# Patient Record
Sex: Female | Born: 1935
Health system: Southern US, Community
[De-identification: ages and names within clinical notes are randomized; demographics above are authoritative.]

## PROBLEM LIST (undated history)

## (undated) DIAGNOSIS — E785 Hyperlipidemia, unspecified: Secondary | ICD-10-CM

## (undated) DIAGNOSIS — R351 Nocturia: Secondary | ICD-10-CM

## (undated) DIAGNOSIS — E039 Hypothyroidism, unspecified: Secondary | ICD-10-CM

## (undated) DIAGNOSIS — Z973 Presence of spectacles and contact lenses: Secondary | ICD-10-CM

## (undated) DIAGNOSIS — C541 Malignant neoplasm of endometrium: Secondary | ICD-10-CM

## (undated) DIAGNOSIS — F419 Anxiety disorder, unspecified: Secondary | ICD-10-CM

## (undated) DIAGNOSIS — Z972 Presence of dental prosthetic device (complete) (partial): Secondary | ICD-10-CM

## (undated) DIAGNOSIS — Z923 Personal history of irradiation: Secondary | ICD-10-CM

## (undated) DIAGNOSIS — M51379 Other intervertebral disc degeneration, lumbosacral region without mention of lumbar back pain or lower extremity pain: Secondary | ICD-10-CM

## (undated) DIAGNOSIS — F32A Depression, unspecified: Secondary | ICD-10-CM

## (undated) DIAGNOSIS — F329 Major depressive disorder, single episode, unspecified: Secondary | ICD-10-CM

## (undated) DIAGNOSIS — M5137 Other intervertebral disc degeneration, lumbosacral region: Secondary | ICD-10-CM

## (undated) DIAGNOSIS — M1990 Unspecified osteoarthritis, unspecified site: Secondary | ICD-10-CM

## (undated) DIAGNOSIS — Z8719 Personal history of other diseases of the digestive system: Secondary | ICD-10-CM

## (undated) HISTORY — PX: INCISIONAL HERNIA REPAIR: SHX193

## (undated) HISTORY — DX: Personal history of irradiation: Z92.3

## (undated) HISTORY — PX: LAPAROSCOPIC CHOLECYSTECTOMY: SUR755

## (undated) HISTORY — PX: KNEE ARTHROSCOPY W/ MENISCAL REPAIR: SHX1877

## (undated) HISTORY — PX: CATARACT EXTRACTION W/ INTRAOCULAR LENS  IMPLANT, BILATERAL: SHX1307

---

## 1997-10-31 ENCOUNTER — Other Ambulatory Visit: Admission: RE | Admit: 1997-10-31 | Discharge: 1997-10-31 | Payer: Self-pay | Admitting: Endocrinology

## 1999-09-23 ENCOUNTER — Other Ambulatory Visit: Admission: RE | Admit: 1999-09-23 | Discharge: 1999-09-23 | Payer: Self-pay | Admitting: Endocrinology

## 2001-01-27 ENCOUNTER — Ambulatory Visit (HOSPITAL_COMMUNITY): Admission: RE | Admit: 2001-01-27 | Discharge: 2001-01-27 | Payer: Self-pay | Admitting: Gastroenterology

## 2001-03-01 ENCOUNTER — Encounter: Admission: RE | Admit: 2001-03-01 | Discharge: 2001-03-01 | Payer: Self-pay | Admitting: Surgery

## 2001-03-01 ENCOUNTER — Encounter: Payer: Self-pay | Admitting: Surgery

## 2001-03-02 ENCOUNTER — Ambulatory Visit (HOSPITAL_BASED_OUTPATIENT_CLINIC_OR_DEPARTMENT_OTHER): Admission: RE | Admit: 2001-03-02 | Discharge: 2001-03-02 | Payer: Self-pay | Admitting: Surgery

## 2007-04-16 ENCOUNTER — Encounter: Payer: Self-pay | Admitting: Pulmonary Disease

## 2007-05-12 ENCOUNTER — Ambulatory Visit: Payer: Self-pay | Admitting: Pulmonary Disease

## 2007-05-27 ENCOUNTER — Ambulatory Visit: Payer: Self-pay | Admitting: Pulmonary Disease

## 2007-05-27 DIAGNOSIS — R930 Abnormal findings on diagnostic imaging of skull and head, not elsewhere classified: Secondary | ICD-10-CM | POA: Insufficient documentation

## 2010-02-07 NOTE — Assessment & Plan Note (Signed)
Summary: consult for abnormal chest ct.   Referred by:  Evlyn Kanner PCP:  Ardyth Harps   History of Present Illness: the patient is a very pleasant 77 year old female who I have been asked to see for an abnormal chest CT.  Patient states that she had an episode of pneumonia the end of March/beginning of April and was treated appropriately with antibiotics.  She had a significant improvement in her condition, and has subsequently returned to her normal baseline.  During this time, the patient underwent a CT scan of the chest which did show cardiomegaly, as well as small pleural based densities that were primarily linear in nature.  Their appearance seemed most consistent with a scar.  The patient currently has no significant cough or mucus production.  She is very satisfied with her exercise tolerance.  She denies any history of occupational exposures, nor has she ever lived in the [place] for any significant time period.  She has never smoked.     Current Allergies: ! CODEINE ! SULFA ! ADHESIVE TAPE ! * LATEX  Past Medical History:    status post cholecystectomy    history of hernia surgery   Family History:    Reviewed history from 05/12/2007 and no changes required:       heart disease: father (MI), brother (stroke), mother (stroke)  Social History:    Reviewed history from 05/12/2007 and no changes required:       Patient never smoked.        pt is married.       pt is retired from [employer]   Risk Factors: Tobacco use:  never   Review of Systems      See HPI   Vital Signs:  Patient Profile:   77 Years Old Female Weight:      172.38 pounds O2 Sat:      97 % O2 treatment:    Room Air Temp:     97.6 degrees F oral Pulse rate:   67 / minute BP sitting:   112 / 78  (left arm) Cuff size:   regular  Vitals Entered By: Cyndia Diver LPN (May 27, 2007 1:44 PM)             Comments Medications reviewed with patient Cyndia Diver LPN  May 27, 2007 1:44 PM       Physical Exam  General:     well developed female in no acute distress Eyes:     slightly irregular,EOMI.   Nose:     patent without discharge Mouth:     clear Neck:     no JVD, thyromegaly, or lymphadenopathy. Lungs:     totally clear to auscultation Heart:     regular rate and rhythm, no MRG Abdomen:     soft and nontender, bowel sounds present Extremities:     no significant edema, pulses intact distally Neurologic:     alert and oriented, moves all 4 extremities.     Impression & Recommendations:  Problem # 1:  CT, CHEST, ABNORMAL (ICD-793.1) the patient has very minimal changes in a few areas of both lung fields, and they are most compatible with scarring.  I do not see any definite nodules or infiltrates.  There is no lymphadenopathy.  The patient has no residual pulmonary symptoms, and feels that her breathing is at an excellent baseline.  At this point, I would not be concerned about her CAT scan, and would not follow up unless her clinical situation  changes.   Patient Instructions: 1)  no new recommendations. 2)  Follow-up p.r.n.   ]

## 2010-04-28 ENCOUNTER — Emergency Department (HOSPITAL_COMMUNITY)
Admission: EM | Admit: 2010-04-28 | Discharge: 2010-04-28 | Disposition: A | Discharge status: Home / Self Care | Payer: Medicare Other | Attending: Emergency Medicine | Admitting: Emergency Medicine

## 2010-04-28 DIAGNOSIS — E039 Hypothyroidism, unspecified: Secondary | ICD-10-CM | POA: Insufficient documentation

## 2010-04-28 DIAGNOSIS — R509 Fever, unspecified: Secondary | ICD-10-CM | POA: Insufficient documentation

## 2010-04-28 DIAGNOSIS — N390 Urinary tract infection, site not specified: Secondary | ICD-10-CM | POA: Insufficient documentation

## 2010-04-28 LAB — DIFFERENTIAL
Basophils Absolute: 0 10*3/uL (ref 0.0–0.1)
Basophils Relative: 0 % (ref 0–1)
Eosinophils Absolute: 0 10*3/uL (ref 0.0–0.7)
Eosinophils Relative: 0 % (ref 0–5)
Lymphocytes Relative: 4 % — ABNORMAL LOW (ref 12–46)
Lymphs Abs: 0.4 10*3/uL — ABNORMAL LOW (ref 0.7–4.0)
Monocytes Absolute: 0.7 10*3/uL (ref 0.1–1.0)
Monocytes Relative: 6 % (ref 3–12)
Neutro Abs: 10.2 10*3/uL — ABNORMAL HIGH (ref 1.7–7.7)
Neutrophils Relative %: 90 % — ABNORMAL HIGH (ref 43–77)

## 2010-04-28 LAB — COMPREHENSIVE METABOLIC PANEL
ALT: 26 U/L (ref 0–35)
AST: 40 U/L — ABNORMAL HIGH (ref 0–37)
Albumin: 3.4 g/dL — ABNORMAL LOW (ref 3.5–5.2)
Alkaline Phosphatase: 48 U/L (ref 39–117)
BUN: 11 mg/dL (ref 6–23)
CO2: 28 mEq/L (ref 19–32)
Calcium: 8.7 mg/dL (ref 8.4–10.5)
Chloride: 103 mEq/L (ref 96–112)
Creatinine, Ser: 0.96 mg/dL (ref 0.4–1.2)
GFR calc Af Amer: >60 mL/min (ref >60)
GFR calc non Af Amer: 56 mL/min — ABNORMAL LOW (ref >60)
Glucose, Bld: 169 mg/dL — ABNORMAL HIGH (ref 70–99)
Potassium: 3.8 mEq/L (ref 3.5–5.1)
Sodium: 139 mEq/L (ref 135–145)
Total Bilirubin: 0.9 mg/dL (ref 0.3–1.2)
Total Protein: 6.4 g/dL (ref 6.0–8.3)

## 2010-04-28 LAB — URINALYSIS, ROUTINE W REFLEX MICROSCOPIC
Bilirubin Urine: NEGATIVE
Glucose, UA: NEGATIVE mg/dL
Ketones, ur: NEGATIVE mg/dL
Nitrite: POSITIVE — AB
Protein, ur: 30 mg/dL — AB
Specific Gravity, Urine: 1.015 (ref 1.005–1.030)
Urobilinogen, UA: 0.2 mg/dL (ref 0.0–1.0)
pH: 6 (ref 5.0–8.0)

## 2010-04-28 LAB — CBC
HCT: 36 % (ref 36.0–46.0)
Hemoglobin: 12.2 g/dL (ref 12.0–15.0)
MCH: 31.5 pg (ref 26.0–34.0)
MCHC: 33.9 g/dL (ref 30.0–36.0)
MCV: 93 fL (ref 78.0–100.0)
Platelets: 125 10*3/uL — ABNORMAL LOW (ref 150–400)
RBC: 3.87 MIL/uL (ref 3.87–5.11)
RDW: 13.2 % (ref 11.5–15.5)
WBC: 11.4 10*3/uL — ABNORMAL HIGH (ref 4.0–10.5)

## 2010-04-28 LAB — URINE MICROSCOPIC-ADD ON

## 2010-05-01 LAB — URINE CULTURE
Colony Count: >=100000
Culture  Setup Time: 201204222148

## 2010-05-24 NOTE — Procedures (Signed)
[place]. [facility]  Patient:    Traci Richardson, Traci Richardson Visit Number: [id number] MRN: [id number]          Service Type: END Location: ENDO Attending Physician:  Charna Elizabeth Dictated by:   Anselmo Rod, M.D. Proc. Date: 01/27/01 Admit Date:  01/27/2001   CC:         [name], M.D.   Procedure Report  DATE OF BIRTH:  1925-03-13  REFERRING PHYSICIAN:  Tera Mater. Evlyn Kanner, M.D.  PROCEDURE PERFORMED:  Screening colonoscopy.  ENDOSCOPIST:  Anselmo Rod, M.D.  INSTRUMENT USED:  Olympus pediatric video colonoscope.  INDICATIONS FOR PROCEDURE:  The patient is a 77 year old white female undergoing colorectal cancer screening.  Rule out colonic polyps, masses, hemorrhoids, etc.  PREPROCEDURE PREPARATION:  Informed consent was procured from the patient. The patient was fasted for eight hours prior to the procedure and prepped with a bottle of magnesium citrate and a gallon of NuLytely the night prior to the procedure.  PREPROCEDURE PHYSICAL:  The patient had stable vital signs.  Neck supple. Chest clear to auscultation.  S1, S2 regular.  Abdomen soft with normal bowel sounds.  DESCRIPTION OF PROCEDURE:  The patient was placed in the left lateral decubitus position and sedated with 50 mg of Demerol and 5 mg of Versed intravenously.  Once the patient was adequately sedated and maintained on low-flow oxygen and continuous cardiac monitoring, the Olympus video colonoscope was advanced from the rectum to the cecum without difficulty. Except for small nonbleeding internal and external hemorrhoids, no other abnormalities were noted.  The procedure was completed up to cecum, the ileocecal valve and the appendicular orifice were clearly visualized and photographed.  No masses, polyps, erosions, ulcerations or diverticula were appreciated.  IMPRESSION:  Healthy-appearing colon up to the cecum except for small nonbleeding internal and external  hemorrhoids.  RECOMMENDATIONS: 1. High fiber diet. 2. Repeat colorectal cancer screening in the next five to 10 years unless    the patient were to develop any abnormal symptoms in the interim. 3. Outpatient follow-up on a p.r.n. basis.Dictated by:   Anselmo Rod, M.D.  Attending Physician:  Charna Elizabeth DD:  01/27/01 TD:  01/27/01 Job: [id number]

## 2010-05-24 NOTE — Op Note (Signed)
[place]. [facility]  Patient:    Traci Richardson, Traci Richardson Visit Number: [id number] MRN: [id number]          Service Type: DSU Location: [facility] Attending Physician:  Shelly Rubenstein Dictated by:   Abigail Miyamoto, M.D. Proc. Date: 03/02/01 Admit Date:  03/02/2001                             Operative Report  PREOPERATIVE DIAGNOSIS:  Incisional hernia.  POSTOPERATIVE DIAGNOSIS:  Incisional hernia.  OPERATION PERFORMED:  Incisional hernia repair with mesh.  SURGEON:  Abigail Miyamoto, M.D.  ANESTHESIA:  General endotracheal and 1% lidocaine.  ESTIMATED BLOOD LOSS:  Minimal.  INDICATIONS FOR PROCEDURE:  Traci Richardson is a 77 year old female who presents with a small hernia below the umbilicus.  She has had a previous incision here from laparoscopy.  Decision was made to proceed with the incisional hernia repair.  DESCRIPTION OF PROCEDURE:  The patient was brought to the operating room and identified as Traci Richardson.  She was placed supine on the operating table and anesthesia was induced.  Her abdomen was then prepped and draped in the usual sterile fashion.  The skin underlying the umbilicus was anesthetized with 1% lidocaine.  A small vertical incision was made below the umbilicus through the patients previous scar.  Incision was taken down to the hernia sac which was easily identified.  The hernia sac was then completely excised.  The fascia was then examined and another hernia was detected just above this defect at the area of the umbilicus.  The skin incision therefore had to be taken above the umbilicus.  The small bridge of fascia connecting the two hernia defects was then transected.  The larger defect was then identified.  There were no adhesions to the hernia defect.  This piece of medium-sized piece of Ventralex mesh which was 6.4 cm circle was brought onto the field.  The mesh was placed through the hernia defect and then sewn in place in four  different locations to the ring of the mesh with 0 Ethibond sutures.  The fascia was then closed over the top of the mesh with interrupted 0 Ethibond sutures as well.  Good coverage of the hernia defect with the mesh and closure of the fascia was achieved.  The wound was then irrigated with saline.  Again it was anesthetized with 1% lidocaine.  The subcutaneous layer was then closed with interrupted 3-0 Vicryl sutures and the skin was closed with running 4-0 Vicryl suture.  Steri-Strips, gauze and Tegaderm were then applied.  The patient tolerated the procedure well.  Sponge, needle and instrument counts were correct at the end of the procedure.  The patient was then extubated in the operating room and taken in stable condition to the recovery room. Dictated by:   Abigail Miyamoto, M.D. Attending Physician:  Shelly Rubenstein DD:  03/02/01 TD:  03/02/01 Job: [id number]

## 2011-01-15 DIAGNOSIS — M1710 Unilateral primary osteoarthritis, unspecified knee: Secondary | ICD-10-CM | POA: Diagnosis not present

## 2011-01-15 DIAGNOSIS — IMO0002 Reserved for concepts with insufficient information to code with codable children: Secondary | ICD-10-CM | POA: Diagnosis not present

## 2011-01-23 DIAGNOSIS — IMO0002 Reserved for concepts with insufficient information to code with codable children: Secondary | ICD-10-CM | POA: Diagnosis not present

## 2011-01-29 DIAGNOSIS — IMO0002 Reserved for concepts with insufficient information to code with codable children: Secondary | ICD-10-CM | POA: Diagnosis not present

## 2011-01-31 ENCOUNTER — Emergency Department (HOSPITAL_COMMUNITY)
Admission: EM | Admit: 2011-01-31 | Discharge: 2011-01-31 | Disposition: A | Discharge status: Home / Self Care | Payer: Medicare Other | Attending: Emergency Medicine | Admitting: Emergency Medicine

## 2011-01-31 ENCOUNTER — Encounter (HOSPITAL_COMMUNITY): Payer: Self-pay | Admitting: *Deleted

## 2011-01-31 DIAGNOSIS — M79609 Pain in unspecified limb: Secondary | ICD-10-CM | POA: Diagnosis not present

## 2011-01-31 DIAGNOSIS — M7989 Other specified soft tissue disorders: Secondary | ICD-10-CM

## 2011-01-31 DIAGNOSIS — E039 Hypothyroidism, unspecified: Secondary | ICD-10-CM | POA: Diagnosis not present

## 2011-01-31 DIAGNOSIS — I119 Hypertensive heart disease without heart failure: Secondary | ICD-10-CM | POA: Diagnosis not present

## 2011-01-31 HISTORY — DX: Hypothyroidism, unspecified: E03.9

## 2011-01-31 LAB — DIFFERENTIAL
Basophils Absolute: 0 10*3/uL (ref 0.0–0.1)
Basophils Relative: 1 % (ref 0–1)
Eosinophils Absolute: 0.3 10*3/uL (ref 0.0–0.7)
Eosinophils Relative: 4 % (ref 0–5)
Lymphocytes Relative: 25 % (ref 12–46)
Lymphs Abs: 1.7 10*3/uL (ref 0.7–4.0)
Monocytes Absolute: 0.5 10*3/uL (ref 0.1–1.0)
Monocytes Relative: 7 % (ref 3–12)
Neutro Abs: 4.2 10*3/uL (ref 1.7–7.7)
Neutrophils Relative %: 64 % (ref 43–77)

## 2011-01-31 LAB — CBC
HCT: 35.7 % — ABNORMAL LOW (ref 36.0–46.0)
Hemoglobin: 12 g/dL (ref 12.0–15.0)
MCH: 31.1 pg (ref 26.0–34.0)
MCHC: 33.6 g/dL (ref 30.0–36.0)
MCV: 92.5 fL (ref 78.0–100.0)
Platelets: 151 10*3/uL (ref 150–400)
RBC: 3.86 MIL/uL — ABNORMAL LOW (ref 3.87–5.11)
RDW: 13.2 % (ref 11.5–15.5)
WBC: 6.6 10*3/uL (ref 4.0–10.5)

## 2011-01-31 LAB — PROTIME-INR
INR: 1 (ref 0.00–1.49)
Prothrombin Time: 13.4 seconds (ref 11.6–15.2)

## 2011-01-31 MED ORDER — ENOXAPARIN SODIUM 80 MG/0.8ML ~~LOC~~ SOLN
1.0000 mg/kg | Freq: Once | SUBCUTANEOUS | Status: AC
  Administered 2011-01-31: 23:00:00 via SUBCUTANEOUS
  Filled 2011-01-31: qty 0.8

## 2011-01-31 NOTE — ED Provider Notes (Signed)
History     CSN: [id number]  Arrival date & time 01/31/11  1850   First MD Initiated Contact with Patient 01/31/11 2034      Chief Complaint  Patient presents with  . Leg Pain     HPI Pt reports L knee pain x 3 weeks, has seen Dr. Jeannetta Ellis PA and was given prednisone. She also reports having an MRI done Wednesday. Pt reports pain is radiating down to her Leg and noticed swelling today. Pt also reports a small round reddened area on her LLE.   Past Medical History  Diagnosis Date  . Hypothyroidism     History reviewed. No pertinent past surgical history.  No family history on file.  History  Substance Use Topics  . Smoking status: Never Smoker   . Smokeless tobacco: Not on file  . Alcohol Use: No    OB History    Grav Para Term Preterm Abortions TAB SAB Ect Mult Living                  Review of Systems Review of systems negative except as noted in history of present illness Allergies  Sulfonamide derivatives; Codeine; and Latex  Home Medications   Current Outpatient Rx  Name Route Sig Dispense Refill  . EZETIMIBE-SIMVASTATIN 10-40 MG PO TABS Oral Take 1 tablet by mouth at bedtime.    Marland Kitchen LEVOTHYROXINE SODIUM 88 MCG PO TABS Oral Take 88 mcg by mouth daily.    . ADULT MULTIVITAMIN W/MINERALS CH Oral Take 1 tablet by mouth daily.    Marland Kitchen NAPROXEN SODIUM 220 MG PO TABS Oral Take 220 mg by mouth 2 (two) times daily with a meal.    . TRAMADOL HCL 50 MG PO TABS Oral Take 50 mg by mouth every 4 (four) hours as needed. pain      BP 135/53  Pulse 64  Temp(Src) 98.6 F (37 C) (Oral)  Resp 16  Ht 5\' 3"  (1.6 m)  Wt 170 lb (77.111 kg)  BMI 30.11 kg/m2  SpO2 96%  Physical Exam  Nursing note and vitals reviewed. Constitutional: She is oriented to person, place, and time. She appears well-developed and well-nourished. No distress.  HENT:  Head: Normocephalic and atraumatic.  Eyes: Pupils are equal, round, and reactive to light.  Neck: Normal range of motion.    Cardiovascular: Normal rate and intact distal pulses.   Pulmonary/Chest: No respiratory distress.  Abdominal: Normal appearance. She exhibits no distension.  Musculoskeletal: Normal range of motion.       Left upper leg: She exhibits swelling.  Neurological: She is alert and oriented to person, place, and time. No cranial nerve deficit.  Skin: Skin is warm and dry. No rash noted.  Psychiatric: She has a normal mood and affect. Her behavior is normal.    ED Course  Procedures (including critical care time)  Labs Reviewed  CBC - Abnormal; Notable for the following:    RBC 3.86 (*)    HCT 35.7 (*)    All other components within normal limits  DIFFERENTIAL  PROTIME-INR   No results found.   1. Leg swelling       MDM   Plan at this time is to give the patient injection of Lovenox and bring her back tomorrow for a venous Doppler ultrasound of the leg.  If the venous Doppler is negative and the swelling is most likely secondary to her knee problem.  If positive she should be treated accordingly.  Nelia Shi, MD 01/31/11 2253

## 2011-01-31 NOTE — ED Notes (Signed)
Pt reports L knee pain x 3 weeks, has seen Dr. Jeannetta Ellis PA and was given prednisone.  She also reports having an MRI done Wednesday.  Pt reports pain is radiating down to her Leg and noticed swelling today.  Pt also reports a small round reddened area on her LLE.

## 2011-02-01 ENCOUNTER — Ambulatory Visit (HOSPITAL_COMMUNITY)
Admission: RE | Admit: 2011-02-01 | Discharge: 2011-02-01 | Disposition: A | Discharge status: Home / Self Care | Payer: Medicare Other | Source: Ambulatory Visit | Attending: Emergency Medicine | Admitting: Emergency Medicine

## 2011-02-01 DIAGNOSIS — M79609 Pain in unspecified limb: Secondary | ICD-10-CM | POA: Diagnosis not present

## 2011-02-01 DIAGNOSIS — M7989 Other specified soft tissue disorders: Secondary | ICD-10-CM | POA: Diagnosis not present

## 2011-02-01 NOTE — Progress Notes (Signed)
VASCULAR LAB PRELIMINARY  PRELIMINARY  PRELIMINARY  PRELIMINARY  Left lower extremity venous duplex completed.    Preliminary report:  Left:  No evidence of DVT or superficial thrombosis.  Small Baker's cyst noted in popliteal fossa measuring approx 2.4 x 1.8 x 1.6 cm.  Interstitial fluid also noted in calf.   Terance Hart, RVT 02/01/2011, 10:59 AM

## 2011-02-03 DIAGNOSIS — IMO0002 Reserved for concepts with insufficient information to code with codable children: Secondary | ICD-10-CM | POA: Diagnosis not present

## 2011-02-14 DIAGNOSIS — M23329 Other meniscus derangements, posterior horn of medial meniscus, unspecified knee: Secondary | ICD-10-CM | POA: Diagnosis not present

## 2011-02-14 DIAGNOSIS — M1710 Unilateral primary osteoarthritis, unspecified knee: Secondary | ICD-10-CM | POA: Diagnosis not present

## 2011-02-14 DIAGNOSIS — M2240 Chondromalacia patellae, unspecified knee: Secondary | ICD-10-CM | POA: Diagnosis not present

## 2011-02-14 DIAGNOSIS — M23305 Other meniscus derangements, unspecified medial meniscus, unspecified knee: Secondary | ICD-10-CM | POA: Diagnosis not present

## 2011-02-14 DIAGNOSIS — M659 Synovitis and tenosynovitis, unspecified: Secondary | ICD-10-CM | POA: Diagnosis not present

## 2011-02-14 DIAGNOSIS — M9420 Chondromalacia, unspecified site: Secondary | ICD-10-CM | POA: Diagnosis not present

## 2011-03-17 DIAGNOSIS — L820 Inflamed seborrheic keratosis: Secondary | ICD-10-CM | POA: Diagnosis not present

## 2011-03-17 DIAGNOSIS — L821 Other seborrheic keratosis: Secondary | ICD-10-CM | POA: Diagnosis not present

## 2011-03-17 DIAGNOSIS — L723 Sebaceous cyst: Secondary | ICD-10-CM | POA: Diagnosis not present

## 2011-03-19 ENCOUNTER — Ambulatory Visit (HOSPITAL_COMMUNITY)
Admission: RE | Admit: 2011-03-19 | Discharge: 2011-03-19 | Disposition: A | Discharge status: Home / Self Care | Payer: Medicare Other | Source: Ambulatory Visit | Attending: Orthopedic Surgery | Admitting: Orthopedic Surgery

## 2011-03-19 DIAGNOSIS — M7989 Other specified soft tissue disorders: Secondary | ICD-10-CM | POA: Insufficient documentation

## 2011-03-19 DIAGNOSIS — M79609 Pain in unspecified limb: Secondary | ICD-10-CM | POA: Insufficient documentation

## 2011-03-19 DIAGNOSIS — M79604 Pain in right leg: Secondary | ICD-10-CM

## 2011-03-19 NOTE — Progress Notes (Signed)
Left lower extremity venous duplex completed.  Preliminary report is negative for DVT, SVT, or a Baker's cyst in the left leg.  Negative for DVT in the right common femoral vein. 

## 2011-03-20 DIAGNOSIS — M1710 Unilateral primary osteoarthritis, unspecified knee: Secondary | ICD-10-CM | POA: Diagnosis not present

## 2011-03-20 DIAGNOSIS — IMO0002 Reserved for concepts with insufficient information to code with codable children: Secondary | ICD-10-CM | POA: Diagnosis not present

## 2011-03-25 DIAGNOSIS — IMO0002 Reserved for concepts with insufficient information to code with codable children: Secondary | ICD-10-CM | POA: Diagnosis not present

## 2011-03-25 DIAGNOSIS — M1710 Unilateral primary osteoarthritis, unspecified knee: Secondary | ICD-10-CM | POA: Diagnosis not present

## 2011-04-01 DIAGNOSIS — IMO0002 Reserved for concepts with insufficient information to code with codable children: Secondary | ICD-10-CM | POA: Diagnosis not present

## 2011-04-01 DIAGNOSIS — M1710 Unilateral primary osteoarthritis, unspecified knee: Secondary | ICD-10-CM | POA: Diagnosis not present

## 2011-04-08 DIAGNOSIS — IMO0002 Reserved for concepts with insufficient information to code with codable children: Secondary | ICD-10-CM | POA: Diagnosis not present

## 2011-04-08 DIAGNOSIS — M1710 Unilateral primary osteoarthritis, unspecified knee: Secondary | ICD-10-CM | POA: Diagnosis not present

## 2011-04-14 DIAGNOSIS — IMO0002 Reserved for concepts with insufficient information to code with codable children: Secondary | ICD-10-CM | POA: Diagnosis not present

## 2011-04-14 DIAGNOSIS — M1710 Unilateral primary osteoarthritis, unspecified knee: Secondary | ICD-10-CM | POA: Diagnosis not present

## 2011-04-21 DIAGNOSIS — E785 Hyperlipidemia, unspecified: Secondary | ICD-10-CM | POA: Diagnosis not present

## 2011-04-21 DIAGNOSIS — M1710 Unilateral primary osteoarthritis, unspecified knee: Secondary | ICD-10-CM | POA: Diagnosis not present

## 2011-04-21 DIAGNOSIS — IMO0002 Reserved for concepts with insufficient information to code with codable children: Secondary | ICD-10-CM | POA: Diagnosis not present

## 2011-04-21 DIAGNOSIS — E039 Hypothyroidism, unspecified: Secondary | ICD-10-CM | POA: Diagnosis not present

## 2011-04-21 DIAGNOSIS — E559 Vitamin D deficiency, unspecified: Secondary | ICD-10-CM | POA: Diagnosis not present

## 2011-04-27 ENCOUNTER — Encounter (HOSPITAL_COMMUNITY): Payer: Self-pay

## 2011-04-27 ENCOUNTER — Emergency Department (HOSPITAL_COMMUNITY)
Admission: EM | Admit: 2011-04-27 | Discharge: 2011-04-27 | Disposition: A | Discharge status: Home / Self Care | Payer: Medicare Other | Attending: Emergency Medicine | Admitting: Emergency Medicine

## 2011-04-27 DIAGNOSIS — K047 Periapical abscess without sinus: Secondary | ICD-10-CM | POA: Diagnosis not present

## 2011-04-27 DIAGNOSIS — R6889 Other general symptoms and signs: Secondary | ICD-10-CM | POA: Insufficient documentation

## 2011-04-27 DIAGNOSIS — E039 Hypothyroidism, unspecified: Secondary | ICD-10-CM | POA: Insufficient documentation

## 2011-04-27 DIAGNOSIS — K029 Dental caries, unspecified: Secondary | ICD-10-CM | POA: Diagnosis not present

## 2011-04-27 LAB — URINALYSIS, ROUTINE W REFLEX MICROSCOPIC
Bilirubin Urine: NEGATIVE
Glucose, UA: NEGATIVE mg/dL
Hgb urine dipstick: NEGATIVE
Ketones, ur: NEGATIVE mg/dL
Leukocytes, UA: NEGATIVE
Nitrite: NEGATIVE
Protein, ur: NEGATIVE mg/dL
Specific Gravity, Urine: 1.022 (ref 1.005–1.030)
Urobilinogen, UA: 0.2 mg/dL (ref 0.0–1.0)
pH: 6 (ref 5.0–8.0)

## 2011-04-27 MED ORDER — IBUPROFEN 200 MG PO TABS
400.0000 mg | ORAL_TABLET | Freq: Once | ORAL | Status: AC
  Administered 2011-04-27: 400 mg via ORAL
  Filled 2011-04-27: qty 2

## 2011-04-27 NOTE — ED Provider Notes (Signed)
History     CSN: [id number]  Arrival date & time 04/27/11  1804   First MD Initiated Contact with Patient 04/27/11 1907      Chief Complaint  Patient presents with  . Dental Pain    (Consider location/radiation/quality/duration/timing/severity/associated sxs/prior treatment) Patient is a 78 y.o. female presenting with tooth pain. The history is provided by the patient and a relative.  Dental Pain  Patient complaining of dental pain to the right lower jaw x24 hours. Called her dentist and she was prescribed amoxicillin and the pain remains however the right lower jaw swelling has improved. She now has a low-grade temperature has been using Tylenol with some relief. Family brings her in today concerned about the increased temperature as well as for possible concurrent UTI. Patient notes some urinary urgency and frequency, denies flank pain or vomiting Past Medical History  Diagnosis Date  . Hypothyroidism     History reviewed. No pertinent past surgical history.  No family history on file.  History  Substance Use Topics  . Smoking status: Never Smoker   . Smokeless tobacco: Not on file  . Alcohol Use: No    OB History    Grav Para Term Preterm Abortions TAB SAB Ect Mult Living                  Review of Systems  All other systems reviewed and are negative.    Allergies  Sulfonamide derivatives; Codeine; and Latex  Home Medications   Current Outpatient Rx  Name Route Sig Dispense Refill  . ACETAMINOPHEN 500 MG PO TABS Oral Take 1,000 mg by mouth every 6 (six) hours as needed. pain    . AMOXICILLIN 500 MG PO CAPS Oral Take 500 mg by mouth 4 (four) times daily.    Marland Kitchen EZETIMIBE-SIMVASTATIN 10-40 MG PO TABS Oral Take 1 tablet by mouth at bedtime.    Marland Kitchen LEVOTHYROXINE SODIUM 88 MCG PO TABS Oral Take 88 mcg by mouth daily.    . ADULT MULTIVITAMIN W/MINERALS CH Oral Take 1 tablet by mouth daily.    . TRAMADOL HCL 50 MG PO TABS Oral Take 50 mg by mouth every 4 (four) hours  as needed. pain      BP 128/71  Pulse 73  Temp(Src) 100.2 F (37.9 C) (Oral)  Resp 16  SpO2 99%  Physical Exam  Nursing note and vitals reviewed. Constitutional: She is oriented to person, place, and time. She appears well-developed and well-nourished.  Non-toxic appearance. No distress.  HENT:  Head: Normocephalic and atraumatic.  Mouth/Throat: Dental caries present.  Eyes: Conjunctivae, EOM and lids are normal. Pupils are equal, round, and reactive to light.  Neck: Normal range of motion. Neck supple. No tracheal deviation present. No mass present.  Cardiovascular: Normal rate, regular rhythm and normal heart sounds.  Exam reveals no gallop.   No murmur heard. Pulmonary/Chest: Effort normal and breath sounds normal. No stridor. No respiratory distress. She has no decreased breath sounds. She has no wheezes. She has no rhonchi. She has no rales.  Abdominal: Soft. Normal appearance and bowel sounds are normal. She exhibits no distension. There is no tenderness. There is no rebound and no CVA tenderness.  Musculoskeletal: Normal range of motion. She exhibits no edema and no tenderness.  Neurological: She is alert and oriented to person, place, and time. She has normal strength. No cranial nerve deficit or sensory deficit. GCS eye subscore is 4. GCS verbal subscore is 5. GCS motor subscore is 6.  Skin: Skin is warm and dry. No abrasion and no rash noted.  Psychiatric: She has a normal mood and affect. Her speech is normal and behavior is normal.    ED Course  Procedures (including critical care time)   Labs Reviewed  URINALYSIS, ROUTINE W REFLEX MICROSCOPIC  URINE CULTURE   No results found.   No diagnosis found.    MDM  Patient given Motrin for her temperature and was much better now. Urinalysis negative. Patient will see her dentist tomorrow morning. Patient's jaw swelling and facial erythema had greatly improved according to the family therefore her antibiotics would not  be changed        Toy Baker, MD 04/27/11 2150

## 2011-04-27 NOTE — ED Notes (Signed)
Patient given discharge instructions, information, prescriptions, and diet order. Patient states that they adequately understand discharge information given and to return to ED if symptoms return or worsen.     

## 2011-04-27 NOTE — ED Notes (Signed)
Pt in from home with c/o dental pain currently taking antibiotic prescribed by pcp states pain and discomfort is no better pt c/o lower right tooth abscess

## 2011-04-27 NOTE — Discharge Instructions (Signed)
Follow up with your dentist tomorrow. Return here at once for trouble swallowing, vomiting, worsening fever, or any other problems Abscessed Tooth A tooth abscess is a collection of infected fluid (pus) from a bacterial infection in the inner part of the tooth (pulp). It usually occurs at the end of the tooth's root.  CAUSES   A very bad cavity (extensive tooth decay).   Trauma to the tooth, such as a broken or chipped tooth, that allows bacteria to enter into the pulp.  SYMPTOMS  Severe pain in and around the infected tooth.   Swelling and redness around the abscessed tooth or in the mouth or face.   Tenderness.   Pus drainage.   Bad breath.   Bitter taste in the mouth.   Difficulty swallowing.   Difficulty opening the mouth.   Feeling sick to your stomach (nauseous).   Vomiting.   Chills.   Swollen neck glands.  DIAGNOSIS  A medical and dental history will be taken.   An examination will be performed by tapping on the abscessed tooth.   X-rays may be taken of the tooth to identify the abscess.  TREATMENT The goal of treatment is to eliminate the infection.   You may be prescribed antibiotic medicine to stop the infection from spreading.   A root canal may be performed to save the tooth. If the tooth cannot be saved, it may be pulled (extracted) and the abscess may be drained.  HOME CARE INSTRUCTIONS  Only take over-the-counter or prescription medicines for pain, fever, or discomfort as directed by your caregiver.   Do not drive after taking pain medicine (narcotics).   Rinse your mouth (gargle) often with salt water ( tsp salt in 8 oz of warm water) to relieve pain or swelling.   Do not apply heat to the outside of your face.   Return to your dentist for further treatment as directed.  SEEK IMMEDIATE DENTAL CARE IF:  You have a temperature by mouth above 102 F (38.9 C), not controlled by medicine.   You have chills or a very bad headache.   You have  problems breathing or swallowing.   Your have trouble opening your mouth.   You develop swelling in the neck or around the eye.   Your pain is not helped by medicine.   Your pain is getting worse instead of better.  Document Released: 12/23/2004 Document Revised: 12/12/2010 Document Reviewed: 04/02/2010 [facility] Patient Information 2012 [place], [place].

## 2011-04-27 NOTE — ED Notes (Signed)
Family also states patient has been very week with fever all day tylenol given 1000mg  at [facility]

## 2011-04-27 NOTE — ED Notes (Signed)
Bed:WA22<BR> Expected date:<BR> Expected time:<BR> Means of arrival:<BR> Comments:<BR> Hold for triage 2

## 2011-04-29 ENCOUNTER — Encounter (HOSPITAL_COMMUNITY): Payer: Self-pay | Admitting: *Deleted

## 2011-04-29 ENCOUNTER — Emergency Department (HOSPITAL_COMMUNITY): Payer: Medicare Other

## 2011-04-29 ENCOUNTER — Emergency Department (HOSPITAL_COMMUNITY)
Admission: EM | Admit: 2011-04-29 | Discharge: 2011-04-29 | Disposition: A | Discharge status: Home / Self Care | Payer: Medicare Other | Attending: Emergency Medicine | Admitting: Emergency Medicine

## 2011-04-29 DIAGNOSIS — R5383 Other fatigue: Secondary | ICD-10-CM | POA: Diagnosis not present

## 2011-04-29 DIAGNOSIS — R4182 Altered mental status, unspecified: Secondary | ICD-10-CM | POA: Diagnosis not present

## 2011-04-29 DIAGNOSIS — R6883 Chills (without fever): Secondary | ICD-10-CM | POA: Diagnosis not present

## 2011-04-29 DIAGNOSIS — F29 Unspecified psychosis not due to a substance or known physiological condition: Secondary | ICD-10-CM | POA: Diagnosis not present

## 2011-04-29 DIAGNOSIS — R5381 Other malaise: Secondary | ICD-10-CM | POA: Diagnosis not present

## 2011-04-29 DIAGNOSIS — R410 Disorientation, unspecified: Secondary | ICD-10-CM

## 2011-04-29 DIAGNOSIS — F05 Delirium due to known physiological condition: Secondary | ICD-10-CM | POA: Diagnosis not present

## 2011-04-29 DIAGNOSIS — E039 Hypothyroidism, unspecified: Secondary | ICD-10-CM | POA: Insufficient documentation

## 2011-04-29 DIAGNOSIS — I517 Cardiomegaly: Secondary | ICD-10-CM | POA: Diagnosis not present

## 2011-04-29 LAB — URINALYSIS, ROUTINE W REFLEX MICROSCOPIC
Bilirubin Urine: NEGATIVE
Glucose, UA: NEGATIVE mg/dL
Hgb urine dipstick: NEGATIVE
Ketones, ur: NEGATIVE mg/dL
Leukocytes, UA: NEGATIVE
Nitrite: NEGATIVE
Protein, ur: NEGATIVE mg/dL
Specific Gravity, Urine: 1.014 (ref 1.005–1.030)
Urobilinogen, UA: 0.2 mg/dL (ref 0.0–1.0)
pH: 6.5 (ref 5.0–8.0)

## 2011-04-29 LAB — CBC
HCT: 37.5 % (ref 36.0–46.0)
Hemoglobin: 12.5 g/dL (ref 12.0–15.0)
MCH: 31.1 pg (ref 26.0–34.0)
MCHC: 33.3 g/dL (ref 30.0–36.0)
MCV: 93.3 fL (ref 78.0–100.0)
Platelets: 178 10*3/uL (ref 150–400)
RBC: 4.02 MIL/uL (ref 3.87–5.11)
RDW: 13.6 % (ref 11.5–15.5)
WBC: 5.7 10*3/uL (ref 4.0–10.5)

## 2011-04-29 LAB — COMPREHENSIVE METABOLIC PANEL
ALT: 13 U/L (ref 0–35)
AST: 22 U/L (ref 0–37)
Albumin: 3.6 g/dL (ref 3.5–5.2)
Alkaline Phosphatase: 56 U/L (ref 39–117)
BUN: 14 mg/dL (ref 6–23)
CO2: 28 mEq/L (ref 19–32)
Calcium: 9.1 mg/dL (ref 8.4–10.5)
Chloride: 101 mEq/L (ref 96–112)
Creatinine, Ser: 0.91 mg/dL (ref 0.50–1.10)
GFR calc Af Amer: 66 mL/min — ABNORMAL LOW (ref >90)
GFR calc non Af Amer: 57 mL/min — ABNORMAL LOW (ref >90)
Glucose, Bld: 97 mg/dL (ref 70–99)
Potassium: 4.5 mEq/L (ref 3.5–5.1)
Sodium: 136 mEq/L (ref 135–145)
Total Bilirubin: 0.5 mg/dL (ref 0.3–1.2)
Total Protein: 6.8 g/dL (ref 6.0–8.3)

## 2011-04-29 LAB — DIFFERENTIAL
Basophils Absolute: 0 10*3/uL (ref 0.0–0.1)
Basophils Relative: 1 % (ref 0–1)
Eosinophils Absolute: 0.2 10*3/uL (ref 0.0–0.7)
Eosinophils Relative: 3 % (ref 0–5)
Lymphocytes Relative: 19 % (ref 12–46)
Lymphs Abs: 1.1 10*3/uL (ref 0.7–4.0)
Monocytes Absolute: 0.4 10*3/uL (ref 0.1–1.0)
Monocytes Relative: 7 % (ref 3–12)
Neutro Abs: 4 10*3/uL (ref 1.7–7.7)
Neutrophils Relative %: 71 % (ref 43–77)

## 2011-04-29 LAB — URINE CULTURE
Colony Count: NO GROWTH
Culture  Setup Time: 201304220308
Culture: NO GROWTH

## 2011-04-29 MED ORDER — SODIUM CHLORIDE 0.9 % IV SOLN
INTRAVENOUS | Status: DC
  Administered 2011-04-29: 13:00:00 via INTRAVENOUS

## 2011-04-29 MED ORDER — SODIUM CHLORIDE 0.9 % IV BOLUS (SEPSIS)
500.0000 mL | Freq: Once | INTRAVENOUS | Status: AC
  Administered 2011-04-29: 500 mL via INTRAVENOUS

## 2011-04-29 MED ORDER — ACETAMINOPHEN 325 MG PO TABS
650.0000 mg | ORAL_TABLET | Freq: Once | ORAL | Status: AC
  Administered 2011-04-29: 650 mg via ORAL
  Filled 2011-04-29: qty 2

## 2011-04-29 NOTE — Discharge Instructions (Signed)
Dr. Rinaldo Cloud office will call you in the morning to arrange a followup appointment.    They will be able to see you tomorrow.  Get plenty of rest and drink a lot of fluids.  Stop taking the tramadol.   Use Tylenol for pain  Confusion Confusion is the inability to think with your usual speed or clarity. Confusion may come on quickly or slowly over time. How quickly the confusion comes on depends on the cause. Confusion can be due to any number of causes. CAUSES   Concussion, head injury, or head trauma.   Seizures.   Stroke.   Fever.   Senility.   Heightened emotional states like rage or terror.   Mental illness in which the person loses the ability to determine what is real and what is not (hallucinations).   Infections.   Toxic effects from alcohol, drugs, or prescription medicines.   Dehydration and an imbalance of salts in the body (electrolytes).   Lack of sleep.   Low blood sugar (diabetes).   Low levels of oxygen (for example from chronic lung disorders).   Drug interactions or other medication side effects.   Nutritional deficiencies, especially niacin, thiamine, vitamin C, or vitamin B.   Sudden drop in body temperature (hypothermia).   Illness in the elderly. Constipation can result in confusion. An elderly person who is hospitalized may become confused due to change in daily routine.  SYMPTOMS  People often describe their thinking as cloudy or unclear when they are confused. Confusion can also include feeling disoriented. That means you are unaware of where or who you are. You may also not know what the date or time is. If confused, you may also have difficulty paying attention, remembering and making decisions. Some people also act aggressively when they are confused.  DIAGNOSIS  The medical evaluation of confusion may include:  Blood and urine tests.   X-rays.   Brain and nervous system tests.   Analyzing your brain waves (electroencphalogram  or EEG).   A special X-ray (MRI) of your head or other special studies.  Your physician will ask questions such as:  Do you get days and nights mixed up?   Are you awake during regular sleep times?   Do you have trouble recognizing people?   Do you know where you are?   Do you know the date and time?   Does the confusion come and go?   Is the confusion quickly getting worse?   Has there been a recent illness?   Has there been a recent head injury?   Are you diabetic?   Do you have a lung disorder?   What medication are you taking?   Have you taken drugs or alcohol?  TREATMENT  An admission to the hospital may not be needed, but a confused person should not be left alone. Stay with a family member or friend until the confusion clears. Avoid alcohol, pain relievers or sedative drugs until you have fully recovered. Do not drive until your caregiver says it is okay. HOME CARE INSTRUCTIONS What family and friends can do:  To find out if someone is confused ask him or her their name, age, and the date. If the person is unsure or answers incorrectly, he or she is confused.   Always introduce yourself, no matter how well the person knows you.   Often remind the person of his or her location.   Place a calendar and clock near the confused person.  Talk about current events and plans for the day.   Try to keep the environment calm, quiet and peaceful.   Make sure the patient keeps follow up appointments with their physician.  PREVENTION  Ways to prevent confusion:  Avoid alcohol.   Eat a balanced diet.   Get enough sleep.   Do not become isolated. Spend time with other people and make plans for your days.   Keep careful watch on your blood sugar levels if you are diabetic.  SEEK IMMEDIATE MEDICAL CARE IF:   You develop severe headaches, repeated vomiting, seizures, blackouts or slurred speech.   There is increasing confusion, weakness, numbness, restlessness or  personality changes.   You develop a loss of balance, have marked dizziness, feel uncoordinated or fall.   You have delusions, hallucinations or develop severe anxiety.   Your family members think you need to be rechecked.  Document Released: 01/31/2004 Document Revised: 12/12/2010 Document Reviewed: 09/28/2007 [facility] Patient Information 2012 [place], [place].

## 2011-04-29 NOTE — ED Provider Notes (Addendum)
History     CSN: [id number]  Arrival date & time 04/29/11  1147   First MD Initiated Contact with Patient 04/29/11 1204      Chief Complaint  Patient presents with  . Altered Mental Status    (Consider location/radiation/quality/duration/timing/severity/associated sxs/prior treatment) HPI Comments: Traci Richardson is a 78 y.o. female who presents with family. She's been more confused than usual recently. She has trouble recognizing some people, and some things. She was treated for a dental infection 4 days ago. Then, yesterday she had it drained by an oral surgeon. She was put on tramadol 2 days ago. Confusion preceded the tramadol it got worse when using the tramadol. Her sister suddenly died last week, and she has been upset because of that. She has not had any trouble eating has been drinking less than usual. She is taking her usual medication. She has not had this problem previously. She is on amoxicillin for dental infection.   The history is provided by the patient and a relative.    Past Medical History  Diagnosis Date  . Hypothyroidism     History reviewed. No pertinent past surgical history.  No family history on file.  History  Substance Use Topics  . Smoking status: Never Smoker   . Smokeless tobacco: Not on file  . Alcohol Use: No    OB History    Grav Para Term Preterm Abortions TAB SAB Ect Mult Living                  Review of Systems  All other systems reviewed and are negative.    Allergies  Sulfonamide derivatives; Codeine; and Latex  Home Medications   Current Outpatient Rx  Name Route Sig Dispense Refill  . AMOXICILLIN 500 MG PO CAPS Oral Take 500 mg by mouth 4 (four) times daily.    Marland Kitchen EZETIMIBE-SIMVASTATIN 10-40 MG PO TABS Oral Take 1 tablet by mouth at bedtime.    Marland Kitchen LEVOTHYROXINE SODIUM 88 MCG PO TABS Oral Take 88 mcg by mouth daily.    . ADULT MULTIVITAMIN W/MINERALS CH Oral Take 1 tablet by mouth daily.    . TRAMADOL HCL 50 MG PO TABS  Oral Take 50 mg by mouth every 4 (four) hours as needed. pain      BP 142/80  Pulse 78  Temp(Src) 98.2 F (36.8 C) (Oral)  Resp 17  SpO2 100%  Physical Exam  Nursing note and vitals reviewed. Constitutional: She appears well-developed and well-nourished.  HENT:  Head: Normocephalic and atraumatic.       Nearly healed dental infection, adjacent to right first molar. No associated swelling or discharge. No trismus.  Eyes: Conjunctivae and EOM are normal. Pupils are equal, round, and reactive to light.  Neck: Normal range of motion and phonation normal. Neck supple.  Cardiovascular: Normal rate, regular rhythm and intact distal pulses.   Pulmonary/Chest: Effort normal and breath sounds normal. She exhibits no tenderness.  Abdominal: Soft. She exhibits no distension. There is no tenderness. There is no guarding.  Musculoskeletal: Normal range of motion.  Neurological: She is alert. She has normal strength. She exhibits normal muscle tone.       Spotty confusion for events, and date. Good recognition of simple items.  Skin: Skin is warm and dry.  Psychiatric: She has a normal mood and affect. Her behavior is normal.    ED Course  Procedures (including critical care time)   Date: 04/29/2011  Rate: 57  Rhythm: normal sinus  rhythm  QRS Axis: normal  Intervals: normal  ST/T Wave abnormalities: normal  Conduction Disutrbances:none  Narrative Interpretation:   Old EKG Reviewed: none available  The case was discussed with Dr. Evlyn Kanner. He will see the patient tomorrow.   Labs Reviewed  COMPREHENSIVE METABOLIC PANEL - Abnormal; Notable for the following:    GFR calc non Af Amer 57 (*)    GFR calc Af Amer 66 (*)    All other components within normal limits  CBC  DIFFERENTIAL  URINALYSIS, ROUTINE W REFLEX MICROSCOPIC  URINE CULTURE   Dg Chest 2 View  04/29/2011  *RADIOLOGY REPORT*  Clinical Data: Confusion.  Chills.  CHEST - 2 VIEW  Comparison: None.  Findings: Mild to moderate  cardiomegaly is noted.  Both lungs are clear.  No evidence of pleural effusion.  Ectasia thoracic aorta is seen, but no definite mass or lymphadenopathy identified.  IMPRESSION: Cardiomegaly.  No active lung disease.  Original Report Authenticated By: Danae Orleans, M.D.   Ct Head Wo Contrast  04/29/2011  *RADIOLOGY REPORT*  Clinical Data: Altered mental status.  CT HEAD WITHOUT CONTRAST  Technique:  Contiguous axial images were obtained from the base of the skull through the vertex without contrast.  Comparison: None.  Findings: There is no evidence of acute intracranial abnormality including infarction, hemorrhage, mass lesion, mass effect, midline shift or abnormal extra-axial fluid collection is identified. There is no pneumocephalus or hydrocephalus.  The calvarium is intact.  Imaged paranasal sinuses and mastoid air cells are clear.  IMPRESSION: Negative exam.  Original Report Authenticated By: Bernadene Bell. D'ALESSIO, M.D.     1. Confusion   2. Malaise       MDM  Nonspecific malaise with mild confusion, improving with emergency department treatment of IV fluids. No apparent CVA, metabolic instability, suspected occult infection or evident medicine complication. She confusion preceding onset of general treatment. I suspect the confusion is multifactorial. She is stable for discharge with outpatient management.    Plan: Home Medications- stop tramadol; Home Treatments- increase fluids at home; Recommended follow up- PCP tomorrow       Flint Melter, MD 04/29/11 1706  Flint Melter, MD 04/29/11 1714

## 2011-04-29 NOTE — ED Notes (Signed)
Per pt's daughter started out with an abscess on Friday.pt daughter states pt started to have altered mental status. The family is concerned about a uti. Pt has oral surgery yesterday and was started on tramadol. Family is not sure if pt altered mental status is coming from medication or uti. Pt is alert and oriented at this time

## 2011-04-29 NOTE — ED Notes (Signed)
Daughter at bedside reports pt is presently being treated for UTI, taking abx and had oral surgery and is taking tramadol.  States she is not sure if these meds are the reason for pt's AMS.  Pt is A&Ox 4 at present and denies any pain.

## 2011-04-29 NOTE — ED Notes (Signed)
Patient transported to CT 

## 2011-04-30 DIAGNOSIS — K047 Periapical abscess without sinus: Secondary | ICD-10-CM | POA: Diagnosis not present

## 2011-04-30 DIAGNOSIS — F29 Unspecified psychosis not due to a substance or known physiological condition: Secondary | ICD-10-CM | POA: Diagnosis not present

## 2011-04-30 LAB — URINE CULTURE
Colony Count: NO GROWTH
Culture  Setup Time: 201304240044
Culture: NO GROWTH

## 2011-08-04 DIAGNOSIS — Z1231 Encounter for screening mammogram for malignant neoplasm of breast: Secondary | ICD-10-CM | POA: Diagnosis not present

## 2011-08-11 DIAGNOSIS — M1710 Unilateral primary osteoarthritis, unspecified knee: Secondary | ICD-10-CM | POA: Diagnosis not present

## 2011-08-11 DIAGNOSIS — IMO0002 Reserved for concepts with insufficient information to code with codable children: Secondary | ICD-10-CM | POA: Diagnosis not present

## 2011-12-09 DIAGNOSIS — Z23 Encounter for immunization: Secondary | ICD-10-CM | POA: Diagnosis not present

## 2011-12-22 DIAGNOSIS — N390 Urinary tract infection, site not specified: Secondary | ICD-10-CM | POA: Diagnosis not present

## 2011-12-22 DIAGNOSIS — R82998 Other abnormal findings in urine: Secondary | ICD-10-CM | POA: Diagnosis not present

## 2012-02-24 DIAGNOSIS — E039 Hypothyroidism, unspecified: Secondary | ICD-10-CM | POA: Diagnosis not present

## 2012-02-24 DIAGNOSIS — E785 Hyperlipidemia, unspecified: Secondary | ICD-10-CM | POA: Diagnosis not present

## 2012-02-24 DIAGNOSIS — E559 Vitamin D deficiency, unspecified: Secondary | ICD-10-CM | POA: Diagnosis not present

## 2012-02-24 DIAGNOSIS — N390 Urinary tract infection, site not specified: Secondary | ICD-10-CM | POA: Diagnosis not present

## 2012-03-03 DIAGNOSIS — N390 Urinary tract infection, site not specified: Secondary | ICD-10-CM | POA: Diagnosis not present

## 2012-03-06 DIAGNOSIS — Z8719 Personal history of other diseases of the digestive system: Secondary | ICD-10-CM

## 2012-03-06 HISTORY — DX: Personal history of other diseases of the digestive system: Z87.19

## 2012-03-18 ENCOUNTER — Inpatient Hospital Stay (HOSPITAL_COMMUNITY)
Admission: EM | Admit: 2012-03-18 | Discharge: 2012-03-21 | DRG: 392 | Disposition: A | Discharge status: Home / Self Care | Payer: Medicare Other | Attending: Endocrinology | Admitting: Endocrinology

## 2012-03-18 ENCOUNTER — Emergency Department (HOSPITAL_COMMUNITY): Payer: Medicare Other

## 2012-03-18 ENCOUNTER — Encounter (HOSPITAL_COMMUNITY): Payer: Self-pay | Admitting: Emergency Medicine

## 2012-03-18 DIAGNOSIS — D72829 Elevated white blood cell count, unspecified: Secondary | ICD-10-CM | POA: Diagnosis not present

## 2012-03-18 DIAGNOSIS — E8809 Other disorders of plasma-protein metabolism, not elsewhere classified: Secondary | ICD-10-CM | POA: Diagnosis present

## 2012-03-18 DIAGNOSIS — Z79899 Other long term (current) drug therapy: Secondary | ICD-10-CM | POA: Diagnosis not present

## 2012-03-18 DIAGNOSIS — E785 Hyperlipidemia, unspecified: Secondary | ICD-10-CM | POA: Diagnosis not present

## 2012-03-18 DIAGNOSIS — I959 Hypotension, unspecified: Secondary | ICD-10-CM

## 2012-03-18 DIAGNOSIS — D3500 Benign neoplasm of unspecified adrenal gland: Secondary | ICD-10-CM | POA: Diagnosis not present

## 2012-03-18 DIAGNOSIS — R1031 Right lower quadrant pain: Secondary | ICD-10-CM | POA: Diagnosis not present

## 2012-03-18 DIAGNOSIS — E876 Hypokalemia: Secondary | ICD-10-CM | POA: Diagnosis not present

## 2012-03-18 DIAGNOSIS — A09 Infectious gastroenteritis and colitis, unspecified: Secondary | ICD-10-CM | POA: Diagnosis present

## 2012-03-18 DIAGNOSIS — E039 Hypothyroidism, unspecified: Secondary | ICD-10-CM

## 2012-03-18 DIAGNOSIS — D696 Thrombocytopenia, unspecified: Secondary | ICD-10-CM

## 2012-03-18 DIAGNOSIS — I862 Pelvic varices: Secondary | ICD-10-CM | POA: Diagnosis not present

## 2012-03-18 DIAGNOSIS — K5289 Other specified noninfective gastroenteritis and colitis: Secondary | ICD-10-CM | POA: Diagnosis not present

## 2012-03-18 DIAGNOSIS — R930 Abnormal findings on diagnostic imaging of skull and head, not elsewhere classified: Secondary | ICD-10-CM

## 2012-03-18 DIAGNOSIS — K529 Noninfective gastroenteritis and colitis, unspecified: Secondary | ICD-10-CM | POA: Diagnosis present

## 2012-03-18 LAB — URINALYSIS, ROUTINE W REFLEX MICROSCOPIC
Bilirubin Urine: NEGATIVE
Glucose, UA: NEGATIVE mg/dL
Hgb urine dipstick: NEGATIVE
Ketones, ur: NEGATIVE mg/dL
Nitrite: NEGATIVE
Protein, ur: NEGATIVE mg/dL
Specific Gravity, Urine: 1.024 (ref 1.005–1.030)
Urobilinogen, UA: 0.2 mg/dL (ref 0.0–1.0)
pH: 5.5 (ref 5.0–8.0)

## 2012-03-18 LAB — CBC WITH DIFFERENTIAL/PLATELET
Basophils Absolute: 0 10*3/uL (ref 0.0–0.1)
Basophils Relative: 0 % (ref 0–1)
Eosinophils Absolute: 0 10*3/uL (ref 0.0–0.7)
Eosinophils Relative: 0 % (ref 0–5)
HCT: 36.4 % (ref 36.0–46.0)
Hemoglobin: 12.2 g/dL (ref 12.0–15.0)
Lymphocytes Relative: 4 % — ABNORMAL LOW (ref 12–46)
Lymphs Abs: 0.4 10*3/uL — ABNORMAL LOW (ref 0.7–4.0)
MCH: 30.8 pg (ref 26.0–34.0)
MCHC: 33.5 g/dL (ref 30.0–36.0)
MCV: 91.9 fL (ref 78.0–100.0)
Monocytes Absolute: 0.8 10*3/uL (ref 0.1–1.0)
Monocytes Relative: 7 % (ref 3–12)
Neutro Abs: 9.5 10*3/uL — ABNORMAL HIGH (ref 1.7–7.7)
Neutrophils Relative %: 89 % — ABNORMAL HIGH (ref 43–77)
Platelets: 107 10*3/uL — ABNORMAL LOW (ref 150–400)
RBC: 3.96 MIL/uL (ref 3.87–5.11)
RDW: 13.1 % (ref 11.5–15.5)
WBC Morphology: INCREASED
WBC: 10.7 10*3/uL — ABNORMAL HIGH (ref 4.0–10.5)

## 2012-03-18 LAB — COMPREHENSIVE METABOLIC PANEL
ALT: 16 U/L (ref 0–35)
AST: 32 U/L (ref 0–37)
Albumin: 3.5 g/dL (ref 3.5–5.2)
Alkaline Phosphatase: 48 U/L (ref 39–117)
BUN: 18 mg/dL (ref 6–23)
CO2: 25 mEq/L (ref 19–32)
Calcium: 8.9 mg/dL (ref 8.4–10.5)
Chloride: 103 mEq/L (ref 96–112)
Creatinine, Ser: 0.9 mg/dL (ref 0.50–1.10)
GFR calc Af Amer: 66 mL/min — ABNORMAL LOW (ref >90)
GFR calc non Af Amer: 57 mL/min — ABNORMAL LOW (ref >90)
Glucose, Bld: 120 mg/dL — ABNORMAL HIGH (ref 70–99)
Potassium: 2.9 mEq/L — ABNORMAL LOW (ref 3.5–5.1)
Sodium: 139 mEq/L (ref 135–145)
Total Bilirubin: 0.7 mg/dL (ref 0.3–1.2)
Total Protein: 6.6 g/dL (ref 6.0–8.3)

## 2012-03-18 LAB — LACTIC ACID, PLASMA: Lactic Acid, Venous: 2.1 mmol/L (ref 0.5–2.2)

## 2012-03-18 LAB — POTASSIUM: Potassium: 3.2 mEq/L — ABNORMAL LOW (ref 3.5–5.1)

## 2012-03-18 LAB — URINE MICROSCOPIC-ADD ON

## 2012-03-18 LAB — LIPASE, BLOOD: Lipase: 20 U/L (ref 11–59)

## 2012-03-18 MED ORDER — ONDANSETRON HCL 4 MG/2ML IJ SOLN
4.0000 mg | Freq: Once | INTRAMUSCULAR | Status: AC
  Administered 2012-03-18: 4 mg via INTRAVENOUS
  Filled 2012-03-18: qty 2

## 2012-03-18 MED ORDER — SODIUM CHLORIDE 0.9 % IV SOLN
INTRAVENOUS | Status: DC
  Administered 2012-03-19 – 2012-03-20 (×2): via INTRAVENOUS

## 2012-03-18 MED ORDER — CIPROFLOXACIN IN D5W 400 MG/200ML IV SOLN
400.0000 mg | Freq: Two times a day (BID) | INTRAVENOUS | Status: DC
  Administered 2012-03-19 – 2012-03-21 (×6): 400 mg via INTRAVENOUS
  Filled 2012-03-18 (×7): qty 200

## 2012-03-18 MED ORDER — POTASSIUM CHLORIDE 10 MEQ/100ML IV SOLN
10.0000 meq | INTRAVENOUS | Status: DC
  Filled 2012-03-18 (×4): qty 100

## 2012-03-18 MED ORDER — ACETAMINOPHEN 650 MG RE SUPP: 650.0000 mg | Freq: Once | RECTAL | Status: DC

## 2012-03-18 MED ORDER — IOHEXOL 300 MG/ML  SOLN
50.0000 mL | Freq: Once | INTRAMUSCULAR | Status: AC | PRN
  Administered 2012-03-18: 50 mL via INTRAVENOUS

## 2012-03-18 MED ORDER — SODIUM CHLORIDE 0.9 % IV BOLUS (SEPSIS)
500.0000 mL | Freq: Once | INTRAVENOUS | Status: AC
  Administered 2012-03-18: 500 mL via INTRAVENOUS

## 2012-03-18 MED ORDER — SODIUM CHLORIDE 0.9 % IV SOLN
Freq: Once | INTRAVENOUS | Status: AC
  Administered 2012-03-18: 16:00:00 via INTRAVENOUS

## 2012-03-18 MED ORDER — POTASSIUM CHLORIDE 10 MEQ/100ML IV SOLN
10.0000 meq | INTRAVENOUS | Status: AC
  Administered 2012-03-19 (×3): 10 meq via INTRAVENOUS
  Filled 2012-03-18 (×3): qty 100

## 2012-03-18 MED ORDER — METRONIDAZOLE IN NACL 5-0.79 MG/ML-% IV SOLN
500.0000 mg | Freq: Once | INTRAVENOUS | Status: AC
  Administered 2012-03-18: 500 mg via INTRAVENOUS
  Filled 2012-03-18: qty 100

## 2012-03-18 MED ORDER — CIPROFLOXACIN IN D5W 400 MG/200ML IV SOLN
400.0000 mg | Freq: Once | INTRAVENOUS | Status: AC
  Administered 2012-03-18: 400 mg via INTRAVENOUS
  Filled 2012-03-18: qty 200

## 2012-03-18 MED ORDER — METRONIDAZOLE IN NACL 5-0.79 MG/ML-% IV SOLN
500.0000 mg | Freq: Three times a day (TID) | INTRAVENOUS | Status: DC
  Administered 2012-03-19 – 2012-03-21 (×6): 500 mg via INTRAVENOUS
  Filled 2012-03-18 (×8): qty 100

## 2012-03-18 MED ORDER — METRONIDAZOLE IN NACL 5-0.79 MG/ML-% IV SOLN
500.0000 mg | Freq: Three times a day (TID) | INTRAVENOUS | Status: DC
  Administered 2012-03-19: 500 mg via INTRAVENOUS
  Filled 2012-03-18 (×2): qty 100

## 2012-03-18 MED ORDER — MORPHINE SULFATE 2 MG/ML IJ SOLN
1.0000 mg | INTRAMUSCULAR | Status: DC | PRN
  Administered 2012-03-18 – 2012-03-19 (×5): 1 mg via INTRAVENOUS
  Filled 2012-03-18 (×5): qty 1

## 2012-03-18 MED ORDER — MORPHINE SULFATE 2 MG/ML IJ SOLN
2.0000 mg | Freq: Once | INTRAMUSCULAR | Status: AC
  Administered 2012-03-18: 2 mg via INTRAVENOUS
  Filled 2012-03-18: qty 1

## 2012-03-18 MED ORDER — IOHEXOL 300 MG/ML  SOLN
100.0000 mL | Freq: Once | INTRAMUSCULAR | Status: AC | PRN
  Administered 2012-03-18: 100 mL via INTRAVENOUS

## 2012-03-18 NOTE — ED Notes (Signed)
Sent over from md office from abd pain and fever not feeling well per family. No emesis pale in color pt alert x4

## 2012-03-18 NOTE — ED Provider Notes (Signed)
History     CSN: [id number]  Arrival date & time 03/18/12  1323   First MD Initiated Contact with Patient 03/18/12 1334      Chief Complaint  Patient presents with  . Abdominal Pain  . Fever    Patient is a 79 y.o. female presenting with abdominal pain. The history is provided by the patient and a relative.  Abdominal Pain Pain location:  RLQ Pain quality: aching   Pain radiates to:  Does not radiate Pain severity:  Moderate Onset quality:  Gradual Duration: today. Timing:  Constant Progression:  Worsening Chronicity:  New Relieved by:  Nothing Worsened by:  Palpation Associated symptoms: diarrhea, fatigue, fever and nausea   Associated symptoms: no chest pain, no cough, no dysuria, no melena, no shortness of breath and no vomiting     Past Medical History  Diagnosis Date  . Hypothyroidism     Surgical history - hernia repair, cholecystectomy  No family history on file.  History  Substance Use Topics  . Smoking status: Never Smoker   . Smokeless tobacco: Not on file  . Alcohol Use: No    OB History   Grav Para Term Preterm Abortions TAB SAB Ect Mult Living                  Review of Systems  Constitutional: Positive for fever and fatigue.  Respiratory: Negative for cough and shortness of breath.   Cardiovascular: Negative for chest pain.  Gastrointestinal: Positive for nausea, abdominal pain and diarrhea. Negative for vomiting, blood in stool and melena.  Genitourinary: Negative for dysuria.  Musculoskeletal: Negative for back pain.  Neurological: Negative for headaches.  Psychiatric/Behavioral: Negative for agitation.  All other systems reviewed and are negative.    Allergies  Sulfonamide derivatives; Codeine; and Latex  Home Medications   Current Outpatient Rx  Name  Route  Sig  Dispense  Refill  . ezetimibe-simvastatin (VYTORIN) 10-40 MG per tablet   Oral   Take 1 tablet by mouth at bedtime.         Marland Kitchen levothyroxine (SYNTHROID,  LEVOTHROID) 88 MCG tablet   Oral   Take 88 mcg by mouth every morning.          . Multiple Vitamin (MULITIVITAMIN WITH MINERALS) TABS   Oral   Take 1 tablet by mouth every morning.            BP 128/74  Pulse 100  Temp(Src) 101.2 F (38.4 C) (Oral)  Resp 16  SpO2 94%  Physical Exam CONSTITUTIONAL: Well developed/well nourished HEAD: Normocephalic/atraumatic EYES: EOMI/PERRL ENMT: Mucous membranes dry NECK: supple no meningeal signs SPINE:entire spine nontender CV: S1/S2 noted, no murmurs/rubs/gallops noted LUNGS: Lungs are clear to auscultation bilaterally, no apparent distress ABDOMEN: soft, moderate RLQ tenderness, no rebound or guarding GU:no cva tenderness NEURO: Pt is awake/alert, moves all extremitiesx4 EXTREMITIES: pulses normal, full ROM SKIN: warm, color normal PSYCH: no abnormalities of mood noted  ED Course  Procedures   Labs Reviewed  URINE CULTURE  URINALYSIS, ROUTINE W REFLEX MICROSCOPIC  COMPREHENSIVE METABOLIC PANEL  CBC WITH DIFFERENTIAL  LIPASE, BLOOD  LACTIC ACID, PLASMA  ]3:24 PM Pt with focal RLQ tenderness Will likely need CT imaging Currently stable 4:01 PM Pt with continued RLQ tenderness Will obtain CT imaging Pt currently stable Signed out to dr ray to f/u on imaging MDM  Nursing notes including past medical history and social history reviewed and considered in documentation Labs/vital reviewed and considered  Joya Gaskins, MD 03/18/12 [id number]

## 2012-03-18 NOTE — H&P (Signed)
PCP:   Adrian Prince MD Chief Complaint:  Abdominal pain and fever  HPI: [name] was sitting up at breakfast when she began to have severe RLQ pain with chills and fever. Some nausea but no vomiting. Two normal BM with no blood or pus. Had been seen in our office 02/18 with suprapubic pain and dysuria and fever. Had Rx Cipro for UTI. Had done well since then until today. No other recent f/c/s. Weight stable. BM's regular. No abdominal pain prior. No cardiac or resp sxs. Slept well last night and felt well upon arising. No prior similar issues. Hx normal colonoscopy in 2003. Prior hernia repair with mesh and cholecystectomy. In ER, CT suggests a diffuse right sided colitis process. Pain at present 5/10. Did better with morphine given a couple of hours ago. Feels weak and washed out. No po intake other than contrast media  Review of Systems:  Review of Systems - Negative except as above Past Medical History: Past Medical History  Diagnosis Date  . HaHypothyroidism Past Medical History (reviewed - no changes required): Osteoporosis MNG/hypothyroid Hyperlipidemia Pneumonia x2   vit D defic 2003: colon:  Healthy-appearing colon up to the cecum except for smallnonbleeding internal and external hemorrhoids.   Surgical History (reviewed - no changes required): GB Hernia repair 2003:  Incisional hernia repair with mesh.      History reviewed. No pertinent past surgical history.  Medications: Prior to Admission medications   Medication Sig Start Date End Date Taking? Authorizing Lauri Purdum  ezetimibe-simvastatin (VYTORIN) 10-40 MG per tablet Take 1 tablet by mouth at bedtime.   Yes Historical Litsy Epting, MD  levothyroxine (SYNTHROID, LEVOTHROID) 88 MCG tablet Take 88 mcg by mouth every morning.    Yes Historical Gabryelle Whitmoyer, MD  Multiple Vitamin (MULITIVITAMIN WITH MINERALS) TABS Take 1 tablet by mouth every morning.    Yes Historical Evalyne Cortopassi, MD    Allergies:   Allergies  Allergen  Reactions  . Sulfonamide Derivatives Hives  . Codeine Other (See Comments)    Passes out  . Latex     Social History:  reports that she has never smoked. She does not have any smokeless tobacco history on file. She reports that she does not drink alcohol or use illicit drugs.Family History (reviewed - no changes required): Father died at 73 from an MI Mother died at 39 from CVA Social History (reviewed - no changes required): Married 1948 (Ed) 2 children  4 GC  F   Physical Exam: Filed Vitals:   03/18/12 1327 03/18/12 1826 03/18/12 1952  BP: 128/74 93/53 109/58  Pulse: 100 84 81  Temp: 101.2 F (38.4 C) 98.9 F (37.2 C)   TempSrc: Oral Oral   Resp: 16 20   SpO2: 94% 96% 95%   General appearance: mild distress lying flat, face symmetric, oral membranes moist. EOMI without nystagmus   Neck:  smalll goiter, neck supple Resp: clear to auscultation bilaterally, no wheezes, rales or rhonchi Cardio: regular rate and rhythm with sys murmur GI: moderately distended ,  diffuse mild tenderness no rebound, no masses or pulsations. Minimal bowel sounds Extremities: extremities normal, atraumatic, no cyanosis or edema Pulses: 2+ and symmetric Lymph nodes:  : no cervical lymphadenopathy Neurologic: Alert and oriented X 3, normal strength and tone. mentating well, speech clear, no tremor Normal symmetric reflexes.     Labs on Admission:   Recent Labs  03/18/12 1435  NA 139  K 2.9*  CL 103  CO2 25  GLUCOSE 120*  BUN 18  CREATININE 0.90  CALCIUM 8.9    Recent Labs  03/18/12 1435  AST 32  ALT 16  ALKPHOS 48  BILITOT 0.7  PROT 6.6  ALBUMIN 3.5    Recent Labs  03/18/12 1435  LIPASE 20    Recent Labs  03/18/12 1435  WBC 10.7*  NEUTROABS 9.5*  HGB 12.2  HCT 36.4  MCV 91.9  PLT 107*       Radiological Exams on Admission: Ct Abdomen Pelvis W Contrast  03/18/2012  *RADIOLOGY REPORT*  Clinical Data: Abdominal pain.  Fever.  CT ABDOMEN AND PELVIS WITH  CONTRAST  Technique:  Multidetector CT imaging of the abdomen and pelvis was performed following the standard protocol during bolus administration of intravenous contrast.  Contrast: 50mL OMNIPAQUE IOHEXOL 300 MG/ML.  Oral contrast was also administered.  Comparison: None.  Findings: Edema involving the wall of the mid and distal ascending colon and proximal transverse colon with mucosal enhancement.  The remainder of the transverse colon, the entire descending and sigmoid colon, and the proximal rectum are decompressed.  There is moderate stool in the rectum and in the cecum.  There is no colonic distention.  The small bowel is normal in appearance.  There is a small hiatal hernia; the stomach is otherwise unremarkable.  No ascites.  Normal-appearing liver and spleen.  Gallbladder surgically absent. No biliary ductal dilation.  Mild diffuse pancreatic atrophy without focal parenchymal abnormality.  Nodules involving both adrenal glands, on the left measuring approximately 1.0 cm in diameter (series 2, image 23), and on the right measuring approximately 1.5 x 1.1 cm (image 17).  Focal scarring involving the mid left kidney, and scattered small cortical cysts involving both kidneys; kidneys otherwise normal in appearance.  Moderate aorto-iliofemoral atherosclerosis without aneurysm.  No significant lymphadenopathy.  Uterus atrophic consistent with age.  Left ovarian varicocele. Numerous pelvic phleboliths.  No free pelvic fluid.  Urinary bladder decompressed and unremarkable.  Bone window images demonstrate generalized osseous demineralization, degenerative changes involving the facet joints of the lower lumbar spine, degenerative disc disease and spondylosis at L4-5 at L5-S1, degenerative changes in the sacroiliac joints and hips, and old healed fracture involving the posterior right ninth rib.  IMPRESSION:  1.  Colitis involving the ascending and transverse colon. 2.  Bilateral adrenal nodules, statistically  consistent with small adenomas. 3.  Mild pancreatic atrophy. 4.  Focal scarring in the mid left kidney. 5.  Left ovarian varicocele.   Original Report Authenticated By: Hulan Saas, M.D.    Orders placed during the hospital encounter of 04/29/11  . EKG 12-LEAD  .    Marland Kitchen   .   .     Assessment/Plan Principal Problem:   Colitis, nonspecific: unusual presentation with acute onset without preceding diarrhea or pain. Most consistent with an infectious cause. Esp in light of recent Abx. Doesn;t seem sick enough for ischemic colitis and she is not acidotic. Distal pulses are strong but some atherosclerosis seen on CT (prob expected for age) Doubt UC or CD given age. Malignancy unlikely. Discussed with GI. They will see. Continue cipro and flagyl and NPO status.  Watch on tele given age and BP.  Active Problems:   Hypotension: suspect volume related. Hydrate. Doubt adrenal issues (small adenomata are incidental)   Hypokalemia: replace IV   Leukocytosis: mild, not as high as I would expect with CDiff   Hypothyroid: hold Rx for tonight   Hyperlipidemia: hold Rx Full CODE   SOUTH,STEPHEN ALAN 03/18/2012, 9:06  PM

## 2012-03-18 NOTE — Progress Notes (Signed)
ANTIBIOTIC CONSULT NOTE - INITIAL  Pharmacy Consult for C.diff Consult Indication: Presumed infectious colitis  Allergies  Allergen Reactions  . Sulfonamide Derivatives Hives  . Codeine Other (See Comments)    Passes out  . Latex     Patient Measurements:   Adjusted Body Weight:   Vital Signs: Temp: 98.9 F (37.2 C) (03/13 1826) Temp src: Oral (03/13 1826) BP: 93/53 mmHg (03/13 1826) Pulse Rate: 84 (03/13 1826) Intake/Output from previous day:   Intake/Output from this shift:    Labs:  Recent Labs  03/18/12 1435  WBC 10.7*  HGB 12.2  PLT 107*  CREATININE 0.90   The CrCl is unknown because both a height and weight (above a minimum accepted value) are required for this calculation. No results found for this basename: VANCOTROUGH, VANCOPEAK, VANCORANDOM, GENTTROUGH, GENTPEAK, GENTRANDOM, TOBRATROUGH, TOBRAPEAK, TOBRARND, AMIKACINPEAK, AMIKACINTROU, AMIKACIN,  in the last 72 hours   Microbiology: No results found for this or any previous visit (from the past 720 hour(s)).  Medical History: Past Medical History  Diagnosis Date  . Hypothyroidism     Assessment: 25 yof presented 3/13 with abdominal pain, fever and diarrhea. Pt reported recently on abx for a UTI. Pt found with leukocytosis, CT abd/pelvis showed colitis involving the ascending and transverse colon. MD ordered Cipro x 1 and Flagyl x 1 for presumed infectious colitis and ordered for pharmacy to dose abx per the C.diff treatment protocol.    Tmax 101.2, WBC 10.7, Scr 0.90.  Given this info, patient would be classified as having mild to moderate C.diff.  Patient would qualify for PO Flagyl per protocol but currently is NPO and with no other PO meds ordered.  Plan:   Stool sample for C.diff PCR   Metronidazole 500 mg IV q8h x 14 days for now.  F/u PO tolerability and convert to PO when able.    Pharmacy will f/u  Geoffry Paradise, PharmD, BCPS Pager: [phone] 7:49 PM Pharmacy #: [id number]

## 2012-03-18 NOTE — ED Provider Notes (Signed)
Patient care accepted from Dr. Bebe Shaggy.  79 y.o. Female with nausea, diarrhea, fever and rlq ttp on exam.  WBC elevated and patient hypokalemic.  CT scan pending.   Ct Abdomen Pelvis W Contrast  03/18/2012  *RADIOLOGY REPORT*  Clinical Data: Abdominal pain.  Fever.  CT ABDOMEN AND PELVIS WITH CONTRAST  Technique:  Multidetector CT imaging of the abdomen and pelvis was performed following the standard protocol during bolus administration of intravenous contrast.  Contrast: 50mL OMNIPAQUE IOHEXOL 300 MG/ML.  Oral contrast was also administered.  Comparison: None.  Findings: Edema involving the wall of the mid and distal ascending colon and proximal transverse colon with mucosal enhancement.  The remainder of the transverse colon, the entire descending and sigmoid colon, and the proximal rectum are decompressed.  There is moderate stool in the rectum and in the cecum.  There is no colonic distention.  The small bowel is normal in appearance.  There is a small hiatal hernia; the stomach is otherwise unremarkable.  No ascites.  Normal-appearing liver and spleen.  Gallbladder surgically absent. No biliary ductal dilation.  Mild diffuse pancreatic atrophy without focal parenchymal abnormality.  Nodules involving both adrenal glands, on the left measuring approximately 1.0 cm in diameter (series 2, image 23), and on the right measuring approximately 1.5 x 1.1 cm (image 17).  Focal scarring involving the mid left kidney, and scattered small cortical cysts involving both kidneys; kidneys otherwise normal in appearance.  Moderate aorto-iliofemoral atherosclerosis without aneurysm.  No significant lymphadenopathy.  Uterus atrophic consistent with age.  Left ovarian varicocele. Numerous pelvic phleboliths.  No free pelvic fluid.  Urinary bladder decompressed and unremarkable.  Bone window images demonstrate generalized osseous demineralization, degenerative changes involving the facet joints of the lower lumbar spine,  degenerative disc disease and spondylosis at L4-5 at L5-S1, degenerative changes in the sacroiliac joints and hips, and old healed fracture involving the posterior right ninth rib.  IMPRESSION:  1.  Colitis involving the ascending and transverse colon. 2.  Bilateral adrenal nodules, statistically consistent with small adenomas. 3.  Mild pancreatic atrophy. 4.  Focal scarring in the mid left kidney. 5.  Left ovarian varicocele.   Original Report Authenticated By: Hulan Saas, M.D.    CT report reviewed and labs reviewed. Patient will have potassium replenishment started. She will receive Cipro and Flagyl were presumed infectious colitis. I discussed results with patient her family. Her systolic blood pressure is 95. She is going to have a 500 cc bolus. She is awake and alert and feels a little discomfort but otherwise appears well. He does state that she was recently on some antibiotics for urinary tract infection. She'll have a C. difficile sent.  Patient's care discussed with Dr. Adria Devon he'll be in to see and admit.  Hilario Quarry, MD 03/18/12 [phone]

## 2012-03-19 ENCOUNTER — Encounter (HOSPITAL_COMMUNITY): Payer: Self-pay | Admitting: Cardiology

## 2012-03-19 DIAGNOSIS — K529 Noninfective gastroenteritis and colitis, unspecified: Secondary | ICD-10-CM

## 2012-03-19 LAB — CBC
HCT: 36.2 % (ref 36.0–46.0)
Hemoglobin: 12.1 g/dL (ref 12.0–15.0)
MCH: 31.3 pg (ref 26.0–34.0)
MCHC: 33.4 g/dL (ref 30.0–36.0)
MCV: 93.8 fL (ref 78.0–100.0)
Platelets: 120 10*3/uL — ABNORMAL LOW (ref 150–400)
RBC: 3.86 MIL/uL — ABNORMAL LOW (ref 3.87–5.11)
RDW: 13.6 % (ref 11.5–15.5)
WBC: 12.7 10*3/uL — ABNORMAL HIGH (ref 4.0–10.5)

## 2012-03-19 LAB — BASIC METABOLIC PANEL
BUN: 22 mg/dL (ref 6–23)
CO2: 20 mEq/L (ref 19–32)
Calcium: 7.8 mg/dL — ABNORMAL LOW (ref 8.4–10.5)
Chloride: 102 mEq/L (ref 96–112)
Creatinine, Ser: 1.17 mg/dL — ABNORMAL HIGH (ref 0.50–1.10)
GFR calc Af Amer: 48 mL/min — ABNORMAL LOW (ref >90)
GFR calc non Af Amer: 42 mL/min — ABNORMAL LOW (ref >90)
Glucose, Bld: 118 mg/dL — ABNORMAL HIGH (ref 70–99)
Potassium: 4.1 mEq/L (ref 3.5–5.1)
Sodium: 135 mEq/L (ref 135–145)

## 2012-03-19 LAB — CLOSTRIDIUM DIFFICILE BY PCR: Toxigenic C. Difficile by PCR: NEGATIVE

## 2012-03-19 MED ORDER — ACETAMINOPHEN 325 MG PO TABS
650.0000 mg | ORAL_TABLET | Freq: Three times a day (TID) | ORAL | Status: DC | PRN
  Administered 2012-03-19 – 2012-03-20 (×2): 650 mg via ORAL
  Filled 2012-03-19 (×2): qty 2

## 2012-03-19 NOTE — Progress Notes (Signed)
I called infection control,talked to Seabron Spates, she said it is okay to d/c contact precaution.- Hulda Marin RN

## 2012-03-19 NOTE — Progress Notes (Signed)
Subjective: Diarrhea started up overnight. Has had several "urgent" BM's. Abd pain is quite a bit better. No nausea. Got some rest   Objective: Vital signs in last 24 hours: Temp:  [98 F (36.7 C)-101.2 F (38.4 C)] 98 F (36.7 C) (03/14 0600) Pulse Rate:  [78-100] 78 (03/14 0849) Resp:  [16-20] 20 (03/14 0600) BP: (93-128)/(53-74) 108/63 mmHg (03/14 0849) SpO2:  [94 %-96 %] 96 % (03/14 0600) Weight:  [83.915 kg (185 lb)-84.369 kg (186 lb)] 84.369 kg (186 lb) (03/14 0143)  Intake/Output from previous day: 03/13 0701 - 03/14 0700 In: 1000 [I.V.:1000] Out: -  Intake/Output this shift:    General: alert lying flat without dyspnea. Face symmetric. Neck supple. Lungs clear. Ht regular, abd less distended. Hyperactive BS's. Less tender. Alert, awake, mentating well  Lab Results   Recent Labs  03/18/12 1435 03/19/12 0500  WBC 10.7* 12.7*  RBC 3.96 3.86*  HGB 12.2 12.1  HCT 36.4 36.2  MCV 91.9 93.8  MCH 30.8 31.3  RDW 13.1 13.6  PLT 107* 120*    Recent Labs  03/18/12 1435 03/18/12 2215 03/19/12 0500  NA 139  --  135  K 2.9* 3.2* 4.1  CL 103  --  102  CO2 25  --  20  GLUCOSE 120*  --  118*  BUN 18  --  22  CREATININE 0.90  --  1.17*  CALCIUM 8.9  --  7.8*    Studies/Results: Ct Abdomen Pelvis W Contrast  03/18/2012  *RADIOLOGY REPORT*  Clinical Data: Abdominal pain.  Fever.  CT ABDOMEN AND PELVIS WITH CONTRAST  Technique:  Multidetector CT imaging of the abdomen and pelvis was performed following the standard protocol during bolus administration of intravenous contrast.  Contrast: 50mL OMNIPAQUE IOHEXOL 300 MG/ML.  Oral contrast was also administered.  Comparison: None.  Findings: Edema involving the wall of the mid and distal ascending colon and proximal transverse colon with mucosal enhancement.  The remainder of the transverse colon, the entire descending and sigmoid colon, and the proximal rectum are decompressed.  There is moderate stool in the rectum and in the  cecum.  There is no colonic distention.  The small bowel is normal in appearance.  There is a small hiatal hernia; the stomach is otherwise unremarkable.  No ascites.  Normal-appearing liver and spleen.  Gallbladder surgically absent. No biliary ductal dilation.  Mild diffuse pancreatic atrophy without focal parenchymal abnormality.  Nodules involving both adrenal glands, on the left measuring approximately 1.0 cm in diameter (series 2, image 23), and on the right measuring approximately 1.5 x 1.1 cm (image 17).  Focal scarring involving the mid left kidney, and scattered small cortical cysts involving both kidneys; kidneys otherwise normal in appearance.  Moderate aorto-iliofemoral atherosclerosis without aneurysm.  No significant lymphadenopathy.  Uterus atrophic consistent with age.  Left ovarian varicocele. Numerous pelvic phleboliths.  No free pelvic fluid.  Urinary bladder decompressed and unremarkable.  Bone window images demonstrate generalized osseous demineralization, degenerative changes involving the facet joints of the lower lumbar spine, degenerative disc disease and spondylosis at L4-5 at L5-S1, degenerative changes in the sacroiliac joints and hips, and old healed fracture involving the posterior right ninth rib.  IMPRESSION:  1.  Colitis involving the ascending and transverse colon. 2.  Bilateral adrenal nodules, statistically consistent with small adenomas. 3.  Mild pancreatic atrophy. 4.  Focal scarring in the mid left kidney. 5.  Left ovarian varicocele.   Original Report Authenticated By: Hulan Saas, M.D.  Scheduled Meds: . ciprofloxacin  400 mg Intravenous Q12H  . metronidazole  500 mg Intravenous Q8H   Continuous Infusions: . sodium chloride 125 mL/hr at 03/19/12 0005   PRN Meds:morphine injection  Assessment/Plan: Colitis, nonspecific: Picture now seems more typical for C diff. Abd exam is improved. WBC up a little. Await GI inpu Active Problems:  Hypotension: doing  better  Hypokalemia: better Leukocytosis: sl worse Hypothyroid: hold Rx   Hyperlipidemia: hold Rx  Full CODE     LOS: 1 day   Traci Richardson ALAN 03/19/2012, 9:00 AM

## 2012-03-19 NOTE — Consult Note (Signed)
[facility] Consultation  Referring Provider: [facility]   Primary Care Physician:  Adrian Prince, MD Primary Gastroenterologist:         Reason for Consultation:  colitis      HPI: Traci DEDEAUX is a 79 y.o. female, relatively healthy, admitted yesterday by PCP. Patient lives at home with her husband. Yesterday husband called patient's daughter to have her come check on mother. Patient had temp of 101. She was nauseated and complaining of RLQ pain. In ED WBC was 10.7. CTscan revealed right sided colitis. Diarrhea really didn't start until after arrival to hospital. Stools nonbloody. Today her abdominal pain is better. Patient had antibiotics a few weeks ago for UTI. No sick contacts or out of country travel. No chronic GI problems.   Past Medical History  Diagnosis Date  . Hypothyroidism    FMH:  No colon cancer in family. Positive for uterine cancer.  History  Substance Use Topics  . Smoking status: Never Smoker   . Smokeless tobacco: Not on file  . Alcohol Use: No    Prior to Admission medications   Medication Sig Start Date End Date Taking? Authorizing Provider  ezetimibe-simvastatin (VYTORIN) 10-40 MG per tablet Take 1 tablet by mouth at bedtime.   Yes Historical Provider, MD  levothyroxine (SYNTHROID, LEVOTHROID) 88 MCG tablet Take 88 mcg by mouth every morning.    Yes Historical Provider, MD  Multiple Vitamin (MULITIVITAMIN WITH MINERALS) TABS Take 1 tablet by mouth every morning.    Yes Historical Provider, MD    Current Facility-Administered Medications  Medication Dose Route Frequency Provider Last Rate Last Dose  . 0.9 %  sodium chloride infusion   Intravenous Continuous Julian Hy, MD 125 mL/hr at 03/19/12 0005    . ciprofloxacin (CIPRO) IVPB 400 mg  400 mg Intravenous Q12H Julian Hy, MD   400 mg at 03/19/12 0846  . metroNIDAZOLE (FLAGYL) IVPB 500 mg  500 mg Intravenous Q8H Thuyvan Thi Marshall Cork, RPH      . morphine 2 MG/ML injection 1  mg  1 mg Intravenous Q3H PRN Julian Hy, MD   1 mg at 03/19/12 0855    Allergies as of 03/18/2012 - Review Complete 03/18/2012  Allergen Reaction Noted  . Sulfonamide derivatives Hives   . Codeine Other (See Comments)   . Latex      Review of Systems:    All systems reviewed and negative except where noted in HPI.   PHYSICAL EXAM: Vital signs in last 24 hours: Temp:  [98 F (36.7 C)-101.2 F (38.4 C)] 98 F (36.7 C) (03/14 0600) Pulse Rate:  [78-100] 78 (03/14 0849) Resp:  [16-20] 20 (03/14 0600) BP: (93-128)/(53-74) 108/63 mmHg (03/14 0849) SpO2:  [94 %-96 %] 96 % (03/14 0600) Weight:  [185 lb (83.915 kg)-186 lb (84.369 kg)] 186 lb (84.369 kg) (03/14 0143) Last BM Date: 03/19/12 General:   Pleasant , healthy appearing white female in NAD Head:  Normocephalic and atraumatic. Eyes:   No icterus.   Conjunctiva pink. Ears:  Normal auditory acuity. Neck:  Supple; no masses felt Lungs:  Respirations even and unlabored. Lungs clear to auscultation bilaterally.   No wheezes, crackles, or rhonchi.  Heart:  Regular rate and rhythm Abdomen:  Soft, mildly distended, mild RLQ tenderness. Hypoactive bowel sounds. No appreciable masses or hepatomegaly.  Rectal:  Not performed.  Msk:  Symmetrical without gross deformities.  Extremities:  Without edema. Neurologic:  Alert and  oriented x4;  grossly normal neurologically. Skin:  Intact without significant lesions or rashes. Cervical Nodes:  No significant cervical adenopathy. Psych:  Alert and cooperative. Normal affect.  LAB RESULTS:  Recent Labs  03/18/12 1435 03/19/12 0500  WBC 10.7* 12.7*  HGB 12.2 12.1  HCT 36.4 36.2  PLT 107* 120*   BMET  Recent Labs  03/18/12 1435 03/18/12 2215 03/19/12 0500  NA 139  --  135  K 2.9* 3.2* 4.1  CL 103  --  102  CO2 25  --  20  GLUCOSE 120*  --  118*  BUN 18  --  22  CREATININE 0.90  --  1.17*  CALCIUM 8.9  --  7.8*   LFT  Recent Labs  03/18/12 1435  PROT 6.6   ALBUMIN 3.5  AST 32  ALT 16  ALKPHOS 48  BILITOT 0.7    STUDIES: Ct Abdomen Pelvis W Contrast  03/18/2012  *RADIOLOGY REPORT*  Clinical Data: Abdominal pain.  Fever.  CT ABDOMEN AND PELVIS WITH CONTRAST  Technique:  Multidetector CT imaging of the abdomen and pelvis was performed following the standard protocol during bolus administration of intravenous contrast.  Contrast: 50mL OMNIPAQUE IOHEXOL 300 MG/ML.  Oral contrast was also administered.  Comparison: None.  Findings: Edema involving the wall of the mid and distal ascending colon and proximal transverse colon with mucosal enhancement.  The remainder of the transverse colon, the entire descending and sigmoid colon, and the proximal rectum are decompressed.  There is moderate stool in the rectum and in the cecum.  There is no colonic distention.  The small bowel is normal in appearance.  There is a small hiatal hernia; the stomach is otherwise unremarkable.  No ascites.  Normal-appearing liver and spleen.  Gallbladder surgically absent. No biliary ductal dilation.  Mild diffuse pancreatic atrophy without focal parenchymal abnormality.  Nodules involving both adrenal glands, on the left measuring approximately 1.0 cm in diameter (series 2, image 23), and on the right measuring approximately 1.5 x 1.1 cm (image 17).  Focal scarring involving the mid left kidney, and scattered small cortical cysts involving both kidneys; kidneys otherwise normal in appearance.  Moderate aorto-iliofemoral atherosclerosis without aneurysm.  No significant lymphadenopathy.  Uterus atrophic consistent with age.  Left ovarian varicocele. Numerous pelvic phleboliths.  No free pelvic fluid.  Urinary bladder decompressed and unremarkable.  Bone window images demonstrate generalized osseous demineralization, degenerative changes involving the facet joints of the lower lumbar spine, degenerative disc disease and spondylosis at L4-5 at L5-S1, degenerative changes in the sacroiliac  joints and hips, and old healed fracture involving the posterior right ninth rib.  IMPRESSION:  1.  Colitis involving the ascending and transverse colon. 2.  Bilateral adrenal nodules, statistically consistent with small adenomas. 3.  Mild pancreatic atrophy. 4.  Focal scarring in the mid left kidney. 5.  Left ovarian varicocele.   Original Report Authenticated By: Hulan Saas, M.D.    Images reviewed Iva Boop, MD, [facility]    PREVIOUS ENDOSCOPIES:            Per patient she had a colonoscopy screening colonoscopy in 2003 by Dr Loreta Ave. Told she didn't ever need another due to age.   IMPRESSION / PLAN:     Acute colitis, non-c.diff related suspect infectious Pleasant 79 year old white female admitted with lower abdominal pain and fever in setting of CTscan with contrast revealing ascending colon and transverse colon colitis. Still suspect infectious etiology. WBC 12.7 but her abdominal pain is better, abdominal exam not overly concerning  and she has been afebrile since 10pm last night. Continue IV antibiotics. Trial of clears. Will follow.  Thanks   LOS: 1 day   Willette Cluster  03/19/2012, 10:00 AM  [facility] Attending  I have also seen and assessed the patient and agree with the above note. Seems like infectious colitis but will double check w/ radiology re: patency of SMA as ischemia still possible.   Dr. Loreta Ave will follow-up for me tomorrow.  Iva Boop, MD, [facility] [phone] (pager) 03/19/2012 2:40 PM

## 2012-03-19 NOTE — Progress Notes (Signed)
Pt admitted from ED.  Family at bedside. Pt vital signs stable. Pt and family orientated to the unit. Pt given pain medication per MD Orders. New orders initiated. All questions and concerns addressed.

## 2012-03-20 LAB — COMPREHENSIVE METABOLIC PANEL
ALT: 18 U/L (ref 0–35)
AST: 27 U/L (ref 0–37)
Albumin: 2.7 g/dL — ABNORMAL LOW (ref 3.5–5.2)
Alkaline Phosphatase: 44 U/L (ref 39–117)
BUN: 15 mg/dL (ref 6–23)
CO2: 23 mEq/L (ref 19–32)
Calcium: 8.2 mg/dL — ABNORMAL LOW (ref 8.4–10.5)
Chloride: 109 mEq/L (ref 96–112)
Creatinine, Ser: 1.01 mg/dL (ref 0.50–1.10)
GFR calc Af Amer: 58 mL/min — ABNORMAL LOW (ref >90)
GFR calc non Af Amer: 50 mL/min — ABNORMAL LOW (ref >90)
Glucose, Bld: 88 mg/dL (ref 70–99)
Potassium: 3.4 mEq/L — ABNORMAL LOW (ref 3.5–5.1)
Sodium: 140 mEq/L (ref 135–145)
Total Bilirubin: 0.3 mg/dL (ref 0.3–1.2)
Total Protein: 5.6 g/dL — ABNORMAL LOW (ref 6.0–8.3)

## 2012-03-20 LAB — URINE CULTURE
Colony Count: NO GROWTH
Culture: NO GROWTH

## 2012-03-20 LAB — CBC
HCT: 36 % (ref 36.0–46.0)
Hemoglobin: 12 g/dL (ref 12.0–15.0)
MCH: 31.5 pg (ref 26.0–34.0)
MCHC: 33.3 g/dL (ref 30.0–36.0)
MCV: 94.5 fL (ref 78.0–100.0)
Platelets: 100 10*3/uL — ABNORMAL LOW (ref 150–400)
RBC: 3.81 MIL/uL — ABNORMAL LOW (ref 3.87–5.11)
RDW: 14 % (ref 11.5–15.5)
WBC: 9.4 10*3/uL (ref 4.0–10.5)

## 2012-03-20 MED ORDER — POTASSIUM CHLORIDE IN NACL 20-0.9 MEQ/L-% IV SOLN
INTRAVENOUS | Status: DC
  Administered 2012-03-20 – 2012-03-21 (×2): via INTRAVENOUS
  Filled 2012-03-20 (×5): qty 1000

## 2012-03-20 NOTE — Progress Notes (Signed)
Subjective: Cross cover for [facility] Patient seems to be doing better today. Stools are more formed and she has not had any fevers. Her appetite seems to be slowly improving. Denies having any abdominal pan, nausea or vomiting.  Objective: Vital signs in last 24 hours: Temp:  [98 F (36.7 C)-98.9 F (37.2 C)] 98.5 F (36.9 C) (03/15 1347) Pulse Rate:  [64-71] 64 (03/15 1347) Resp:  [18] 18 (03/15 1347) BP: (113-147)/(72-87) 138/87 mmHg (03/15 1347) SpO2:  [96 %-98 %] 98 % (03/15 1347) Last BM Date: 03/20/12  Intake/Output from previous day: 03/14 0701 - 03/15 0700 In: 3700 [I.V.:3000; IV Piggyback:700] Out: -  Intake/Output this shift: Total I/O In: 1091.7 [I.V.:1091.7] Out: -   General appearance: alert, cooperative, appears stated age and no distress Resp: clear to auscultation bilaterally Cardio: regular rate and rhythm, S1, S2 normal, no murmur, click, rub or gallop GI: soft, non-tender; bowel sounds normal; no masses,  no organomegaly  Lab Results:  Recent Labs  03/18/12 1435 03/19/12 0500 03/20/12 0532  WBC 10.7* 12.7* 9.4  HGB 12.2 12.1 12.0  HCT 36.4 36.2 36.0  PLT 107* 120* 100*   BMET  Recent Labs  03/18/12 1435 03/18/12 2215 03/19/12 0500 03/20/12 0532  NA 139  --  135 140  K 2.9* 3.2* 4.1 3.4*  CL 103  --  102 109  CO2 25  --  20 23  GLUCOSE 120*  --  118* 88  BUN 18  --  22 15  CREATININE 0.90  --  1.17* 1.01  CALCIUM 8.9  --  7.8* 8.2*   LFT  Recent Labs  03/20/12 0532  PROT 5.6*  ALBUMIN 2.7*  AST 27  ALT 18  ALKPHOS 44  BILITOT 0.3   Studies/Results: Ct Abdomen Pelvis W Contrast  03/18/2012  *RADIOLOGY REPORT*  Clinical Data: Abdominal pain.  Fever.  CT ABDOMEN AND PELVIS WITH CONTRAST  Technique:  Multidetector CT imaging of the abdomen and pelvis was performed following the standard protocol during bolus administration of intravenous contrast.  Contrast: 50mL OMNIPAQUE IOHEXOL 300 MG/ML.  Oral contrast was also administered.   Comparison: None.  Findings: Edema involving the wall of the mid and distal ascending colon and proximal transverse colon with mucosal enhancement.  The remainder of the transverse colon, the entire descending and sigmoid colon, and the proximal rectum are decompressed.  There is moderate stool in the rectum and in the cecum.  There is no colonic distention.  The small bowel is normal in appearance.  There is a small hiatal hernia; the stomach is otherwise unremarkable.  No ascites.  Normal-appearing liver and spleen.  Gallbladder surgically absent. No biliary ductal dilation.  Mild diffuse pancreatic atrophy without focal parenchymal abnormality.  Nodules involving both adrenal glands, on the left measuring approximately 1.0 cm in diameter (series 2, image 23), and on the right measuring approximately 1.5 x 1.1 cm (image 17).  Focal scarring involving the mid left kidney, and scattered small cortical cysts involving both kidneys; kidneys otherwise normal in appearance.  Moderate aorto-iliofemoral atherosclerosis without aneurysm.  No significant lymphadenopathy.  Uterus atrophic consistent with age.  Left ovarian varicocele. Numerous pelvic phleboliths.  No free pelvic fluid.  Urinary bladder decompressed and unremarkable.  Bone window images demonstrate generalized osseous demineralization, degenerative changes involving the facet joints of the lower lumbar spine, degenerative disc disease and spondylosis at L4-5 at L5-S1, degenerative changes in the sacroiliac joints and hips, and old healed fracture involving the posterior right ninth  rib.  IMPRESSION:  1.  Colitis involving the ascending and transverse colon. 2.  Bilateral adrenal nodules, statistically consistent with small adenomas. 3.  Mild pancreatic atrophy. 4.  Focal scarring in the mid left kidney. 5.  Left ovarian varicocele.   Original Report Authenticated By: Hulan Saas, M.D.    Medications: I have reviewed the patient's current  medications.  Assessment/Plan: 1) ?Acute colitis; improving on Ciprofloxacin and Flagyl. Continue present care. 2) Pancreatic atrophy noted on CT: may need to rule out PI. 3) Thrombocytopenia. 4) Hypoalbuminemia.   LOS: 2 days   Traci Richardson 03/20/2012, 4:04 PM

## 2012-03-20 NOTE — Progress Notes (Signed)
Subjective: Left sided abdominal pain is improved, however she is eating very little food. Diarrhea has improved.  Objective: Vital signs in last 24 hours: Temp:  [98 F (36.7 C)-98.9 F (37.2 C)] 98 F (36.7 C) (03/15 0525) Pulse Rate:  [61-71] 67 (03/15 0525) Resp:  [18] 18 (03/15 0525) BP: (100-147)/(57-81) 147/81 mmHg (03/15 0525) SpO2:  [92 %-97 %] 96 % (03/15 0525) Weight change:    Intake/Output from previous day: 03/14 0701 - 03/15 0700 In: 3700 [I.V.:3000; IV Piggyback:700] Out: -    General appearance: alert, cooperative and no distress Resp: clear to auscultation bilaterally Cardio: regular rate and rhythm, S1, S2 normal, no murmur, click, rub or gallop GI: bowel sounds decreased but present, mild LUQ tenderness without rebound Extremities: extremities normal, atraumatic, no cyanosis or edema  Lab Results:  Recent Labs  03/19/12 0500 03/20/12 0532  WBC 12.7* 9.4  HGB 12.1 12.0  HCT 36.2 36.0  PLT 120* 100*   BMET  Recent Labs  03/19/12 0500 03/20/12 0532  NA 135 140  K 4.1 3.4*  CL 102 109  CO2 20 23  GLUCOSE 118* 88  BUN 22 15  CREATININE 1.17* 1.01  CALCIUM 7.8* 8.2*   CMET CMP     Component Value Date/Time   NA 140 03/20/2012 0532   K 3.4* 03/20/2012 0532   CL 109 03/20/2012 0532   CO2 23 03/20/2012 0532   GLUCOSE 88 03/20/2012 0532   BUN 15 03/20/2012 0532   CREATININE 1.01 03/20/2012 0532   CALCIUM 8.2* 03/20/2012 0532   PROT 5.6* 03/20/2012 0532   ALBUMIN 2.7* 03/20/2012 0532   AST 27 03/20/2012 0532   ALT 18 03/20/2012 0532   ALKPHOS 44 03/20/2012 0532   BILITOT 0.3 03/20/2012 0532   GFRNONAA 50* 03/20/2012 0532   GFRAA 58* 03/20/2012 0532    CBG (last 3)  No results found for this basename: GLUCAP,  in the last 72 hours  INR RESULTS:   Lab Results  Component Value Date   INR 1.00 01/31/2011     Studies/Results: Ct Abdomen Pelvis W Contrast  03/18/2012  *RADIOLOGY REPORT*  Clinical Data: Abdominal pain.  Fever.  CT ABDOMEN AND  PELVIS WITH CONTRAST  Technique:  Multidetector CT imaging of the abdomen and pelvis was performed following the standard protocol during bolus administration of intravenous contrast.  Contrast: 50mL OMNIPAQUE IOHEXOL 300 MG/ML.  Oral contrast was also administered.  Comparison: None.  Findings: Edema involving the wall of the mid and distal ascending colon and proximal transverse colon with mucosal enhancement.  The remainder of the transverse colon, the entire descending and sigmoid colon, and the proximal rectum are decompressed.  There is moderate stool in the rectum and in the cecum.  There is no colonic distention.  The small bowel is normal in appearance.  There is a small hiatal hernia; the stomach is otherwise unremarkable.  No ascites.  Normal-appearing liver and spleen.  Gallbladder surgically absent. No biliary ductal dilation.  Mild diffuse pancreatic atrophy without focal parenchymal abnormality.  Nodules involving both adrenal glands, on the left measuring approximately 1.0 cm in diameter (series 2, image 23), and on the right measuring approximately 1.5 x 1.1 cm (image 17).  Focal scarring involving the mid left kidney, and scattered small cortical cysts involving both kidneys; kidneys otherwise normal in appearance.  Moderate aorto-iliofemoral atherosclerosis without aneurysm.  No significant lymphadenopathy.  Uterus atrophic consistent with age.  Left ovarian varicocele. Numerous pelvic phleboliths.  No free pelvic  fluid.  Urinary bladder decompressed and unremarkable.  Bone window images demonstrate generalized osseous demineralization, degenerative changes involving the facet joints of the lower lumbar spine, degenerative disc disease and spondylosis at L4-5 at L5-S1, degenerative changes in the sacroiliac joints and hips, and old healed fracture involving the posterior right ninth rib.  IMPRESSION:  1.  Colitis involving the ascending and transverse colon. 2.  Bilateral adrenal nodules,  statistically consistent with small adenomas. 3.  Mild pancreatic atrophy. 4.  Focal scarring in the mid left kidney. 5.  Left ovarian varicocele.   Original Report Authenticated By: Hulan Saas, M.D.     Medications: I have reviewed the patient's current medications.  Assessment/Plan: #1 Colitis: improved with IV antibiotics and IVF, will see how food intake goes today and if there is significant improvement then she may be discharged tomorrow.  #2 Hypokalemia: mild and we will change IVF to contain KCL. Will recheck CMET in the morning.   LOS: 2 days   Devario Bucklew G 03/20/2012, 10:29 AM

## 2012-03-20 NOTE — Progress Notes (Signed)
Per patient and daughter patient had 5-6 loose stools during the night.  Patient denies any cramping or pain.  States she just has to "jump up and go".

## 2012-03-21 DIAGNOSIS — D696 Thrombocytopenia, unspecified: Secondary | ICD-10-CM | POA: Diagnosis not present

## 2012-03-21 MED ORDER — SACCHAROMYCES BOULARDII 250 MG PO CAPS: 250.0000 mg | ORAL_CAPSULE | Freq: Two times a day (BID) | ORAL | Status: AC

## 2012-03-21 MED ORDER — METRONIDAZOLE 500 MG PO TABS: 500.0000 mg | ORAL_TABLET | Freq: Three times a day (TID) | ORAL | Status: AC

## 2012-03-21 MED ORDER — VITAMINS A & D EX OINT
TOPICAL_OINTMENT | CUTANEOUS | Status: AC
  Filled 2012-03-21: qty 10

## 2012-03-21 MED ORDER — CIPROFLOXACIN HCL 500 MG PO TABS: 500.0000 mg | ORAL_TABLET | Freq: Two times a day (BID) | ORAL | Status: AC

## 2012-03-21 NOTE — Progress Notes (Signed)
Patient's IV infiltrated during administration of Cipro; Rate reduced from 211ml/hr to 182ml/hr, then to 56ml/hr as it was irritating her veins running it in higher than 75. IV site had to be changed, and both prior sites were removed. Nurse was able to administer remainder of antibx. Close attention paid to whether irritation would begin again.

## 2012-03-21 NOTE — Discharge Summary (Signed)
Physician Discharge Summary  Patient ID: Traci Richardson MRN: [id number] DOB/AGE: 08/10/1925 79 y.o.  Admit date: 03/18/2012 Discharge date: 03/21/2012   Discharge Diagnoses:  Principal Problem:   Colitis presumed infectious Active Problems:   Thrombocytopenia, unspecified   Hypotension   Hypokalemia   Leukocytosis   Hypothyroid   Hyperlipidemia   Discharged Condition: good  Hospital Course:   [name] was sitting up at breakfast when she began to have severe RLQ pain with chills and fever. Some nausea but no vomiting. Two normal BM with no blood or pus. Had been seen in our office 02/18 with suprapubic pain and dysuria and fever. Had Rx Cipro for UTI. Had done well since then until today. No other recent f/c/s. Weight stable. BM's regular. No abdominal pain prior. No cardiac or resp sxs. Slept well last night and felt well upon arising. No prior similar issues. Hx normal colonoscopy in 2003. Prior hernia repair with mesh and cholecystectomy. In ER, CT suggests a diffuse right sided colitis process. Pain at present 5/10. Did better with morphine given a couple of hours ago. Feels weak and washed out. No po intake other than contrast media. She had a workup in the ER that showed colitis and she was admitted for further evaluation.   She was treated with IV fluids and IV Flagyl and ciprofloxacin. She had a stool specimen sent for clostridium difficile toxin which was negative. A GI consultant agreed with the antibiotic choices. Her condition improved relatively rapidly and by the day of discharge she was able to eat and drink well without any difficulty. Her diarrhea had resolved and her abdominal pain had improved a great deal. She had minimal bloating and flatus on the day of discharge. She had no fever or chills and ate much of her breakfast. Procedures during her hospitalization included a CT scan of the abdomen and pelvis. There were no complications during  hospitalization.  Consults: GI  Significant Diagnostic Studies:  No results found.  Labs: Lab Results  Component Value Date   WBC 9.4 03/20/2012   HGB 12.0 03/20/2012   HCT 36.0 03/20/2012   MCV 94.5 03/20/2012   PLT 100* 03/20/2012     Recent Labs Lab 03/20/12 0532  NA 140  K 3.4*  CL 109  CO2 23  BUN 15  CREATININE 1.01  CALCIUM 8.2*  PROT 5.6*  BILITOT 0.3  ALKPHOS 44  ALT 18  AST 27  GLUCOSE 88       Lab Results  Component Value Date   INR 1.00 01/31/2011     Recent Results (from the past 240 hour(s))  URINE CULTURE     Status: None   Collection Time    03/18/12  2:57 PM      Result Value Range Status   Specimen Description URINE, CLEAN CATCH   Final   Special Requests NONE   Final   Culture  Setup Time 03/19/2012 01:41   Final   Colony Count NO GROWTH   Final   Culture NO GROWTH   Final   Report Status 03/20/2012 FINAL   Final  CLOSTRIDIUM DIFFICILE BY PCR     Status: None   Collection Time    03/19/12 12:38 AM      Result Value Range Status   C difficile by pcr NEGATIVE  NEGATIVE Final      Discharge Exam: Blood pressure 139/81, pulse 86, temperature 98 F (36.7 C), temperature source Oral, resp. rate 18, height  5\' 3"  (1.6 m), weight 84.369 kg (186 lb), SpO2 96.00%.  Physical Exam: In general, the patient is a mildly overweight white woman who was in no apparent distress walking in the room. HEENT exam was within normal limits, neck was supple without jugular venous distention, chest was clear to auscultation, heart had a regular rate and rhythm, abdomen had normal bowel sounds no tenderness, extremities were without cyanosis, clubbing, or edema. Neurologic exam was nonfocal.  Disposition: She'll be discharged from the hospital to home in the company of family members. She was advised that she could resume her regular diet. We discussed that she should call us if she develops severe diarrhea with fever or a yeast infection which is common after  treatment with antibiotics. She will be treated with a probiotic to try to prevent antibiotic associated diarrhea.   Discharge Orders   Future Orders Complete By Expires     Call MD for:  As directed     Comments:      Call physician for fever, chills, severe diarrhea, lightheadedness, or other concerning symptoms.    Diet - low sodium heart healthy  As directed     Discharge instructions  As directed     Comments:      You'll be discharged to home today. Complete one more week of antibiotics. In addition take the probiotic, florastor, twice daily for the next 4 weeks to prevent diarrhea after antibiotics. Call our office if you have diarrhea with fever or yeast infection, or other concerning symptoms.    Increase activity slowly  As directed         Medication List    TAKE these medications       ciprofloxacin 500 MG tablet  Commonly known as:  CIPRO  Take 1 tablet (500 mg total) by mouth 2 (two) times daily.     ezetimibe-simvastatin 10-40 MG per tablet  Commonly known as:  VYTORIN  Take 1 tablet by mouth at bedtime.     levothyroxine 88 MCG tablet  Commonly known as:  SYNTHROID, LEVOTHROID  Take 88 mcg by mouth every morning.     metroNIDAZOLE 500 MG tablet  Commonly known as:  FLAGYL  Take 1 tablet (500 mg total) by mouth 3 (three) times daily.     multivitamin with minerals Tabs  Take 1 tablet by mouth every morning.     saccharomyces boulardii 250 MG capsule  Commonly known as:  FLORASTOR  Take 1 capsule (250 mg total) by mouth 2 (two) times daily.           Follow-up Information   Follow up with Julian Hy, MD. Schedule an appointment as soon as possible for a visit in 1 week.   Contact information:   [address] [place] [address] [phone]       Signed: Garlan Fillers 03/21/2012, 11:11 AM

## 2012-03-23 LAB — STOOL CULTURE: Special Requests: NORMAL

## 2012-04-14 DIAGNOSIS — Z961 Presence of intraocular lens: Secondary | ICD-10-CM | POA: Diagnosis not present

## 2012-04-14 DIAGNOSIS — H04129 Dry eye syndrome of unspecified lacrimal gland: Secondary | ICD-10-CM | POA: Diagnosis not present

## 2012-04-14 DIAGNOSIS — H31009 Unspecified chorioretinal scars, unspecified eye: Secondary | ICD-10-CM | POA: Diagnosis not present

## 2012-04-19 DIAGNOSIS — J301 Allergic rhinitis due to pollen: Secondary | ICD-10-CM | POA: Diagnosis not present

## 2012-04-19 DIAGNOSIS — K5289 Other specified noninfective gastroenteritis and colitis: Secondary | ICD-10-CM | POA: Diagnosis not present

## 2012-04-19 DIAGNOSIS — E039 Hypothyroidism, unspecified: Secondary | ICD-10-CM | POA: Diagnosis not present

## 2012-08-24 DIAGNOSIS — E039 Hypothyroidism, unspecified: Secondary | ICD-10-CM | POA: Diagnosis not present

## 2012-08-24 DIAGNOSIS — E559 Vitamin D deficiency, unspecified: Secondary | ICD-10-CM | POA: Diagnosis not present

## 2012-08-24 DIAGNOSIS — Z6834 Body mass index (BMI) 34.0-34.9, adult: Secondary | ICD-10-CM | POA: Diagnosis not present

## 2012-08-24 DIAGNOSIS — E785 Hyperlipidemia, unspecified: Secondary | ICD-10-CM | POA: Diagnosis not present

## 2012-08-24 DIAGNOSIS — E042 Nontoxic multinodular goiter: Secondary | ICD-10-CM | POA: Diagnosis not present

## 2012-09-07 DIAGNOSIS — Z85828 Personal history of other malignant neoplasm of skin: Secondary | ICD-10-CM | POA: Diagnosis not present

## 2012-09-07 DIAGNOSIS — L820 Inflamed seborrheic keratosis: Secondary | ICD-10-CM | POA: Diagnosis not present

## 2012-11-09 DIAGNOSIS — Z23 Encounter for immunization: Secondary | ICD-10-CM | POA: Diagnosis not present

## 2013-01-18 ENCOUNTER — Inpatient Hospital Stay (HOSPITAL_COMMUNITY): Payer: Medicare Other

## 2013-01-18 ENCOUNTER — Emergency Department (HOSPITAL_COMMUNITY): Payer: Medicare Other

## 2013-01-18 ENCOUNTER — Encounter (HOSPITAL_COMMUNITY): Payer: Self-pay | Admitting: Emergency Medicine

## 2013-01-18 ENCOUNTER — Inpatient Hospital Stay (HOSPITAL_COMMUNITY)
Admission: EM | Admit: 2013-01-18 | Discharge: 2013-01-20 | DRG: 153 | Disposition: A | Discharge status: Home / Self Care | Payer: Medicare Other | Attending: Endocrinology | Admitting: Endocrinology

## 2013-01-18 DIAGNOSIS — Z79899 Other long term (current) drug therapy: Secondary | ICD-10-CM

## 2013-01-18 DIAGNOSIS — E860 Dehydration: Secondary | ICD-10-CM | POA: Diagnosis present

## 2013-01-18 DIAGNOSIS — E785 Hyperlipidemia, unspecified: Secondary | ICD-10-CM | POA: Diagnosis not present

## 2013-01-18 DIAGNOSIS — M810 Age-related osteoporosis without current pathological fracture: Secondary | ICD-10-CM | POA: Diagnosis present

## 2013-01-18 DIAGNOSIS — E039 Hypothyroidism, unspecified: Secondary | ICD-10-CM | POA: Diagnosis not present

## 2013-01-18 DIAGNOSIS — R51 Headache: Secondary | ICD-10-CM | POA: Diagnosis not present

## 2013-01-18 DIAGNOSIS — R509 Fever, unspecified: Secondary | ICD-10-CM | POA: Diagnosis not present

## 2013-01-18 DIAGNOSIS — Z823 Family history of stroke: Secondary | ICD-10-CM | POA: Diagnosis not present

## 2013-01-18 DIAGNOSIS — R7309 Other abnormal glucose: Secondary | ICD-10-CM | POA: Diagnosis present

## 2013-01-18 DIAGNOSIS — E876 Hypokalemia: Secondary | ICD-10-CM | POA: Diagnosis present

## 2013-01-18 DIAGNOSIS — R05 Cough: Secondary | ICD-10-CM | POA: Diagnosis not present

## 2013-01-18 DIAGNOSIS — R404 Transient alteration of awareness: Secondary | ICD-10-CM | POA: Diagnosis present

## 2013-01-18 DIAGNOSIS — S0990XA Unspecified injury of head, initial encounter: Secondary | ICD-10-CM | POA: Diagnosis not present

## 2013-01-18 DIAGNOSIS — R109 Unspecified abdominal pain: Secondary | ICD-10-CM | POA: Diagnosis not present

## 2013-01-18 DIAGNOSIS — W19XXXA Unspecified fall, initial encounter: Secondary | ICD-10-CM | POA: Diagnosis present

## 2013-01-18 DIAGNOSIS — Z8249 Family history of ischemic heart disease and other diseases of the circulatory system: Secondary | ICD-10-CM

## 2013-01-18 DIAGNOSIS — J111 Influenza due to unidentified influenza virus with other respiratory manifestations: Secondary | ICD-10-CM | POA: Diagnosis not present

## 2013-01-18 DIAGNOSIS — R059 Cough, unspecified: Secondary | ICD-10-CM | POA: Diagnosis not present

## 2013-01-18 DIAGNOSIS — J09X2 Influenza due to identified novel influenza A virus with other respiratory manifestations: Secondary | ICD-10-CM | POA: Diagnosis present

## 2013-01-18 DIAGNOSIS — S32009A Unspecified fracture of unspecified lumbar vertebra, initial encounter for closed fracture: Secondary | ICD-10-CM | POA: Diagnosis not present

## 2013-01-18 DIAGNOSIS — S32000A Wedge compression fracture of unspecified lumbar vertebra, initial encounter for closed fracture: Secondary | ICD-10-CM | POA: Diagnosis present

## 2013-01-18 DIAGNOSIS — D649 Anemia, unspecified: Secondary | ICD-10-CM | POA: Diagnosis present

## 2013-01-18 DIAGNOSIS — R4182 Altered mental status, unspecified: Secondary | ICD-10-CM | POA: Diagnosis not present

## 2013-01-18 LAB — URINALYSIS, ROUTINE W REFLEX MICROSCOPIC
Bilirubin Urine: NEGATIVE
Glucose, UA: NEGATIVE mg/dL
Ketones, ur: 15 mg/dL — AB
Leukocytes, UA: NEGATIVE
Nitrite: NEGATIVE
Protein, ur: NEGATIVE mg/dL
Specific Gravity, Urine: >1.046 — ABNORMAL HIGH (ref 1.005–1.030)
Urobilinogen, UA: 0.2 mg/dL (ref 0.0–1.0)
pH: 6.5 (ref 5.0–8.0)

## 2013-01-18 LAB — CBC
HCT: 40.7 % (ref 36.0–46.0)
Hemoglobin: 13.9 g/dL (ref 12.0–15.0)
MCH: 31.4 pg (ref 26.0–34.0)
MCHC: 34.2 g/dL (ref 30.0–36.0)
MCV: 92.1 fL (ref 78.0–100.0)
Platelets: 152 10*3/uL (ref 150–400)
RBC: 4.42 MIL/uL (ref 3.87–5.11)
RDW: 12.7 % (ref 11.5–15.5)
WBC: 8.2 10*3/uL (ref 4.0–10.5)

## 2013-01-18 LAB — URINE MICROSCOPIC-ADD ON

## 2013-01-18 LAB — CSF CELL COUNT WITH DIFFERENTIAL
RBC Count, CSF: 1 /mm3 — ABNORMAL HIGH
RBC Count, CSF: 1 /mm3 — ABNORMAL HIGH
Tube #: 1
Tube #: 4
WBC, CSF: 3 /mm3 (ref 0–5)
WBC, CSF: 4 /mm3 (ref 0–5)

## 2013-01-18 LAB — COMPREHENSIVE METABOLIC PANEL
ALT: 13 U/L (ref 0–35)
AST: 24 U/L (ref 0–37)
Albumin: 4.3 g/dL (ref 3.5–5.2)
Alkaline Phosphatase: 96 U/L (ref 39–117)
BUN: 15 mg/dL (ref 6–23)
CO2: 25 mEq/L (ref 19–32)
Calcium: 9.2 mg/dL (ref 8.4–10.5)
Chloride: 97 mEq/L (ref 96–112)
Creatinine, Ser: 0.88 mg/dL (ref 0.50–1.10)
GFR calc Af Amer: 68 mL/min — ABNORMAL LOW (ref >90)
GFR calc non Af Amer: 58 mL/min — ABNORMAL LOW (ref >90)
Glucose, Bld: 121 mg/dL — ABNORMAL HIGH (ref 70–99)
Potassium: 3.8 mEq/L (ref 3.7–5.3)
Sodium: 137 mEq/L (ref 137–147)
Total Bilirubin: 0.6 mg/dL (ref 0.3–1.2)
Total Protein: 8 g/dL (ref 6.0–8.3)

## 2013-01-18 LAB — POCT I-STAT TROPONIN I: Troponin i, poc: 0 ng/mL (ref 0.00–0.08)

## 2013-01-18 LAB — GRAM STAIN: Gram Stain: NONE SEEN

## 2013-01-18 LAB — POCT I-STAT, CHEM 8
BUN: 16 mg/dL (ref 6–23)
Calcium, Ion: 1.16 mmol/L (ref 1.13–1.30)
Chloride: 101 mEq/L (ref 96–112)
Creatinine, Ser: 1 mg/dL (ref 0.50–1.10)
Glucose, Bld: 122 mg/dL — ABNORMAL HIGH (ref 70–99)
HCT: 44 % (ref 36.0–46.0)
Hemoglobin: 15 g/dL (ref 12.0–15.0)
Potassium: 3.7 mEq/L (ref 3.7–5.3)
Sodium: 140 mEq/L (ref 137–147)
TCO2: 27 mmol/L (ref 0–100)

## 2013-01-18 LAB — CG4 I-STAT (LACTIC ACID): Lactic Acid, Venous: 1.01 mmol/L (ref 0.5–2.2)

## 2013-01-18 LAB — GLUCOSE, CSF: Glucose, CSF: 67 mg/dL (ref 43–76)

## 2013-01-18 LAB — INFLUENZA PANEL BY PCR (TYPE A & B)
H1N1 flu by pcr: NOT DETECTED
Influenza A By PCR: POSITIVE — AB
Influenza B By PCR: NEGATIVE

## 2013-01-18 LAB — PROTEIN, CSF: Total  Protein, CSF: 29 mg/dL (ref 15–45)

## 2013-01-18 LAB — LIPASE, BLOOD: Lipase: 17 U/L (ref 11–59)

## 2013-01-18 MED ORDER — IOHEXOL 300 MG/ML  SOLN
100.0000 mL | Freq: Once | INTRAMUSCULAR | Status: AC | PRN
  Administered 2013-01-18: 100 mL via INTRAVENOUS

## 2013-01-18 MED ORDER — IOHEXOL 300 MG/ML  SOLN
50.0000 mL | Freq: Once | INTRAMUSCULAR | Status: AC | PRN
  Administered 2013-01-18: 50 mL via ORAL

## 2013-01-18 MED ORDER — LORAZEPAM 2 MG/ML IJ SOLN
1.0000 mg | Freq: Once | INTRAMUSCULAR | Status: AC
  Administered 2013-01-18: 1 mg via INTRAVENOUS
  Filled 2013-01-18: qty 1

## 2013-01-18 MED ORDER — ACETAMINOPHEN 325 MG PO TABS
650.0000 mg | ORAL_TABLET | Freq: Four times a day (QID) | ORAL | Status: DC | PRN
  Administered 2013-01-18 – 2013-01-19 (×3): 650 mg via ORAL
  Filled 2013-01-18 (×3): qty 2

## 2013-01-18 MED ORDER — OSELTAMIVIR PHOSPHATE 75 MG PO CAPS
75.0000 mg | ORAL_CAPSULE | Freq: Every day | ORAL | Status: DC
  Administered 2013-01-18 – 2013-01-20 (×3): 75 mg via ORAL
  Filled 2013-01-18 (×3): qty 1

## 2013-01-18 MED ORDER — ACETAMINOPHEN 325 MG PO TABS
650.0000 mg | ORAL_TABLET | Freq: Once | ORAL | Status: AC
  Administered 2013-01-18: 650 mg via ORAL
  Filled 2013-01-18: qty 2

## 2013-01-18 MED ORDER — SODIUM CHLORIDE 0.9 % IV SOLN
INTRAVENOUS | Status: DC
  Administered 2013-01-18: 100 mL/h via INTRAVENOUS
  Administered 2013-01-19: 04:00:00 via INTRAVENOUS

## 2013-01-18 MED ORDER — SODIUM CHLORIDE 0.9 % IV BOLUS (SEPSIS)
1000.0000 mL | Freq: Once | INTRAVENOUS | Status: AC
  Administered 2013-01-18: 1000 mL via INTRAVENOUS

## 2013-01-18 NOTE — ED Notes (Signed)
Pt transported to IR for Lumbar Puncture. 4W called and notified of delay in bringing pt to floor. Report has already been given by Loyal Jacobson, RN.

## 2013-01-18 NOTE — H&P (Signed)
PCP:   Bonney Aid M.D.   Chief Complaint:  Confusion and fall  HPI: This is an 80 year old white female was in her usual state of excellent health. She talks in a daily basis to her daughter and spoke with her last had a BM yesterday morning. No difficulties or complaints are noted in the time period when her daughter called this morning however there was no answer the phone. They went and broke into her house and found her sitting on the floor leaned up against the couch as if she tried to sit down and fell on the floor instead. She's complaining of a mild headache. Her lites were off and the fireplace was on and her husband was asleep in the chair beside her. She was clearly confused beyond her baseline of completely normal mental status and couldn't really tell her what happened. She did communicate that she tried to get her husband to give her the phone but he couldn't do this. We have no other clues as of the time course. Her husband was in his 70. Upon arrival here to the emergency room she's been confused at times. She's given conflicting answers as to whether she has any pain. She's been confused as to situation and date. She told her daughter she had some head pain earlier but this is gone. An extensive and excellent workup was done in the emergency room with findings of fever but no leukocytosis and no obvious focal source. Based on her mental status changes and no source for fever lumbar puncture was done and results are pending. In the interim, her influenza A swab was come back positive. Tamiflu has been ordered. At the moment the patient is sedated after the Ativan given for her lumbar puncture and I can't get much information from her. Information provided by her grand daughter at the bedside. They report she was doing well over the Christmas holidays other than some back pain. She treated this to having to lift her husband. They report no unusual behavior illnesses or travel. She's  never had any episode like this. There is no evidence of any head trauma. Her CT were head was negative.  Review of Systems:  Review of Systems - unobtainable Past Medical History: Past Medical History  Diagnosis Date  . Hypothyroidism   Past Medical History (reviewed - no changes required): Osteoporosis MNG/hypothyroid Hyperlipidemia Pneumonia x2   vit D defic 2003: colon:  Healthy-appearing colon up to the cecum except for smallnonbleeding internal and external hemorrhoids. colitis  Past Surgical History  Procedure Laterality Date  . Left knee surgery    . Cataract extraction, bilateral    . Cholecystectomy     Surgical History (reviewed - no changes required): GB Hernia repair 2003:  Incisional hernia repair with mesh. Medications: Prior to Admission medications   Medication Sig Start Date End Date Taking? Authorizing Provider  ezetimibe-simvastatin (VYTORIN) 10-40 MG per tablet Take 1 tablet by mouth at bedtime.   Yes Historical Provider, MD  levothyroxine (SYNTHROID, LEVOTHROID) 88 MCG tablet Take 88 mcg by mouth every morning.    Yes Historical Provider, MD  Multiple Vitamin (MULITIVITAMIN WITH MINERALS) TABS Take 1 tablet by mouth every morning.    Yes Historical Provider, MD    Allergies:   Allergies  Allergen Reactions  . Sulfonamide Derivatives Hives  . Codeine Other (See Comments)    Passes out  . Latex     Social History:  reports that she has never smoked. She has  never used smokeless tobacco. She reports that she does not drink alcohol or use illicit drugs. Social History (reviewed - no changes required): Married 1948 (Ed) 2 children  4 GC 1 ggc  Family History:  Father died at 1 from an MI Mother died at 2 from CVA Physical Exam: Filed Vitals:   01/18/13 1047 01/18/13 1300 01/18/13 1445 01/18/13 1500  BP: 139/92 159/77 155/115 140/81  Pulse: 88 84 86 85  Temp: 100.1 F (37.8 C)     TempSrc: Oral     Resp: 18 19 24 13   Height: 5\' 3"  (1.6 m)      Weight: 84.369 kg (186 lb)     SpO2: 94% 95% 96% 97%   General appearance: Nontoxic appearing white female lying flat on her back in no apparent distress. She keeps her eyes closed. No evidence of head trauma is present. Oral mucous membranes are moist. No evidence of tongue or teeth trauma is present. Neck: no adenopathy, no carotid bruit, no JVD and thyroid not enlarged, symmetric, no tenderness/mass/nodules Resp: Occasional adventitious sounds are heard. No wheezing is present. No accessory muscles are in use. Cardio: Regular with a soft murmur. GI: Slightly distended soft, non-tender; bowel sounds hypoactive; no masses,  no organomegaly Extremities: Minimal bruising of the back of her hand is noted. No angulation or deformity of any of her extremities is noted Lymph nodes: Cervical adenopathy: no cervical lymphadenopathy Neurologic: The patient is somnolent and hard to arouse after the Ativan. See reports above  Labs on Admission:   Recent Labs  01/18/13 1046 01/18/13 1116  NA 137 140  K 3.8 3.7  CL 97 101  CO2 25  --   GLUCOSE 121* 122*  BUN 15 16  CREATININE 0.88 1.00  CALCIUM 9.2  --     Recent Labs  01/18/13 1046  AST 24  ALT 13  ALKPHOS 96  BILITOT 0.6  PROT 8.0  ALBUMIN 4.3    Recent Labs  01/18/13 1046  LIPASE 17    Recent Labs  01/18/13 1046 01/18/13 1116  WBC 8.2  --   HGB 13.9 15.0  HCT 40.7 44.0  MCV 92.1  --   PLT 152  --         Radiological Exams on Admission: Dg Chest 2 View  01/18/2013   CLINICAL DATA:  Cough, fever  EXAM: CHEST  2 VIEW  COMPARISON:  DG CHEST 2 VIEW dated 04/29/2011  FINDINGS: The heart size and mediastinal contours are within normal limits. Both lungs are clear. The visualized skeletal structures are unremarkable.  IMPRESSION: No active cardiopulmonary disease.   Electronically Signed   By: Kathreen Devoid   On: 01/18/2013 11:30   Ct Head Wo Contrast  01/18/2013   CLINICAL DATA:  fall, headaches, confusion  EXAM:  CT HEAD WITHOUT CONTRAST  TECHNIQUE: Contiguous axial images were obtained from the base of the skull through the vertex without intravenous contrast.  COMPARISON:  CT HEAD W/O CM dated 04/29/2011  FINDINGS: There is no evidence of mass effect, midline shift, or extra-axial fluid collections. There is no evidence of a space-occupying lesion or intracranial hemorrhage. There is no evidence of a cortical-based area of acute infarction. There is generalized cerebral atrophy. There is periventricular white matter low attenuation likely secondary to microangiopathy.  The ventricles and sulci are appropriate for the patient's age. The basal cisterns are patent.  Visualized portions of the orbits are unremarkable. The visualized portions of the paranasal sinuses and mastoid  air cells are unremarkable.  The osseous structures are unremarkable.  IMPRESSION: No acute intracranial pathology.   Electronically Signed   By: Kathreen Devoid   On: 01/18/2013 11:51   Ct Abdomen Pelvis W Contrast  01/18/2013   CLINICAL DATA:  Abdominal pain.  Fever.  Altered mental status.  EXAM: CT ABDOMEN AND PELVIS WITH CONTRAST  TECHNIQUE: Multidetector CT imaging of the abdomen and pelvis was performed using the standard protocol following bolus administration of intravenous contrast.  CONTRAST:  163mL OMNIPAQUE IOHEXOL 300 MG/ML  SOLN  COMPARISON:  03/18/2012 CT.  FINDINGS: Minimal scarring/subsegmental atelectatic changes lung bases.  Narrowing of the mid to distal ascending colon. It is possible this is related to under distension however, this may reflect stricture from prior inflammation as patient was noted to have colitis at this level previously. Currently, no extra luminal bowel inflammatory process, free fluid or free air is noted.  Mild thickening distal esophagus. This may be related to under distension or reflux. No well-defined mass identified.  Post cholecystectomy. No focal hepatic, splenic, pancreatic or renal lesion. Parapelvic  left renal cyst noted. Nodularity adrenal gland unchanged more notable on the right possibly small adenomas.  Atherosclerotic type changes of the abdominal aorta with and iliac arteries without aneurysmal dilation.  No adenopathy.  Non contrast filled views of the urinary bladder unremarkable.  Suggestion of mild prominence of the endometrium. This can be assessed with pelvic sonogram. Mild fullness of the left adnexae without discrete mass.  New from the prior examination is L3 superior endplate mild compression fracture with 10% lost height. This does not appear healed and may be acute/subacute. Minimal retropulsion on the left. Bulge. Spinal stenosis greater on the left. Multifactorial moderate L4-5 spinal stenosis is. Minimal anterior slip L4 secondary to facet degenerative changes.  Moderate right hip joint degenerative changes with subchondral cyst.  IMPRESSION: Narrowing of the mid to distal ascending colon. It is possible this is related to under distension however, this may reflect stricture from prior inflammation as patient was noted to have colitis at this level previously. Currently, no extra luminal bowel inflammatory process, free fluid or free air is noted.  Mild thickening distal esophagus. This may be related to under distension or reflux. No well-defined mass identified.  Nodularity adrenal gland unchanged more notable on the right possibly small adenomas.  Suggestion of mild prominence of the endometrium. This can be assessed with pelvic sonogram. Mild fullness of the left adnexae without discrete mass.  New from the prior examination is L3 superior endplate mild compression fracture with 10% lost height. This does not appear healed and may be acute/subacute. Minimal retropulsion on the left. Bulge. Spinal stenosis greater on the left.  Multifactorial moderate L4-5 spinal stenosis.  Moderate right hip joint degenerative changes with subchondral cysts.   Electronically Signed   By: Chauncey Cruel  M.D.   On: 01/18/2013 12:51   Orders placed during the hospital encounter of 01/18/13  . EKG 12-LEAD: 18-Jan-2013 11:27:57 [facility] ED ROUTINE RECORD Sinus rhythm RSR' in V1 or V2, right VCD or RVH Inferior infarct, age indeterminate T wave flattening in inferior and lateral leads changed from previous  .    Marland Kitchen   Results for BEVIN, ANDINO (MRN [id number]) as of 01/18/2013 18:00  Ref. Range 01/18/2013 14:08  Influenza A By PCR Latest Range: NEGATIVE  POSITIVE (A)  Influenza B By PCR Latest Range: NEGATIVE  NEGATIVE  H1N1 flu by pcr Latest  Range: NOT DETECTED  NOT DETECTED    Assessment/Plan   Altered mental status: This is a clear departure from her usual excellent mental status. There is no evidence of any seizure activity but we are really in the dark about what exactly happened. She did not appear to have a head injury. Her initial CT is negative. The lumbar puncture was warranted but no results are yet resulted. This could be an unusual neurologic manifestations or influenza that she is now positive for. She's been started on Tamiflu. Off had neurology consulted and will see whether we need to consider any other antivirals we were waiting for results. She may need an MRI to look better temporal lobes and for any evolving changes. Again a letter neurology decide on this.  Active Problems:   Hypothyroid: The patient is done well on this. We'll hold her medicines for a day or 2 while she is recovering   Fever: She has no leukocytosis, her chest x-ray is negative, a urine is negative, and there is no evidence of any skin rashes. Lumbar puncture results are pending   Lumbar compression fracture: This appears to be a  subacute problem. Is only a 10% lesion and would not warrant kyphoplasty   Fall: This is presumed on historical grounds Mild elevation of blood sugar: We'll watch for now Dehydration: We'll give her some gentle fluids. DVT prophylaxis: Since she's had the  lumbar puncture and a possible fall we'll use compression stockings CODE STATUS is full   Shaquandra Galano ALAN 01/18/2013, 4:42 PM

## 2013-01-18 NOTE — ED Notes (Signed)
CSF walked down to lab and handed to phlebotomist with requisitions. Pt taken upstairs by Loyal Jacobson, RN.

## 2013-01-18 NOTE — ED Notes (Signed)
Pt was found on floor altered. PA at bedside

## 2013-01-18 NOTE — ED Provider Notes (Signed)
CSN: [id number]     Arrival date & time 01/18/13  1026 History   First MD Initiated Contact with Patient 01/18/13 1035     Chief Complaint  Patient presents with  . Fall   (Consider location/radiation/quality/duration/timing/severity/associated sxs/prior Treatment) HPI Comments: Patient is an 80 year old female with a past medical history of hypothyroidism who presents to the emergency department with her daughter and granddaughter with altered mental status. Patient's daughter states she tried to call patient earlier this morning, however she did not answer for about an hour and a half, went over to her house around 9:00 and found her sitting on the floor next to the couch, alert but disoriented from her normal. His patient lives at home alone with her husband and does her husband's primary caregiver, normally completely alert and able to manage herself on a daily basis. Family states patient seems altered and "not with it". Patient began complaining of cough and abdominal pain this morning, family states abdomen appears distended. Denies changes in urination or bowel habits. Unsure if she has had a fever, however states anytime she has a fever she becomes altered. Patient qualifies as a level V caveat due to altered mental status.  Patient is a 80 y.o. female presenting with fall. The history is provided by the patient and a relative.  Fall    Past Medical History  Diagnosis Date  . Hypothyroidism    History reviewed. No pertinent past surgical history. No family history on file. History  Substance Use Topics  . Smoking status: Never Smoker   . Smokeless tobacco: Not on file  . Alcohol Use: No   OB History   Grav Para Term Preterm Abortions TAB SAB Ect Mult Living                 Review of Systems  Unable to perform ROS: Mental status change    Allergies  Sulfonamide derivatives; Codeine; and Latex  Home Medications   Current Outpatient Rx  Name  Route  Sig  Dispense   Refill  . ezetimibe-simvastatin (VYTORIN) 10-40 MG per tablet   Oral   Take 1 tablet by mouth at bedtime.         Marland Kitchen levothyroxine (SYNTHROID, LEVOTHROID) 88 MCG tablet   Oral   Take 88 mcg by mouth every morning.          . Multiple Vitamin (MULITIVITAMIN WITH MINERALS) TABS   Oral   Take 1 tablet by mouth every morning.           BP 159/77  Pulse 84  Temp(Src) 100.1 F (37.8 C) (Oral)  Resp 19  Ht 5\' 3"  (1.6 m)  Wt 186 lb (84.369 kg)  BMI 32.96 kg/m2  SpO2 95% Physical Exam  Nursing note and vitals reviewed. Constitutional: She is oriented to person, place, and time. She appears well-developed and well-nourished. No distress.  HENT:  Head: Normocephalic and atraumatic.  Mouth/Throat: Oropharynx is clear and moist. Mucous membranes are pale.  Eyes: Conjunctivae and EOM are normal. Pupils are equal, round, and reactive to light.  Neck: Normal range of motion. Neck supple.  Cardiovascular: Normal rate, regular rhythm and normal heart sounds.   Pulmonary/Chest: Effort normal and breath sounds normal. No respiratory distress. She has no wheezes. She has no rhonchi. She has no rales.  Abdominal: Soft. Bowel sounds are normal. She exhibits distension. There is tenderness in the periumbilical area. There is no rigidity, no rebound and no guarding.  No peritoneal signs.  Musculoskeletal: Normal range of motion. She exhibits no edema.  Neurological: She is alert and oriented to person, place, and time. She has normal strength. No cranial nerve deficit or sensory deficit. Coordination normal. GCS eye subscore is 4. GCS verbal subscore is 5. GCS motor subscore is 6.  Skin: Skin is warm and dry. She is not diaphoretic.  Psychiatric: She has a normal mood and affect. Her behavior is normal.    ED Course  Procedures (including critical care time) Labs Review Labs Reviewed  COMPREHENSIVE METABOLIC PANEL - Abnormal; Notable for the following:    Glucose, Bld 121 (*)    GFR calc  non Af Amer 58 (*)    GFR calc Af Amer 68 (*)    All other components within normal limits  URINALYSIS, ROUTINE W REFLEX MICROSCOPIC - Abnormal; Notable for the following:    Specific Gravity, Urine >1.046 (*)    Hgb urine dipstick SMALL (*)    Ketones, ur 15 (*)    All other components within normal limits  POCT I-STAT, CHEM 8 - Abnormal; Notable for the following:    Glucose, Bld 122 (*)    All other components within normal limits  CULTURE, BLOOD (ROUTINE X 2)  CULTURE, BLOOD (ROUTINE X 2)  CBC  LIPASE, BLOOD  URINE MICROSCOPIC-ADD ON  INFLUENZA PANEL BY PCR (TYPE A & B, H1N1)  CG4 I-STAT (LACTIC ACID)  POCT I-STAT TROPONIN I   Imaging Review Dg Chest 2 View  01/18/2013   CLINICAL DATA:  Cough, fever  EXAM: CHEST  2 VIEW  COMPARISON:  DG CHEST 2 VIEW dated 04/29/2011  FINDINGS: The heart size and mediastinal contours are within normal limits. Both lungs are clear. The visualized skeletal structures are unremarkable.  IMPRESSION: No active cardiopulmonary disease.   Electronically Signed   By: Kathreen Devoid   On: 01/18/2013 11:30   Ct Head Wo Contrast  01/18/2013   CLINICAL DATA:  fall, headaches, confusion  EXAM: CT HEAD WITHOUT CONTRAST  TECHNIQUE: Contiguous axial images were obtained from the base of the skull through the vertex without intravenous contrast.  COMPARISON:  CT HEAD W/O CM dated 04/29/2011  FINDINGS: There is no evidence of mass effect, midline shift, or extra-axial fluid collections. There is no evidence of a space-occupying lesion or intracranial hemorrhage. There is no evidence of a cortical-based area of acute infarction. There is generalized cerebral atrophy. There is periventricular white matter low attenuation likely secondary to microangiopathy.  The ventricles and sulci are appropriate for the patient's age. The basal cisterns are patent.  Visualized portions of the orbits are unremarkable. The visualized portions of the paranasal sinuses and mastoid air cells are  unremarkable.  The osseous structures are unremarkable.  IMPRESSION: No acute intracranial pathology.   Electronically Signed   By: Kathreen Devoid   On: 01/18/2013 11:51   Ct Abdomen Pelvis W Contrast  01/18/2013   CLINICAL DATA:  Abdominal pain.  Fever.  Altered mental status.  EXAM: CT ABDOMEN AND PELVIS WITH CONTRAST  TECHNIQUE: Multidetector CT imaging of the abdomen and pelvis was performed using the standard protocol following bolus administration of intravenous contrast.  CONTRAST:  153mL OMNIPAQUE IOHEXOL 300 MG/ML  SOLN  COMPARISON:  03/18/2012 CT.  FINDINGS: Minimal scarring/subsegmental atelectatic changes lung bases.  Narrowing of the mid to distal ascending colon. It is possible this is related to under distension however, this may reflect stricture from prior inflammation as patient was noted to have colitis at this level  previously. Currently, no extra luminal bowel inflammatory process, free fluid or free air is noted.  Mild thickening distal esophagus. This may be related to under distension or reflux. No well-defined mass identified.  Post cholecystectomy. No focal hepatic, splenic, pancreatic or renal lesion. Parapelvic left renal cyst noted. Nodularity adrenal gland unchanged more notable on the right possibly small adenomas.  Atherosclerotic type changes of the abdominal aorta with and iliac arteries without aneurysmal dilation.  No adenopathy.  Non contrast filled views of the urinary bladder unremarkable.  Suggestion of mild prominence of the endometrium. This can be assessed with pelvic sonogram. Mild fullness of the left adnexae without discrete mass.  New from the prior examination is L3 superior endplate mild compression fracture with 10% lost height. This does not appear healed and may be acute/subacute. Minimal retropulsion on the left. Bulge. Spinal stenosis greater on the left. Multifactorial moderate L4-5 spinal stenosis is. Minimal anterior slip L4 secondary to facet degenerative  changes.  Moderate right hip joint degenerative changes with subchondral cyst.  IMPRESSION: Narrowing of the mid to distal ascending colon. It is possible this is related to under distension however, this may reflect stricture from prior inflammation as patient was noted to have colitis at this level previously. Currently, no extra luminal bowel inflammatory process, free fluid or free air is noted.  Mild thickening distal esophagus. This may be related to under distension or reflux. No well-defined mass identified.  Nodularity adrenal gland unchanged more notable on the right possibly small adenomas.  Suggestion of mild prominence of the endometrium. This can be assessed with pelvic sonogram. Mild fullness of the left adnexae without discrete mass.  New from the prior examination is L3 superior endplate mild compression fracture with 10% lost height. This does not appear healed and may be acute/subacute. Minimal retropulsion on the left. Bulge. Spinal stenosis greater on the left.  Multifactorial moderate L4-5 spinal stenosis.  Moderate right hip joint degenerative changes with subchondral cysts.   Electronically Signed   By: Chauncey Cruel M.D.   On: 01/18/2013 12:51    EKG Interpretation    Date/Time:  Tuesday January 18 2013 11:27:57 EST Ventricular Rate:  85 PR Interval:  156 QRS Duration: 101 QT Interval:  405 QTC Calculation: 482 R Axis:   31 Text Interpretation:  Sinus rhythm RSR' in V1 or V2, right VCD or RVH Inferior infarct, age indeterminate T wave flattening in inferior and lateral leads changed from previous Confirmed by YAO  MD, DAVID [phone]) on 01/18/2013 11:38:15 AM            MDM   1. Altered mental status   2. Fall   3. Fever of unknown origin     Patient presenting with altered mental status, cough and abdominal distention. She appears in no apparent distress. Febrile with temperature of 100.1. Will check rectal temperature. Vitals otherwise stable. Disoriented according  to family members. Unsure of head injury. Labs pending- cbc, cmp, lipase, blood culture, lactate, troponin, ua. CT head, CXR and abdominal CT pending. IV fluids. Plan to admit. Case discussed with attending Dr. Darl Householder who also evaluated patient and agrees with plan of care. 2:19 PM UA clear, labs at baseline, no explanation of fever. CXR clear, CT head normal. Abdominal CT without obvious signs of infection. No meningeal signs or headache. Influenza by PCR pending. Given fever, AMS, will admit pt. Dr. Darl Householder agreeable. 3:48 PM After speaking with Dr. Forde Dandy, he advises LP, however given new compression fracture at L3,  pt will have LP under fleuro guidance. Pt admitted to Dr. Forde Dandy. If CSF cultures result prior to pt moving to the floor, will update Dr. Forde Dandy. I discussed pt with Kirichenko, PA-C at shift change who will watch for results.  Illene Labrador, PA-C 01/18/13 [phone]

## 2013-01-18 NOTE — Consult Note (Signed)
Neurology Consultation Reason for Consult: Altered mental status Referring Physician: Reynold Bowen  CC: Altered mental status  History is obtained from: Patient, daughter  HPI: Traci Richardson is a 80 y.o. female with a history of hypothyroidism who was in her normal state when her daughter talked to her yesterday on the phone, but did not answer the phone this morning. Her daughter went by to check on her after she did not answer the phone and found her confused, with the lights off in the house. She brought her into the emergency room where was noted that she had a temperature 100.1, and therefore I lumbar puncture was performed to rule out CNS infection. This shows 4 WBC, normal protein and glucose.  She received some Ativan, but appears to be waking up some now. He is able to tell me that she feels bad, and has felt this way since yesterday.  Her daughter endorses the patient having previous episodes of confusion with mild infections. The patient has a positive flu swab  Also of note, the day after Christmas she had to catch her husband and had acute onset of back pain at that time. She has been taking ibuprofen with moderately good control since then. A CT performed today shows a compression fracture at L3.  ROS: A 14 point ROS was performed and is negative except as noted in the HPI.  Past Medical History  Diagnosis Date  . Hypothyroidism    Social History: Lives with husband and is his primary caregiver  Exam: Current vital signs: BP 126/66  Pulse 71  Temp(Src) 98 F (36.7 C) (Oral)  Resp 18  Ht 5\' 3"  (1.6 m)  Wt 85.9 kg (189 lb 6 oz)  BMI 33.55 kg/m2  SpO2 98% Vital signs in last 24 hours: Temp:  [98 F (36.7 C)-100.1 F (37.8 C)] 98 F (36.7 C) (01/13 1714) Pulse Rate:  [71-88] 71 (01/13 1714) Resp:  [13-24] 18 (01/13 1714) BP: (126-159)/(66-115) 126/66 mmHg (01/13 1714) SpO2:  [94 %-98 %] 98 % (01/13 1714) Weight:  [84.369 kg (186 lb)-85.9 kg (189 lb 6 oz)]  85.9 kg (189 lb 6 oz) (01/13 1714)  General: In bed, NAD CV: Regular rate and rhythm Mental Status: Patient is awake, alert, oriented to person, gives the year as 1915, but says "yes that's right" when I correct her to 2015. She thinks that she is at [facility]. She has no evidence of aphasia or neglect Cranial Nerves: II: Visual Fields are full. Pupils are equal, round, and reactive to light.   III,IV, VI: EOMI without ptosis or diploplia.  V: Facial sensation is symmetric to temperature VII: Facial movement is symmetric.  VIII: hearing is intact to voice X: Uvula elevates symmetrically XI: Shoulder shrug is symmetric. XII: tongue is midline without atrophy or fasciculations.  Motor: Tone is normal. Bulk is normal. 5/5 strength was present in all four extremities.  Sensory: Sensation is symmetric to light touch and temperature in the arms and legs. Deep Tendon Reflexes: 2+ and symmetric in the biceps and patellae.  Cerebellar: No clear ataxia when reaching for objects   I have reviewed labs in epic and the results pertinent to this consultation are: CSF WBC 3 CSF RBC 1 CSF protein 29 CSF glucose 67   I have reviewed the images obtained: CT head-unremarkable   Impression:  80 year old female with mild confusion in the setting of influenza. With her history of confusion with mild infections, and normal lumbar puncture results, and  nonlocalizing exam, I strongly suspect that this represents a mild delirium in the setting of infection. I would expect it to clear as the infection clears. I would not pursue any further testing at this time as I suspect it will be low yield.  Recommendations: 1)  agree with Tamiflu for influenza treatment  2) no further recommendations at this time, neurology will sign off. Please call if there are any further questions, or the patient is not improving as would be expected with recovery from flu.    Roland Rack, MD [facility] [phone]  If 7pm- 7am, please page neurology on call at [phone].

## 2013-01-18 NOTE — ED Notes (Signed)
MD at bedside. 

## 2013-01-19 DIAGNOSIS — J111 Influenza due to unidentified influenza virus with other respiratory manifestations: Secondary | ICD-10-CM | POA: Diagnosis not present

## 2013-01-19 DIAGNOSIS — E785 Hyperlipidemia, unspecified: Secondary | ICD-10-CM | POA: Diagnosis not present

## 2013-01-19 DIAGNOSIS — R509 Fever, unspecified: Secondary | ICD-10-CM | POA: Diagnosis not present

## 2013-01-19 DIAGNOSIS — E039 Hypothyroidism, unspecified: Secondary | ICD-10-CM | POA: Diagnosis not present

## 2013-01-19 DIAGNOSIS — S32009A Unspecified fracture of unspecified lumbar vertebra, initial encounter for closed fracture: Secondary | ICD-10-CM | POA: Diagnosis not present

## 2013-01-19 DIAGNOSIS — R4182 Altered mental status, unspecified: Secondary | ICD-10-CM | POA: Diagnosis not present

## 2013-01-19 LAB — COMPREHENSIVE METABOLIC PANEL
ALT: 10 U/L (ref 0–35)
AST: 19 U/L (ref 0–37)
Albumin: 3.1 g/dL — ABNORMAL LOW (ref 3.5–5.2)
Alkaline Phosphatase: 68 U/L (ref 39–117)
BUN: 11 mg/dL (ref 6–23)
CO2: 24 mEq/L (ref 19–32)
Calcium: 7.7 mg/dL — ABNORMAL LOW (ref 8.4–10.5)
Chloride: 103 mEq/L (ref 96–112)
Creatinine, Ser: 0.78 mg/dL (ref 0.50–1.10)
GFR calc Af Amer: 86 mL/min — ABNORMAL LOW (ref >90)
GFR calc non Af Amer: 74 mL/min — ABNORMAL LOW (ref >90)
Glucose, Bld: 114 mg/dL — ABNORMAL HIGH (ref 70–99)
Potassium: 3.1 mEq/L — ABNORMAL LOW (ref 3.7–5.3)
Sodium: 139 mEq/L (ref 137–147)
Total Bilirubin: 0.4 mg/dL (ref 0.3–1.2)
Total Protein: 5.9 g/dL — ABNORMAL LOW (ref 6.0–8.3)

## 2013-01-19 LAB — CBC
HCT: 33.7 % — ABNORMAL LOW (ref 36.0–46.0)
Hemoglobin: 11.2 g/dL — ABNORMAL LOW (ref 12.0–15.0)
MCH: 30.9 pg (ref 26.0–34.0)
MCHC: 33.2 g/dL (ref 30.0–36.0)
MCV: 92.8 fL (ref 78.0–100.0)
Platelets: 120 10*3/uL — ABNORMAL LOW (ref 150–400)
RBC: 3.63 MIL/uL — ABNORMAL LOW (ref 3.87–5.11)
RDW: 13 % (ref 11.5–15.5)
WBC: 5.1 10*3/uL (ref 4.0–10.5)

## 2013-01-19 LAB — TSH: TSH: 2.249 u[IU]/mL (ref 0.350–4.500)

## 2013-01-19 MED ORDER — ADULT MULTIVITAMIN W/MINERALS CH
1.0000 | ORAL_TABLET | Freq: Every morning | ORAL | Status: DC
  Administered 2013-01-19 – 2013-01-20 (×2): 1 via ORAL
  Filled 2013-01-19 (×2): qty 1

## 2013-01-19 MED ORDER — EZETIMIBE-SIMVASTATIN 10-40 MG PO TABS
1.0000 | ORAL_TABLET | Freq: Every day | ORAL | Status: DC
  Administered 2013-01-19: 1 via ORAL
  Filled 2013-01-19 (×2): qty 1

## 2013-01-19 MED ORDER — POTASSIUM CHLORIDE CRYS ER 20 MEQ PO TBCR
40.0000 meq | EXTENDED_RELEASE_TABLET | Freq: Every day | ORAL | Status: DC
  Administered 2013-01-19 – 2013-01-20 (×2): 40 meq via ORAL
  Filled 2013-01-19 (×2): qty 2

## 2013-01-19 MED ORDER — CALCITONIN (SALMON) 200 UNIT/ACT NA SOLN
1.0000 | Freq: Every day | NASAL | Status: DC
  Administered 2013-01-19 – 2013-01-20 (×2): 1 via NASAL
  Filled 2013-01-19: qty 3.7

## 2013-01-19 MED ORDER — TRAMADOL HCL 50 MG PO TABS: 50.0000 mg | ORAL_TABLET | Freq: Three times a day (TID) | ORAL | Status: DC | PRN

## 2013-01-19 MED ORDER — LEVOTHYROXINE SODIUM 88 MCG PO TABS
88.0000 ug | ORAL_TABLET | Freq: Every day | ORAL | Status: DC
  Administered 2013-01-19 – 2013-01-20 (×2): 88 ug via ORAL
  Filled 2013-01-19 (×3): qty 1

## 2013-01-19 NOTE — Progress Notes (Signed)
Subjective: Had a good night. Now lucid. Some fever at [place] but none since. Min cough. Some abd fullness. Has urinated and had a BM. Some back pain   Objective: Vital signs in last 24 hours: Temp:  [98 F (36.7 C)-100.6 F (38.1 C)] 98.8 F (37.1 C) (01/14 0616) Pulse Rate:  [65-88] 65 (01/14 0616) Resp:  [13-24] 20 (01/14 0616) BP: (126-159)/(66-115) 151/75 mmHg (01/14 0616) SpO2:  [94 %-98 %] 97 % (01/14 0616) Weight:  [84.369 kg (186 lb)-85.9 kg (189 lb 6 oz)] 85.9 kg (189 lb 6 oz) (01/13 1714)  Intake/Output from previous day: 01/13 0701 - 01/14 0700 In: 1241.7 [I.V.:1241.7] Out: 461 [Urine:461] Intake/Output this shift:    Lying flat in no distress. Moist oral membranes. Lungs clear. Ht regular abd a bit distended. Awake, alert. Back to normal mentation, in good spirits  Lab Results   Recent Labs  01/18/13 1046 01/18/13 1116 01/19/13 0550  WBC 8.2  --  5.1  RBC 4.42  --  3.63*  HGB 13.9 15.0 11.2*  HCT 40.7 44.0 33.7*  MCV 92.1  --  92.8  MCH 31.4  --  30.9  RDW 12.7  --  13.0  PLT 152  --  120*    Recent Labs  01/18/13 1046 01/18/13 1116 01/19/13 0550  NA 137 140 139  K 3.8 3.7 3.1*  CL 97 101 103  CO2 25  --  24  GLUCOSE 121* 122* 114*  BUN 15 16 11   CREATININE 0.88 1.00 0.78  CALCIUM 9.2  --  7.7*    Studies/Results: Dg Chest 2 View  01/18/2013   CLINICAL DATA:  Cough, fever  EXAM: CHEST  2 VIEW  COMPARISON:  DG CHEST 2 VIEW dated 04/29/2011  FINDINGS: The heart size and mediastinal contours are within normal limits. Both lungs are clear. The visualized skeletal structures are unremarkable.  IMPRESSION: No active cardiopulmonary disease.   Electronically Signed   By: Kathreen Devoid   On: 01/18/2013 11:30   Ct Head Wo Contrast  01/18/2013   CLINICAL DATA:  fall, headaches, confusion  EXAM: CT HEAD WITHOUT CONTRAST  TECHNIQUE: Contiguous axial images were obtained from the base of the skull through the vertex without intravenous contrast.  COMPARISON:  CT  HEAD W/O CM dated 04/29/2011  FINDINGS: There is no evidence of mass effect, midline shift, or extra-axial fluid collections. There is no evidence of a space-occupying lesion or intracranial hemorrhage. There is no evidence of a cortical-based area of acute infarction. There is generalized cerebral atrophy. There is periventricular white matter low attenuation likely secondary to microangiopathy.  The ventricles and sulci are appropriate for the patient's age. The basal cisterns are patent.  Visualized portions of the orbits are unremarkable. The visualized portions of the paranasal sinuses and mastoid air cells are unremarkable.  The osseous structures are unremarkable.  IMPRESSION: No acute intracranial pathology.   Electronically Signed   By: Kathreen Devoid   On: 01/18/2013 11:51   Ct Abdomen Pelvis W Contrast  01/18/2013   CLINICAL DATA:  Abdominal pain.  Fever.  Altered mental status.  EXAM: CT ABDOMEN AND PELVIS WITH CONTRAST  TECHNIQUE: Multidetector CT imaging of the abdomen and pelvis was performed using the standard protocol following bolus administration of intravenous contrast.  CONTRAST:  119mL OMNIPAQUE IOHEXOL 300 MG/ML  SOLN  COMPARISON:  03/18/2012 CT.  FINDINGS: Minimal scarring/subsegmental atelectatic changes lung bases.  Narrowing of the mid to distal ascending colon. It is possible this is  related to under distension however, this may reflect stricture from prior inflammation as patient was noted to have colitis at this level previously. Currently, no extra luminal bowel inflammatory process, free fluid or free air is noted.  Mild thickening distal esophagus. This may be related to under distension or reflux. No well-defined mass identified.  Post cholecystectomy. No focal hepatic, splenic, pancreatic or renal lesion. Parapelvic left renal cyst noted. Nodularity adrenal gland unchanged more notable on the right possibly small adenomas.  Atherosclerotic type changes of the abdominal aorta with  and iliac arteries without aneurysmal dilation.  No adenopathy.  Non contrast filled views of the urinary bladder unremarkable.  Suggestion of mild prominence of the endometrium. This can be assessed with pelvic sonogram. Mild fullness of the left adnexae without discrete mass.  New from the prior examination is L3 superior endplate mild compression fracture with 10% lost height. This does not appear healed and may be acute/subacute. Minimal retropulsion on the left. Bulge. Spinal stenosis greater on the left. Multifactorial moderate L4-5 spinal stenosis is. Minimal anterior slip L4 secondary to facet degenerative changes.  Moderate right hip joint degenerative changes with subchondral cyst.  IMPRESSION: Narrowing of the mid to distal ascending colon. It is possible this is related to under distension however, this may reflect stricture from prior inflammation as patient was noted to have colitis at this level previously. Currently, no extra luminal bowel inflammatory process, free fluid or free air is noted.  Mild thickening distal esophagus. This may be related to under distension or reflux. No well-defined mass identified.  Nodularity adrenal gland unchanged more notable on the right possibly small adenomas.  Suggestion of mild prominence of the endometrium. This can be assessed with pelvic sonogram. Mild fullness of the left adnexae without discrete mass.  New from the prior examination is L3 superior endplate mild compression fracture with 10% lost height. This does not appear healed and may be acute/subacute. Minimal retropulsion on the left. Bulge. Spinal stenosis greater on the left.  Multifactorial moderate L4-5 spinal stenosis.  Moderate right hip joint degenerative changes with subchondral cysts.   Electronically Signed   By: Chauncey Cruel M.D.   On: 01/18/2013 12:51   Dg Lumbar Puncture Fluoro Guide  01/18/2013   CLINICAL DATA:  Fever, headache, altered mental status.  EXAM: DIAGNOSTIC LUMBAR PUNCTURE  UNDER FLUOROSCOPIC GUIDANCE  FLUOROSCOPY TIME:  0 min 23 seconds  PROCEDURE: Informed consent was obtained from the patient's daughter prior to the procedure, including potential complications of headache, allergy, and pain. With the patient prone, the lower back was prepped with Betadine. 1% Lidocaine was used for local anesthesia. Lumbar puncture was performed at the L3-4 level using a 3-1/2 inch 20 gauge needle with return of clear CSF. 14 ml of CSF were obtained for laboratory studies. The patient tolerated the procedure well and there were no apparent complications.  IMPRESSION: Successful fluoroscopic guided lumbar puncture.   Electronically Signed   By: Logan Bores   On: 01/18/2013 16:55    Scheduled Meds: . calcitonin (salmon)  1 spray Alternating Nares Daily  . oseltamivir  75 mg Oral Daily  . potassium chloride  40 mEq Oral Daily   Continuous Infusions: . sodium chloride 100 mL/hr at 01/19/13 0357   PRN Meds:acetaminophen  Assessment/Plan: ALTERED MENTAL STATUS: resolved. ? Just flu. LP fine INFLUENZA: continue Rx, Cr 0.78, OK for full dose HYPOKALEMIA: replace HYPOTHYROID: restart LUMBAR COMPRESSION FX: add miacalcin. See if we can ambulate   LOS: 1  day   Maks Cavallero Antony Haste 01/19/2013, 8:37 AM

## 2013-01-20 DIAGNOSIS — R4182 Altered mental status, unspecified: Secondary | ICD-10-CM | POA: Diagnosis not present

## 2013-01-20 DIAGNOSIS — S32009A Unspecified fracture of unspecified lumbar vertebra, initial encounter for closed fracture: Secondary | ICD-10-CM | POA: Diagnosis not present

## 2013-01-20 DIAGNOSIS — E039 Hypothyroidism, unspecified: Secondary | ICD-10-CM | POA: Diagnosis not present

## 2013-01-20 DIAGNOSIS — E785 Hyperlipidemia, unspecified: Secondary | ICD-10-CM | POA: Diagnosis not present

## 2013-01-20 DIAGNOSIS — R509 Fever, unspecified: Secondary | ICD-10-CM | POA: Diagnosis not present

## 2013-01-20 DIAGNOSIS — J111 Influenza due to unidentified influenza virus with other respiratory manifestations: Secondary | ICD-10-CM | POA: Diagnosis not present

## 2013-01-20 LAB — BASIC METABOLIC PANEL
BUN: 8 mg/dL (ref 6–23)
CO2: 25 mEq/L (ref 19–32)
Calcium: 8.2 mg/dL — ABNORMAL LOW (ref 8.4–10.5)
Chloride: 105 mEq/L (ref 96–112)
Creatinine, Ser: 0.73 mg/dL (ref 0.50–1.10)
GFR calc Af Amer: 88 mL/min — ABNORMAL LOW (ref >90)
GFR calc non Af Amer: 76 mL/min — ABNORMAL LOW (ref >90)
Glucose, Bld: 106 mg/dL — ABNORMAL HIGH (ref 70–99)
Potassium: 3.7 mEq/L (ref 3.7–5.3)
Sodium: 142 mEq/L (ref 137–147)

## 2013-01-20 MED ORDER — OSELTAMIVIR PHOSPHATE 75 MG PO CAPS: 75.0000 mg | ORAL_CAPSULE | Freq: Every day | ORAL | Status: DC

## 2013-01-20 NOTE — Discharge Instructions (Signed)
Use the miacalcin one spray a day for 3 weeks Use the tamiflu 75 twice a day for 4 more days Keep up on fluids OK to use motrin and/or tylenol

## 2013-01-20 NOTE — Progress Notes (Signed)
PHARMACIST - PHYSICIAN COMMUNICATION CONCERNING:  Oseltamivir 30 mg capsule shortage  DESCRIPTION:  [facility] is experiencing a significant shortage of Oseltamivir 30mg  capsules.    This patient has an order for Oseltamivir (Tamiflu) and has an Estimated Creatinine Clearance: 53.4 ml/min (by C-G formula based on Cr of 0.73).. The package insert for Oseltamivir recommends 30mg  BID x 5 days for patients with CrCl 30-60 ml/min.  To preserve an adequate supply of 30mg  capsules for our patients with more significant renal impairment the Infectious Disease team has recommended to substitute Oseltamivir 75mg  qday x 5 days for patients with CrCl 30-60 ml/min at this time.   RECOMMENDATION: Oseltamivir 75mg  PO DAILY x 5 days has been substituted for your patient. If you have any questions about this temporary substitution please feel free to call the Pharmacy at [phone] for assistance.  Vanessa Gonzales, PharmD, BCPS Pager: [phone] 7:15 AM Pharmacy #: [phone]

## 2013-01-20 NOTE — Discharge Summary (Signed)
Traci Richardson  MR#: [id number]  DOB:[birth year]  Date of Admission: 01/18/2013 Date of Discharge: 01/20/2013  Attending Physician:Hubbard Seldon ALAN  Patient's PCP:No primary provider on file.  Consults: ID/IR Procedures: CT of the head CT of the abdomen and lumbar puncture  Discharge Diagnoses: INFLUENZA WITH ALTERED MENTAL STATUS Active Problems:   Hypothyroid   Altered mental status   Fever hypokalemia   Lumbar compression fracture   Fall   Influenza due to identified novel influenza A virus with other respiratory manifestations Mild anemia   Discharge Medications:   Medication List         ezetimibe-simvastatin 10-40 MG per tablet  Commonly known as:  VYTORIN  Take 1 tablet by mouth at bedtime.     levothyroxine 88 MCG tablet  Commonly known as:  SYNTHROID, LEVOTHROID  Take 88 mcg by mouth every morning.     multivitamin with minerals Tabs tablet  Take 1 tablet by mouth every morning.     oseltamivir 75 MG capsule  Commonly known as:  TAMIFLU  Take 1 capsule (75 mg total) by mouth daily.      will continue Mialcalcin nasal spray  Hospital Procedures: Dg Chest 2 View  01/18/2013   CLINICAL DATA:  Cough, fever  EXAM: CHEST  2 VIEW  COMPARISON:  DG CHEST 2 VIEW dated 04/29/2011  FINDINGS: The heart size and mediastinal contours are within normal limits. Both lungs are clear. The visualized skeletal structures are unremarkable.  IMPRESSION: No active cardiopulmonary disease.   Electronically Signed   By: Kathreen Devoid   On: 01/18/2013 11:30   Ct Head Wo Contrast  01/18/2013   CLINICAL DATA:  fall, headaches, confusion  EXAM: CT HEAD WITHOUT CONTRAST  TECHNIQUE: Contiguous axial images were obtained from the base of the skull through the vertex without intravenous contrast.  COMPARISON:  CT HEAD W/O CM dated 04/29/2011  FINDINGS: There is no evidence of mass effect, midline shift, or extra-axial fluid collections. There is no evidence of a  space-occupying lesion or intracranial hemorrhage. There is no evidence of a cortical-based area of acute infarction. There is generalized cerebral atrophy. There is periventricular white matter low attenuation likely secondary to microangiopathy.  The ventricles and sulci are appropriate for the patient's age. The basal cisterns are patent.  Visualized portions of the orbits are unremarkable. The visualized portions of the paranasal sinuses and mastoid air cells are unremarkable.  The osseous structures are unremarkable.  IMPRESSION: No acute intracranial pathology.   Electronically Signed   By: Kathreen Devoid   On: 01/18/2013 11:51   Ct Abdomen Pelvis W Contrast  01/18/2013   CLINICAL DATA:  Abdominal pain.  Fever.  Altered mental status.  EXAM: CT ABDOMEN AND PELVIS WITH CONTRAST  TECHNIQUE: Multidetector CT imaging of the abdomen and pelvis was performed using the standard protocol following bolus administration of intravenous contrast.  CONTRAST:  190mL OMNIPAQUE IOHEXOL 300 MG/ML  SOLN  COMPARISON:  03/18/2012 CT.  FINDINGS: Minimal scarring/subsegmental atelectatic changes lung bases.  Narrowing of the mid to distal ascending colon. It is possible this is related to under distension however, this may reflect stricture from prior inflammation as patient was noted to have colitis at this level previously. Currently, no extra luminal bowel inflammatory process, free fluid or free air is noted.  Mild thickening distal esophagus. This may be related to under distension or reflux. No well-defined mass identified.  Post cholecystectomy. No focal hepatic, splenic, pancreatic or renal lesion. Parapelvic left  renal cyst noted. Nodularity adrenal gland unchanged more notable on the right possibly small adenomas.  Atherosclerotic type changes of the abdominal aorta with and iliac arteries without aneurysmal dilation.  No adenopathy.  Non contrast filled views of the urinary bladder unremarkable.  Suggestion of mild  prominence of the endometrium. This can be assessed with pelvic sonogram. Mild fullness of the left adnexae without discrete mass.  New from the prior examination is L3 superior endplate mild compression fracture with 10% lost height. This does not appear healed and may be acute/subacute. Minimal retropulsion on the left. Bulge. Spinal stenosis greater on the left. Multifactorial moderate L4-5 spinal stenosis is. Minimal anterior slip L4 secondary to facet degenerative changes.  Moderate right hip joint degenerative changes with subchondral cyst.  IMPRESSION: Narrowing of the mid to distal ascending colon. It is possible this is related to under distension however, this may reflect stricture from prior inflammation as patient was noted to have colitis at this level previously. Currently, no extra luminal bowel inflammatory process, free fluid or free air is noted.  Mild thickening distal esophagus. This may be related to under distension or reflux. No well-defined mass identified.  Nodularity adrenal gland unchanged more notable on the right possibly small adenomas.  Suggestion of mild prominence of the endometrium. This can be assessed with pelvic sonogram. Mild fullness of the left adnexae without discrete mass.  New from the prior examination is L3 superior endplate mild compression fracture with 10% lost height. This does not appear healed and may be acute/subacute. Minimal retropulsion on the left. Bulge. Spinal stenosis greater on the left.  Multifactorial moderate L4-5 spinal stenosis.  Moderate right hip joint degenerative changes with subchondral cysts.   Electronically Signed   By: Chauncey Cruel M.D.   On: 01/18/2013 12:51   Dg Lumbar Puncture Fluoro Guide  01/18/2013   CLINICAL DATA:  Fever, headache, altered mental status.  EXAM: DIAGNOSTIC LUMBAR PUNCTURE UNDER FLUOROSCOPIC GUIDANCE  FLUOROSCOPY TIME:  0 min 23 seconds  PROCEDURE: Informed consent was obtained from the patient's daughter prior to the  procedure, including potential complications of headache, allergy, and pain. With the patient prone, the lower back was prepped with Betadine. 1% Lidocaine was used for local anesthesia. Lumbar puncture was performed at the L3-4 level using a 3-1/2 inch 20 gauge needle with return of clear CSF. 14 ml of CSF were obtained for laboratory studies. The patient tolerated the procedure well and there were no apparent complications.  IMPRESSION: Successful fluoroscopic guided lumbar puncture.   Electronically Signed   By: Logan Bores   On: 01/18/2013 16:55    History of Present Illness: Fever and altered status  Hospital Course: This is a healthy 80 year old white female presented with fever and altered mental status. She had extensive workup in the emergency room. Urine white blood cell count CT the abdomen and chest x-ray are negative. Today altered mental status she had a lumbar puncture which was also fine. In the interim she returned with a positive influenza A swab. The patient's been treated with fluids and Tamiflu and has made remarkable progress. Her mental status is returned to normal. She does have a relatively new lumbar compression fracture with some pain. She has however been able to mobilize despite this. She's now started some Miacalcin to help this. The patient now eating and drinking well. She has no GI upset. Her breathing is doing well with some congestion. Her fever curve is improved. Her mental status is 100% back  to normal. She has a mild hypokalemia which is resolved. She has no cardiac symptoms or neurologic symptoms   at the present moment.  Day of Discharge Exam BP 161/80  Pulse 63  Temp(Src) 98.3 F (36.8 C) (Oral)  Resp 18  Ht 5\' 3"  (1.6 m)  Wt 85.9 kg (189 lb 6 oz)  BMI 33.55 kg/m2  SpO2 99%  Physical Exam: General appearance: Nontoxic sitting up in chair in no distress sclera anicteric. No nystagmus is present oral mucous members are moist.  Resp: Clear with occasional  adventitious sounds. No accessory muscles in use. Cardio: Regular with systolic murmur GI: soft, non-tender; bowel sounds normal; no masses,  no organomegaly Extremities: no clubbing, cyanosis or edema Neuro: Patient's awake alert and mentating perfectly well speech is clear and fluent. Memory is intact. No resting tremor is present. No hallucination or delusions are present. Grip is equal bilaterally. There gait is intact  Discharge Labs:  Recent Labs  01/19/13 0550 01/20/13 0515  NA 139 142  K 3.1* 3.7  CL 103 105  CO2 24 25  GLUCOSE 114* 106*  BUN 11 8  CREATININE 0.78 0.73  CALCIUM 7.7* 8.2*    Recent Labs  01/18/13 1046 01/19/13 0550  AST 24 19  ALT 13 10  ALKPHOS 96 68  BILITOT 0.6 0.4  PROT 8.0 5.9*  ALBUMIN 4.3 3.1*    Recent Labs  01/18/13 1046 01/18/13 1116 01/19/13 0550  WBC 8.2  --  5.1  HGB 13.9 15.0 11.2*  HCT 40.7 44.0 33.7*  MCV 92.1  --  92.8  PLT 152  --  120*   Results for RAYSHA, PROVENCHER (MRN [id number]) as of 01/20/2013 10:50  Ref. Range 01/18/2013 16:45  RBC Count, CSF Latest Range: 0 /cu mm 1 (H)  WBC, CSF Latest Range: 0-5 /cu mm 4  Other Cells, CSF No range found TOO FEW TO COUNT, SMEAR AVAILABLE FOR REVIEW  Appearance, CSF Latest Range: CLEAR  CLEAR  Color, CSF Latest Range: COLORLESS  COLORLESS  Supernatant No range found NOT INDICATED  Tube # No range found 1     Recent Labs  01/19/13 0550  TSH 2.249     Discharge instructions: Continue Tamiflu twice a day for 4 days. He can use Tylenol or Motrin for fever. Call if he get worse. He is Miacalcin nasal spray one spray daily for 3 weeks   Disposition: To home  Follow-up Appts: Follow-up with Dr. Forde Dandy at [facility] in 2 weeks.  Call for appointment.  Condition on Discharge: Improved  Tests Needing Follow-up: Cultures on CSF  Signed: Dezaree Tracey ALAN 01/20/2013, 10:44 AM

## 2013-01-21 NOTE — ED Provider Notes (Signed)
Medical screening examination/treatment/procedure(s) were conducted as a shared visit with non-physician practitioner(s) and myself.  I personally evaluated the patient during the encounter.  EKG Interpretation    Date/Time:  Tuesday January 18 2013 11:27:57 EST Ventricular Rate:  85 PR Interval:  156 QRS Duration: 101 QT Interval:  405 QTC Calculation: 482 R Axis:   31 Text Interpretation:  Sinus rhythm RSR' in V1 or V2, right VCD or RVH Inferior infarct, age indeterminate T wave flattening in inferior and lateral leads changed from previous Confirmed by YAO  MD, DAVID [phone]) on 01/18/2013 11:38:15 AM            Traci Richardson is a 80 y.o. female hx of hypothyroidism here with AMS. Altered since yesterday, less responsive than usual. Also fell next to the couch. As per family, her abdomen is more distended. Low grade temp 100.1 in the ED. Mild diffuse abdominal distention and tenderness. CT head and CT ab/pel unremarkable. UA and CXR unremarkable. We called Dr. Forde Dandy to admit. He recommended LP. Patient has L3 fracture so IR called to perform LP. Patient admitted for AMS, fever.    Wandra Arthurs, MD 01/21/13 [phone]

## 2013-01-22 LAB — CSF CULTURE: Gram Stain: NONE SEEN

## 2013-01-22 LAB — CSF CULTURE W GRAM STAIN: Culture: NO GROWTH

## 2013-01-24 LAB — CULTURE, BLOOD (ROUTINE X 2)
Culture: NO GROWTH
Culture: NO GROWTH

## 2013-02-02 DIAGNOSIS — Z6835 Body mass index (BMI) 35.0-35.9, adult: Secondary | ICD-10-CM | POA: Diagnosis not present

## 2013-02-02 DIAGNOSIS — E785 Hyperlipidemia, unspecified: Secondary | ICD-10-CM | POA: Diagnosis not present

## 2013-02-02 DIAGNOSIS — S32009A Unspecified fracture of unspecified lumbar vertebra, initial encounter for closed fracture: Secondary | ICD-10-CM | POA: Diagnosis not present

## 2013-02-02 DIAGNOSIS — J111 Influenza due to unidentified influenza virus with other respiratory manifestations: Secondary | ICD-10-CM | POA: Diagnosis not present

## 2013-02-02 DIAGNOSIS — E559 Vitamin D deficiency, unspecified: Secondary | ICD-10-CM | POA: Diagnosis not present

## 2013-02-02 DIAGNOSIS — E042 Nontoxic multinodular goiter: Secondary | ICD-10-CM | POA: Diagnosis not present

## 2013-03-08 DIAGNOSIS — N390 Urinary tract infection, site not specified: Secondary | ICD-10-CM | POA: Diagnosis not present

## 2013-03-08 DIAGNOSIS — R300 Dysuria: Secondary | ICD-10-CM | POA: Diagnosis not present

## 2013-03-08 DIAGNOSIS — Z6835 Body mass index (BMI) 35.0-35.9, adult: Secondary | ICD-10-CM | POA: Diagnosis not present

## 2013-05-13 DIAGNOSIS — R82998 Other abnormal findings in urine: Secondary | ICD-10-CM | POA: Diagnosis not present

## 2013-05-13 DIAGNOSIS — R300 Dysuria: Secondary | ICD-10-CM | POA: Diagnosis not present

## 2013-06-08 DIAGNOSIS — S32009A Unspecified fracture of unspecified lumbar vertebra, initial encounter for closed fracture: Secondary | ICD-10-CM | POA: Diagnosis not present

## 2013-06-08 DIAGNOSIS — E559 Vitamin D deficiency, unspecified: Secondary | ICD-10-CM | POA: Diagnosis not present

## 2013-06-08 DIAGNOSIS — Z6835 Body mass index (BMI) 35.0-35.9, adult: Secondary | ICD-10-CM | POA: Diagnosis not present

## 2013-06-08 DIAGNOSIS — E039 Hypothyroidism, unspecified: Secondary | ICD-10-CM | POA: Diagnosis not present

## 2013-06-08 DIAGNOSIS — Z1331 Encounter for screening for depression: Secondary | ICD-10-CM | POA: Diagnosis not present

## 2013-06-08 DIAGNOSIS — E785 Hyperlipidemia, unspecified: Secondary | ICD-10-CM | POA: Diagnosis not present

## 2013-06-08 DIAGNOSIS — E042 Nontoxic multinodular goiter: Secondary | ICD-10-CM | POA: Diagnosis not present

## 2013-06-17 DIAGNOSIS — E042 Nontoxic multinodular goiter: Secondary | ICD-10-CM | POA: Diagnosis not present

## 2013-10-28 DIAGNOSIS — H02834 Dermatochalasis of left upper eyelid: Secondary | ICD-10-CM | POA: Diagnosis not present

## 2013-10-28 DIAGNOSIS — H04123 Dry eye syndrome of bilateral lacrimal glands: Secondary | ICD-10-CM | POA: Diagnosis not present

## 2013-10-28 DIAGNOSIS — H02831 Dermatochalasis of right upper eyelid: Secondary | ICD-10-CM | POA: Diagnosis not present

## 2013-10-28 DIAGNOSIS — Z961 Presence of intraocular lens: Secondary | ICD-10-CM | POA: Diagnosis not present

## 2013-11-21 DIAGNOSIS — Z23 Encounter for immunization: Secondary | ICD-10-CM | POA: Diagnosis not present

## 2013-12-19 DIAGNOSIS — R8299 Other abnormal findings in urine: Secondary | ICD-10-CM | POA: Diagnosis not present

## 2013-12-19 DIAGNOSIS — R300 Dysuria: Secondary | ICD-10-CM | POA: Diagnosis not present

## 2013-12-27 DIAGNOSIS — E559 Vitamin D deficiency, unspecified: Secondary | ICD-10-CM | POA: Diagnosis not present

## 2013-12-27 DIAGNOSIS — Z6837 Body mass index (BMI) 37.0-37.9, adult: Secondary | ICD-10-CM | POA: Diagnosis not present

## 2013-12-27 DIAGNOSIS — M859 Disorder of bone density and structure, unspecified: Secondary | ICD-10-CM | POA: Diagnosis not present

## 2013-12-27 DIAGNOSIS — S32009A Unspecified fracture of unspecified lumbar vertebra, initial encounter for closed fracture: Secondary | ICD-10-CM | POA: Diagnosis not present

## 2013-12-27 DIAGNOSIS — E042 Nontoxic multinodular goiter: Secondary | ICD-10-CM | POA: Diagnosis not present

## 2013-12-27 DIAGNOSIS — E039 Hypothyroidism, unspecified: Secondary | ICD-10-CM | POA: Diagnosis not present

## 2013-12-27 DIAGNOSIS — R8299 Other abnormal findings in urine: Secondary | ICD-10-CM | POA: Diagnosis not present

## 2013-12-27 DIAGNOSIS — N390 Urinary tract infection, site not specified: Secondary | ICD-10-CM | POA: Diagnosis not present

## 2014-06-28 DIAGNOSIS — E039 Hypothyroidism, unspecified: Secondary | ICD-10-CM | POA: Diagnosis not present

## 2014-06-28 DIAGNOSIS — Z1389 Encounter for screening for other disorder: Secondary | ICD-10-CM | POA: Diagnosis not present

## 2014-06-28 DIAGNOSIS — E559 Vitamin D deficiency, unspecified: Secondary | ICD-10-CM | POA: Diagnosis not present

## 2014-06-28 DIAGNOSIS — M810 Age-related osteoporosis without current pathological fracture: Secondary | ICD-10-CM | POA: Diagnosis not present

## 2014-06-28 DIAGNOSIS — E042 Nontoxic multinodular goiter: Secondary | ICD-10-CM | POA: Diagnosis not present

## 2014-06-28 DIAGNOSIS — Z6835 Body mass index (BMI) 35.0-35.9, adult: Secondary | ICD-10-CM | POA: Diagnosis not present

## 2014-06-28 DIAGNOSIS — E785 Hyperlipidemia, unspecified: Secondary | ICD-10-CM | POA: Diagnosis not present

## 2014-10-20 DIAGNOSIS — Z23 Encounter for immunization: Secondary | ICD-10-CM | POA: Diagnosis not present

## 2014-12-13 DIAGNOSIS — E784 Other hyperlipidemia: Secondary | ICD-10-CM | POA: Diagnosis not present

## 2014-12-13 DIAGNOSIS — Z6835 Body mass index (BMI) 35.0-35.9, adult: Secondary | ICD-10-CM | POA: Diagnosis not present

## 2014-12-13 DIAGNOSIS — E042 Nontoxic multinodular goiter: Secondary | ICD-10-CM | POA: Diagnosis not present

## 2014-12-13 DIAGNOSIS — E559 Vitamin D deficiency, unspecified: Secondary | ICD-10-CM | POA: Diagnosis not present

## 2014-12-13 DIAGNOSIS — E038 Other specified hypothyroidism: Secondary | ICD-10-CM | POA: Diagnosis not present

## 2014-12-13 DIAGNOSIS — M810 Age-related osteoporosis without current pathological fracture: Secondary | ICD-10-CM | POA: Diagnosis not present

## 2014-12-13 IMAGING — CT CT ABD-PELV W/ CM
1 of 3 series · 13 of 32 positions shown, 18 images · IV contrast (OMNIPAQUE 300)
Comparison: 03/18/2012 CT.

CLINICAL DATA: Abdominal pain.  Fever.  Altered mental status.

EXAM:
CT ABDOMEN AND PELVIS WITH CONTRAST
TECHNIQUE: Multidetector CT imaging of the abdomen and pelvis was performed
using the standard protocol following bolus administration of
intravenous contrast.
CONTRAST:  100mL OMNIPAQUE IOHEXOL 300 MG/ML  SOLN

[Series 2: abd/pel with · axial · 0.79mm/px · z∈[-765,-400]mm · 13 of 83 slices shown, 18 images]
[im 5/83  soft-tissue]
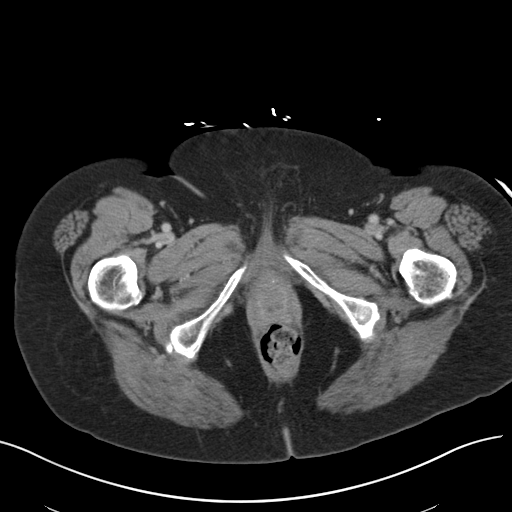
[im 5/83  bone]
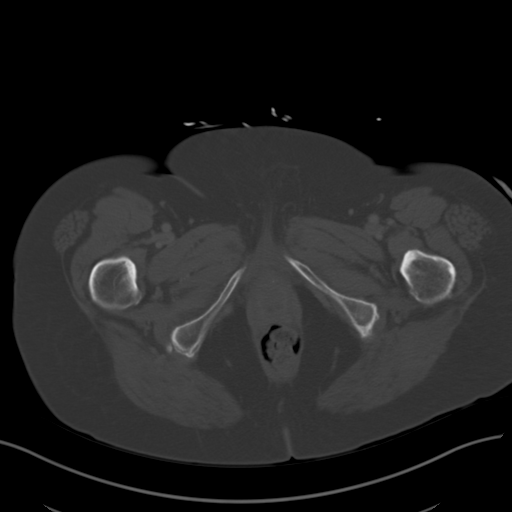
[im 13/83  soft-tissue]
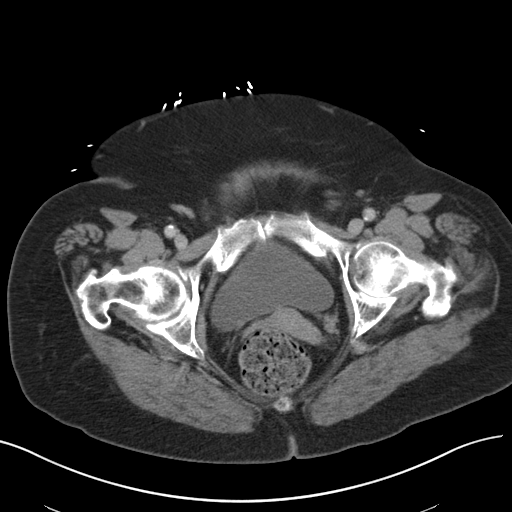
[im 17/83  soft-tissue]
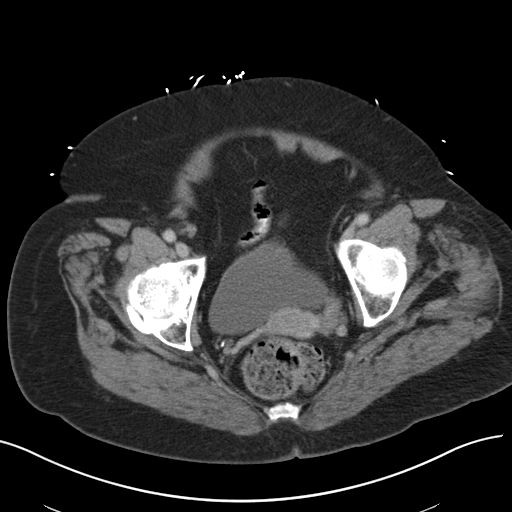
[im 25/83  soft-tissue]
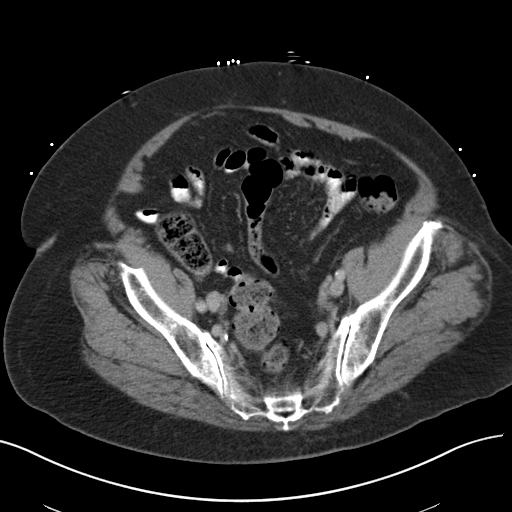
[im 33/83  soft-tissue]
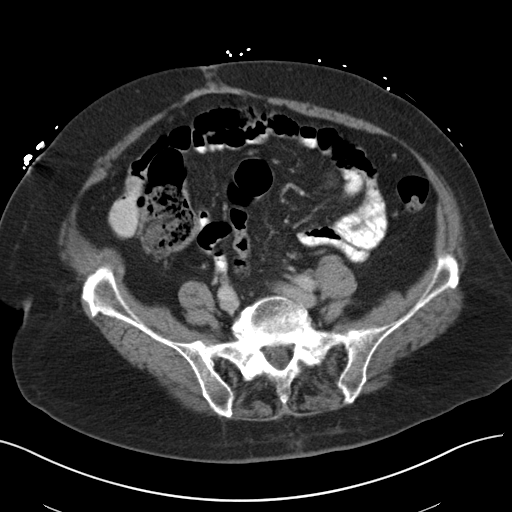
[im 37/83  soft-tissue]
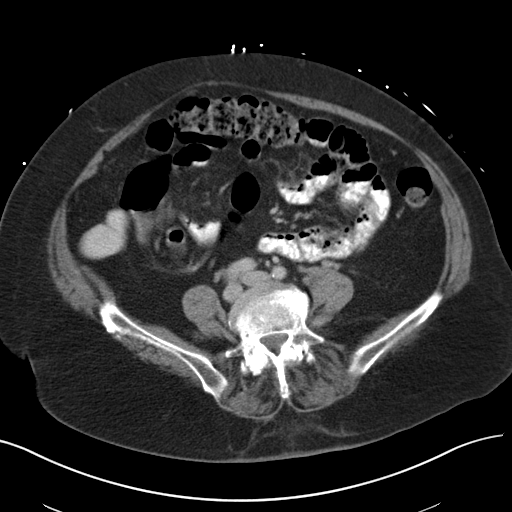
[im 46/83  soft-tissue]
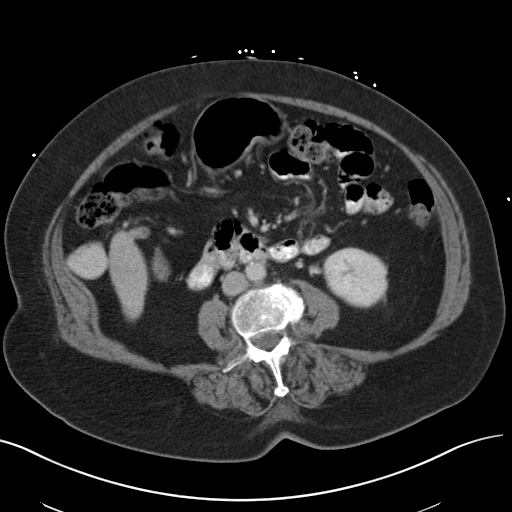
[im 50/83  soft-tissue]
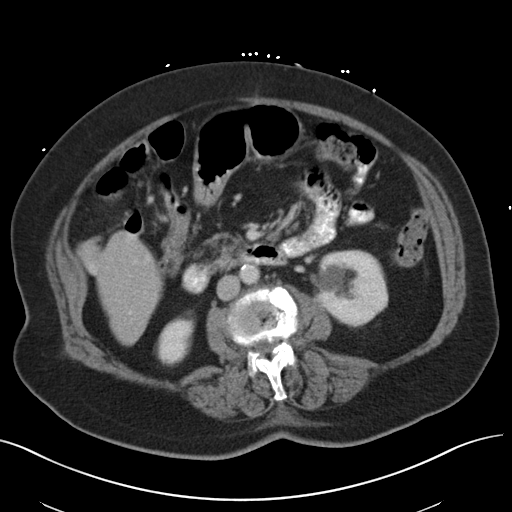
[im 58/83  soft-tissue]
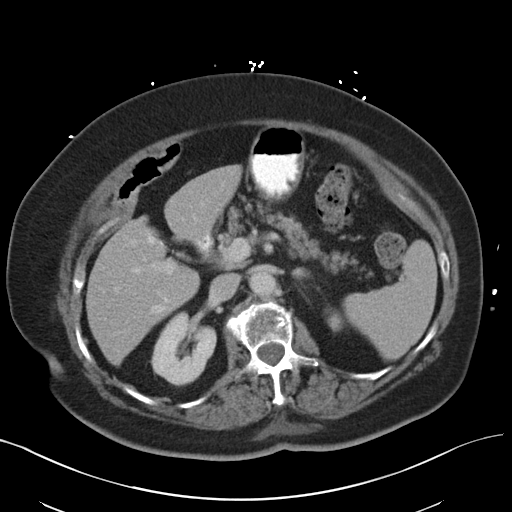
[im 58/83  bone]
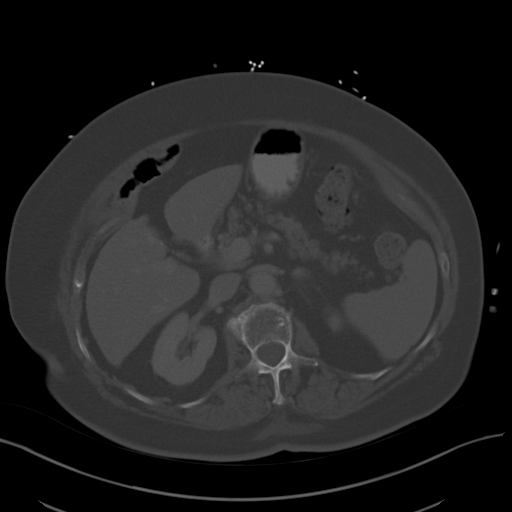
[im 66/83  soft-tissue]
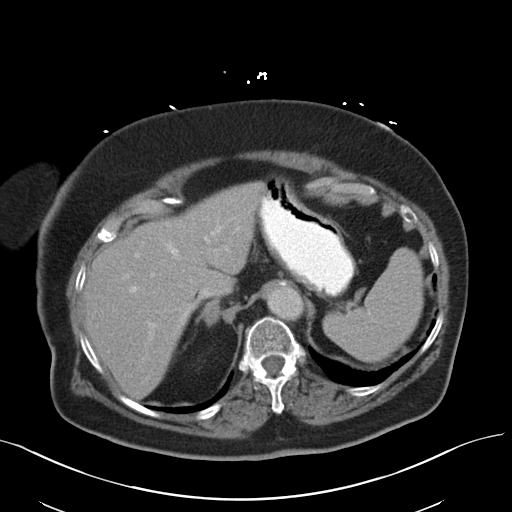
[im 66/83  lung]
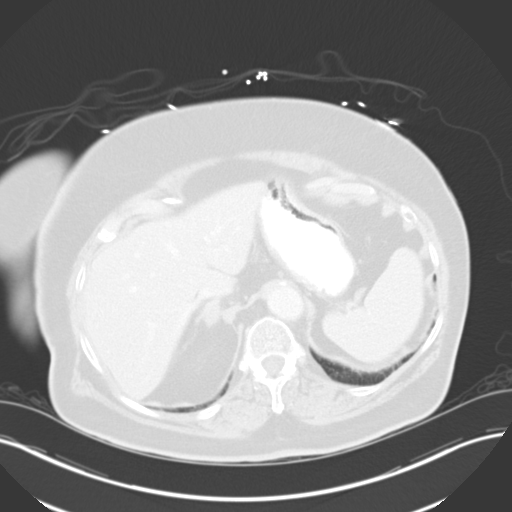
[im 70/83  soft-tissue]
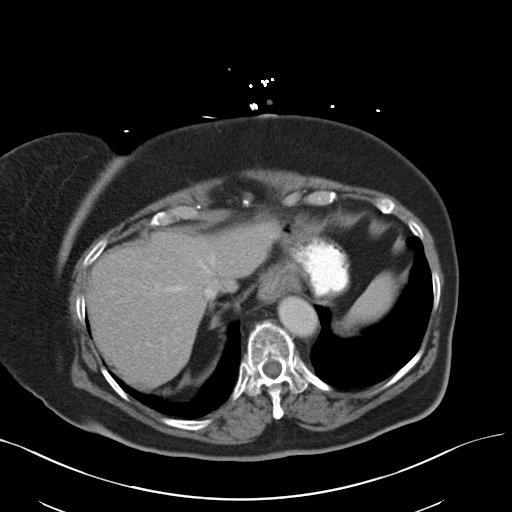
[im 70/83  lung]
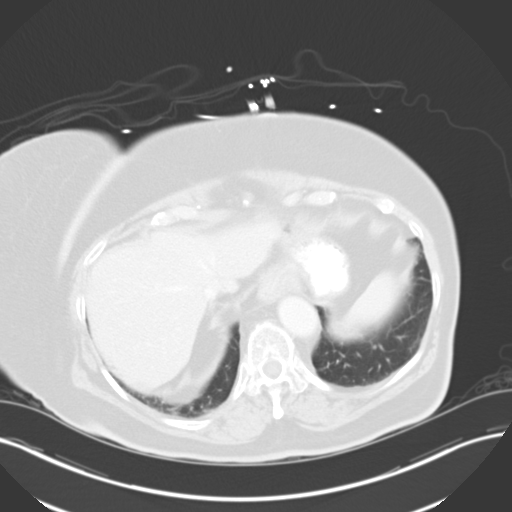
[im 74/83  lung]
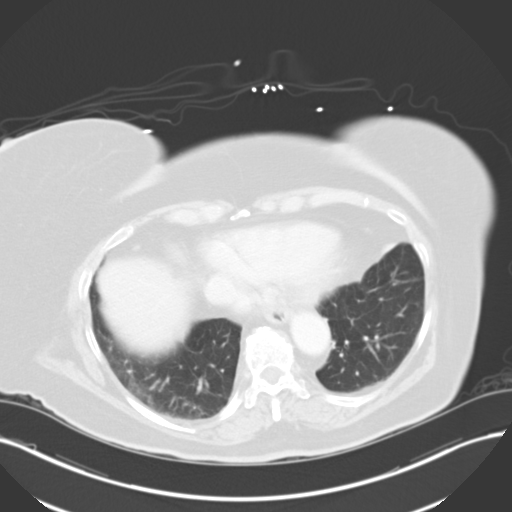
[im 78/83  soft-tissue]
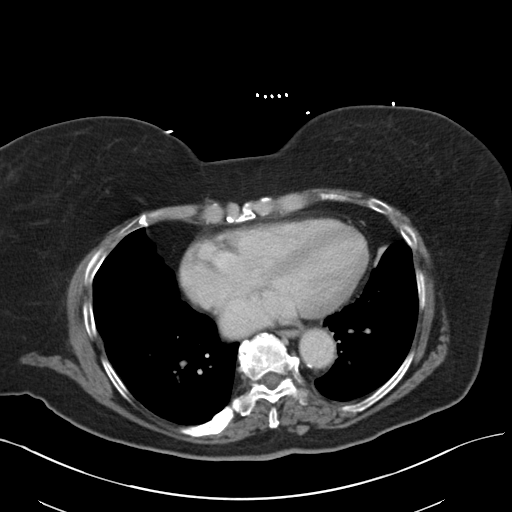
[im 78/83  lung]
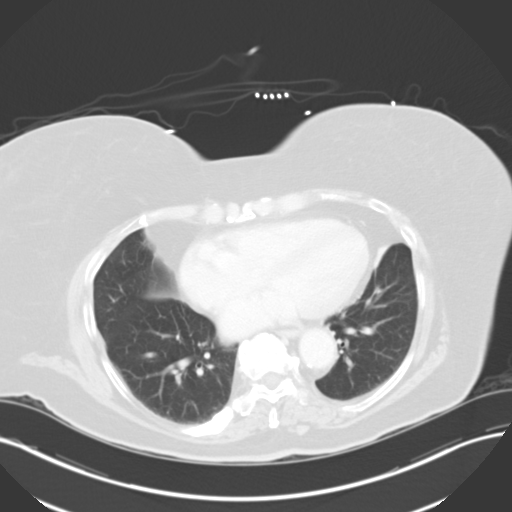

[13 of 32 positions shown; findings below may reference images not displayed]

FINDINGS: Minimal scarring/subsegmental atelectatic changes lung bases.

Narrowing of the mid to distal ascending colon. It is possible this
is related to under distension however, this may reflect stricture
from prior inflammation as patient was noted to have colitis at this
level previously. Currently, no extra luminal bowel inflammatory
process, free fluid or free air is noted.

Mild thickening distal esophagus. This may be related to under
distension or reflux. No well-defined mass identified.

Post cholecystectomy. No focal hepatic, splenic, pancreatic or renal
lesion. Parapelvic left renal cyst noted. Nodularity adrenal gland
unchanged more notable on the right possibly small adenomas.

Atherosclerotic type changes of the abdominal aorta with and iliac
arteries without aneurysmal dilation.

No adenopathy.

Non contrast filled views of the urinary bladder unremarkable.

Suggestion of mild prominence of the endometrium. This can be
assessed with pelvic sonogram. Mild fullness of the left adnexae
without discrete mass.

New from the prior examination is L3 superior endplate mild
compression fracture with 10% lost height. This does not appear
healed and may be acute/subacute. Minimal retropulsion on the left.
Bulge. Spinal stenosis greater on the left. Multifactorial moderate
L4-5 spinal stenosis is. Minimal anterior slip L4 secondary to facet
degenerative changes.

Moderate right hip joint degenerative changes with subchondral cyst.
IMPRESSION: Narrowing of the mid to distal ascending colon. It is possible this
is related to under distension however, this may reflect stricture
from prior inflammation as patient was noted to have colitis at this
level previously. Currently, no extra luminal bowel inflammatory
process, free fluid or free air is noted.

Mild thickening distal esophagus. This may be related to under
distension or reflux. No well-defined mass identified.

Nodularity adrenal gland unchanged more notable on the right
possibly small adenomas.

Suggestion of mild prominence of the endometrium. This can be
assessed with pelvic sonogram. Mild fullness of the left adnexae
without discrete mass.

New from the prior examination is L3 superior endplate mild
compression fracture with 10% lost height. This does not appear
healed and may be acute/subacute. Minimal retropulsion on the left.
Bulge. Spinal stenosis greater on the left.

Multifactorial moderate L4-5 spinal stenosis.

Moderate right hip joint degenerative changes with subchondral
cysts.

## 2015-02-08 DIAGNOSIS — S0990XA Unspecified injury of head, initial encounter: Secondary | ICD-10-CM | POA: Diagnosis not present

## 2015-02-08 DIAGNOSIS — M5033 Other cervical disc degeneration, cervicothoracic region: Secondary | ICD-10-CM | POA: Diagnosis not present

## 2015-02-08 DIAGNOSIS — W07XXXA Fall from chair, initial encounter: Secondary | ICD-10-CM | POA: Diagnosis not present

## 2015-02-08 DIAGNOSIS — M549 Dorsalgia, unspecified: Secondary | ICD-10-CM | POA: Diagnosis not present

## 2015-02-08 DIAGNOSIS — S161XXA Strain of muscle, fascia and tendon at neck level, initial encounter: Secondary | ICD-10-CM | POA: Diagnosis not present

## 2015-02-08 DIAGNOSIS — S12200A Unspecified displaced fracture of third cervical vertebra, initial encounter for closed fracture: Secondary | ICD-10-CM | POA: Diagnosis not present

## 2015-02-08 DIAGNOSIS — S12500A Unspecified displaced fracture of sixth cervical vertebra, initial encounter for closed fracture: Secondary | ICD-10-CM | POA: Diagnosis not present

## 2015-02-08 DIAGNOSIS — S134XXA Sprain of ligaments of cervical spine, initial encounter: Secondary | ICD-10-CM | POA: Diagnosis not present

## 2015-02-08 DIAGNOSIS — S12600A Unspecified displaced fracture of seventh cervical vertebra, initial encounter for closed fracture: Secondary | ICD-10-CM | POA: Diagnosis not present

## 2015-05-02 DIAGNOSIS — N390 Urinary tract infection, site not specified: Secondary | ICD-10-CM | POA: Diagnosis not present

## 2015-05-02 DIAGNOSIS — Z0000 Encounter for general adult medical examination without abnormal findings: Secondary | ICD-10-CM | POA: Diagnosis not present

## 2015-05-02 DIAGNOSIS — N952 Postmenopausal atrophic vaginitis: Secondary | ICD-10-CM | POA: Diagnosis not present

## 2015-06-18 DIAGNOSIS — E038 Other specified hypothyroidism: Secondary | ICD-10-CM | POA: Diagnosis not present

## 2015-06-18 DIAGNOSIS — E042 Nontoxic multinodular goiter: Secondary | ICD-10-CM | POA: Diagnosis not present

## 2015-06-18 DIAGNOSIS — E559 Vitamin D deficiency, unspecified: Secondary | ICD-10-CM | POA: Diagnosis not present

## 2015-06-18 DIAGNOSIS — Z6835 Body mass index (BMI) 35.0-35.9, adult: Secondary | ICD-10-CM | POA: Diagnosis not present

## 2015-06-18 DIAGNOSIS — Z1389 Encounter for screening for other disorder: Secondary | ICD-10-CM | POA: Diagnosis not present

## 2015-06-18 DIAGNOSIS — E784 Other hyperlipidemia: Secondary | ICD-10-CM | POA: Diagnosis not present

## 2015-06-21 DIAGNOSIS — N952 Postmenopausal atrophic vaginitis: Secondary | ICD-10-CM | POA: Diagnosis not present

## 2015-08-21 DIAGNOSIS — R8271 Bacteriuria: Secondary | ICD-10-CM | POA: Diagnosis not present

## 2015-10-05 DIAGNOSIS — Z6835 Body mass index (BMI) 35.0-35.9, adult: Secondary | ICD-10-CM | POA: Diagnosis not present

## 2015-10-05 DIAGNOSIS — R42 Dizziness and giddiness: Secondary | ICD-10-CM | POA: Diagnosis not present

## 2015-10-05 DIAGNOSIS — Z9181 History of falling: Secondary | ICD-10-CM | POA: Diagnosis not present

## 2015-10-05 DIAGNOSIS — R030 Elevated blood-pressure reading, without diagnosis of hypertension: Secondary | ICD-10-CM | POA: Diagnosis not present

## 2015-10-05 DIAGNOSIS — M25511 Pain in right shoulder: Secondary | ICD-10-CM | POA: Diagnosis not present

## 2015-10-16 DIAGNOSIS — N390 Urinary tract infection, site not specified: Secondary | ICD-10-CM | POA: Diagnosis not present

## 2015-11-08 DIAGNOSIS — R310 Gross hematuria: Secondary | ICD-10-CM | POA: Diagnosis not present

## 2015-11-09 DIAGNOSIS — R310 Gross hematuria: Secondary | ICD-10-CM | POA: Diagnosis not present

## 2015-11-15 DIAGNOSIS — R310 Gross hematuria: Secondary | ICD-10-CM | POA: Diagnosis not present

## 2015-11-15 DIAGNOSIS — N952 Postmenopausal atrophic vaginitis: Secondary | ICD-10-CM | POA: Diagnosis not present

## 2015-11-15 DIAGNOSIS — N3021 Other chronic cystitis with hematuria: Secondary | ICD-10-CM | POA: Diagnosis not present

## 2015-12-19 DIAGNOSIS — E039 Hypothyroidism, unspecified: Secondary | ICD-10-CM | POA: Diagnosis not present

## 2015-12-19 DIAGNOSIS — R319 Hematuria, unspecified: Secondary | ICD-10-CM | POA: Diagnosis not present

## 2015-12-19 DIAGNOSIS — M810 Age-related osteoporosis without current pathological fracture: Secondary | ICD-10-CM | POA: Diagnosis not present

## 2015-12-19 DIAGNOSIS — E559 Vitamin D deficiency, unspecified: Secondary | ICD-10-CM | POA: Diagnosis not present

## 2015-12-19 DIAGNOSIS — Z6836 Body mass index (BMI) 36.0-36.9, adult: Secondary | ICD-10-CM | POA: Diagnosis not present

## 2015-12-19 DIAGNOSIS — Z23 Encounter for immunization: Secondary | ICD-10-CM | POA: Diagnosis not present

## 2015-12-19 DIAGNOSIS — R300 Dysuria: Secondary | ICD-10-CM | POA: Diagnosis not present

## 2015-12-19 DIAGNOSIS — N390 Urinary tract infection, site not specified: Secondary | ICD-10-CM | POA: Diagnosis not present

## 2015-12-19 DIAGNOSIS — E784 Other hyperlipidemia: Secondary | ICD-10-CM | POA: Diagnosis not present

## 2016-01-11 DIAGNOSIS — R42 Dizziness and giddiness: Secondary | ICD-10-CM | POA: Diagnosis not present

## 2016-01-11 DIAGNOSIS — N390 Urinary tract infection, site not specified: Secondary | ICD-10-CM | POA: Diagnosis not present

## 2016-02-26 DIAGNOSIS — M25562 Pain in left knee: Secondary | ICD-10-CM | POA: Diagnosis not present

## 2016-02-26 DIAGNOSIS — M25552 Pain in left hip: Secondary | ICD-10-CM | POA: Diagnosis not present

## 2016-02-26 DIAGNOSIS — G8929 Other chronic pain: Secondary | ICD-10-CM | POA: Diagnosis not present

## 2016-03-04 DIAGNOSIS — H02834 Dermatochalasis of left upper eyelid: Secondary | ICD-10-CM | POA: Diagnosis not present

## 2016-03-04 DIAGNOSIS — H02831 Dermatochalasis of right upper eyelid: Secondary | ICD-10-CM | POA: Diagnosis not present

## 2016-03-04 DIAGNOSIS — Z961 Presence of intraocular lens: Secondary | ICD-10-CM | POA: Diagnosis not present

## 2016-03-04 DIAGNOSIS — H04123 Dry eye syndrome of bilateral lacrimal glands: Secondary | ICD-10-CM | POA: Diagnosis not present

## 2016-03-31 DIAGNOSIS — N3020 Other chronic cystitis without hematuria: Secondary | ICD-10-CM | POA: Diagnosis not present

## 2016-03-31 DIAGNOSIS — N952 Postmenopausal atrophic vaginitis: Secondary | ICD-10-CM | POA: Diagnosis not present

## 2016-04-16 ENCOUNTER — Ambulatory Visit (INDEPENDENT_AMBULATORY_CARE_PROVIDER_SITE_OTHER): Payer: Medicare Other

## 2016-04-16 ENCOUNTER — Ambulatory Visit (INDEPENDENT_AMBULATORY_CARE_PROVIDER_SITE_OTHER): Payer: Medicare Other | Admitting: Physician Assistant

## 2016-04-16 VITALS — BP 130/80 | HR 84 | Temp 97.7°F | Resp 17 | Ht 62.0 in | Wt 195.0 lb

## 2016-04-16 DIAGNOSIS — R030 Elevated blood-pressure reading, without diagnosis of hypertension: Secondary | ICD-10-CM | POA: Diagnosis not present

## 2016-04-16 DIAGNOSIS — S6991XA Unspecified injury of right wrist, hand and finger(s), initial encounter: Secondary | ICD-10-CM | POA: Diagnosis not present

## 2016-04-16 DIAGNOSIS — S61011A Laceration without foreign body of right thumb without damage to nail, initial encounter: Secondary | ICD-10-CM

## 2016-04-16 DIAGNOSIS — Z23 Encounter for immunization: Secondary | ICD-10-CM | POA: Diagnosis not present

## 2016-04-16 DIAGNOSIS — M79644 Pain in right finger(s): Secondary | ICD-10-CM

## 2016-04-16 MED ORDER — CEFTRIAXONE SODIUM 1 G IJ SOLR
1.0000 g | Freq: Once | INTRAMUSCULAR | Status: AC
  Administered 2016-04-16: 1 g via INTRAMUSCULAR

## 2016-04-16 NOTE — Progress Notes (Signed)
04/16/2016 3:35 PM   DOB: 11-06-1925 / MRN: [id number]  SUBJECTIVE:  Traci Richardson is a 83 y.o. female presenting for a laceration to the right thumb.  This occurred yesterday at about 5 pm.  She associates mild DIP pain but denies any loss or function. We have called her PCP and she has not had a tetanus booster in the last ten years.   Immunization History  Administered Date(s) Administered  . Td 04/16/2016     She is allergic to sulfonamide derivatives; codeine; and latex.   She  has a past medical history of Hypothyroidism.    She  reports that she has never smoked. She has never used smokeless tobacco. She reports that she does not drink alcohol or use drugs. She  reports that she does not engage in sexual activity. The patient  has a past surgical history that includes LEFT KNEE SURGERY; Cataract extraction, bilateral; and Cholecystectomy.  Her family history is not on file.  Review of Systems  Constitutional: Negative for chills and fever.  Musculoskeletal: Positive for joint pain. Negative for back pain, falls, myalgias and neck pain.  Skin: Negative for rash.    The problem list and medications were reviewed and updated by myself where necessary and exist elsewhere in the encounter.   OBJECTIVE:  BP 130/80 (BP Location: Left Arm, Cuff Size: Small)   Pulse 84   Temp 97.7 F (36.5 C) (Oral)   Resp 17   Ht 5\' 2"  (1.575 m)   Wt 195 lb (88.5 kg)   SpO2 95%   BMI 35.67 kg/m   Physical Exam  Constitutional: She is active.  Non-toxic appearance.  Cardiovascular: Normal rate.   Pulmonary/Chest: Effort normal. No tachypnea.  Neurological: She is alert.  Skin: Skin is warm and dry. She is not diaphoretic. No pallor.        Risk and benefits discussed and verbal consent obtained. Anesthetic allergies reviewed. Patient anesthetized using 1:1 mix of 2% lidocaine without epi and Marcaine. The wound was cleansed thoroughly with soap and water. Sterile prep and drape.  Wound closed with 6 throws using 4-0 Ethilon suture material. Hemostasis achieved. Mupirocin applied to the wound and bandage placed. The patient tolerated well. Wound instructions were provided and the patient is to return in 10 days for suture removal.    No results found for this or any previous visit (from the past 72 hour(s)).  Dg Finger Thumb Right  Result Date: 04/16/2016 CLINICAL DATA:  Crush injury of the thumb in a car door. For it preserved range of motion. Mild tenderness. EXAM: RIGHT THUMB 2+V COMPARISON:  None in PACs FINDINGS: The bones are subjectively osteopenic. There is narrowing of the interphalangeal joint. There is subtle lucency that extends through the dorsal aspect of the base of the distal phalanx which may reflect a nondisplaced fracture. The proximal phalanx exhibits no evidence of fracture. The first metacarpal and the first MCP joint exhibit no acute abnormalities. The soft tissues are mildly swollen over the dorsum of the IP joint. IMPRESSION: Possible nondisplaced fracture through the dorsal margin of the base of the distal phalanx seen on one view only. Correlation with any symptoms here would be useful. There is mild osteoarthritic joint space loss of the IP joint. Electronically Signed   By: David  Martinique M.D.   On: 04/16/2016 14:35    ASSESSMENT AND PLAN:  [name] was seen today for finger injury.  Diagnoses and all orders for this visit:  Thumb pain,  right: See problem 2.  -     DG Finger Thumb Right; Future  Laceration of right thumb without foreign body without damage to nail, initial encounter: The laceration has been repaired here.  Given the possibility of fracture we have called hand who advised they would like to see her tomorrow.  Appointment has been made.       -     Ceftriaxone 1 gram IM  Elevated blood pressure reading -     Recheck vitals    The patient is advised to call or return to clinic if she does not see an improvement in symptoms, or  to seek the care of the closest emergency department if she worsens with the above plan.   Philis Fendt, MHS, PA-C [facility] 04/16/2016 3:35 PM

## 2016-04-16 NOTE — Patient Instructions (Addendum)
You are scheduled to see [facility] @ [facility] tomorrow 04/17/2016. The address is [place] [address] Please bring copy of disc    IF you received an x-ray today, you will receive an invoice from [facility]. Please contact [facility] at [phone] with questions or concerns regarding your invoice.   IF you received labwork today, you will receive an invoice from [facility]. Please contact [facility] at [phone] with questions or concerns regarding your invoice.   Our billing staff will not be able to assist you with questions regarding bills from these companies.  You will be contacted with the lab results as soon as they are available. The fastest way to get your results is to activate your My Chart account. Instructions are located on the last page of this paperwork. If you have not heard from [place] regarding the results in 2 weeks, please contact this office.

## 2016-04-17 DIAGNOSIS — S62524B Nondisplaced fracture of distal phalanx of right thumb, initial encounter for open fracture: Secondary | ICD-10-CM | POA: Diagnosis not present

## 2016-05-01 DIAGNOSIS — S62524B Nondisplaced fracture of distal phalanx of right thumb, initial encounter for open fracture: Secondary | ICD-10-CM | POA: Diagnosis not present

## 2016-05-01 DIAGNOSIS — N3020 Other chronic cystitis without hematuria: Secondary | ICD-10-CM | POA: Diagnosis not present

## 2016-06-04 DIAGNOSIS — G8929 Other chronic pain: Secondary | ICD-10-CM | POA: Diagnosis not present

## 2016-06-04 DIAGNOSIS — M25561 Pain in right knee: Secondary | ICD-10-CM | POA: Diagnosis not present

## 2016-06-04 DIAGNOSIS — M25562 Pain in left knee: Secondary | ICD-10-CM | POA: Diagnosis not present

## 2016-06-24 DIAGNOSIS — Z124 Encounter for screening for malignant neoplasm of cervix: Secondary | ICD-10-CM | POA: Diagnosis not present

## 2016-06-24 DIAGNOSIS — R1909 Other intra-abdominal and pelvic swelling, mass and lump: Secondary | ICD-10-CM | POA: Diagnosis not present

## 2016-06-24 DIAGNOSIS — N83291 Other ovarian cyst, right side: Secondary | ICD-10-CM | POA: Diagnosis not present

## 2016-06-24 DIAGNOSIS — Z1231 Encounter for screening mammogram for malignant neoplasm of breast: Secondary | ICD-10-CM | POA: Diagnosis not present

## 2016-06-24 DIAGNOSIS — N950 Postmenopausal bleeding: Secondary | ICD-10-CM | POA: Diagnosis not present

## 2016-06-27 ENCOUNTER — Other Ambulatory Visit: Payer: Self-pay | Admitting: Obstetrics and Gynecology

## 2016-06-30 ENCOUNTER — Encounter (HOSPITAL_COMMUNITY): Payer: Self-pay

## 2016-06-30 ENCOUNTER — Other Ambulatory Visit: Payer: Self-pay

## 2016-06-30 ENCOUNTER — Encounter (HOSPITAL_COMMUNITY)
Admission: RE | Admit: 2016-06-30 | Discharge: 2016-06-30 | Disposition: A | Discharge status: Home / Self Care | Payer: Medicare Other | Source: Ambulatory Visit | Attending: Obstetrics and Gynecology | Admitting: Obstetrics and Gynecology

## 2016-06-30 DIAGNOSIS — M1990 Unspecified osteoarthritis, unspecified site: Secondary | ICD-10-CM | POA: Diagnosis not present

## 2016-06-30 DIAGNOSIS — Z7982 Long term (current) use of aspirin: Secondary | ICD-10-CM | POA: Diagnosis not present

## 2016-06-30 DIAGNOSIS — F329 Major depressive disorder, single episode, unspecified: Secondary | ICD-10-CM | POA: Diagnosis not present

## 2016-06-30 DIAGNOSIS — F419 Anxiety disorder, unspecified: Secondary | ICD-10-CM | POA: Diagnosis not present

## 2016-06-30 DIAGNOSIS — E039 Hypothyroidism, unspecified: Secondary | ICD-10-CM | POA: Diagnosis not present

## 2016-06-30 DIAGNOSIS — N950 Postmenopausal bleeding: Secondary | ICD-10-CM | POA: Diagnosis not present

## 2016-06-30 DIAGNOSIS — N952 Postmenopausal atrophic vaginitis: Secondary | ICD-10-CM | POA: Diagnosis not present

## 2016-06-30 DIAGNOSIS — N840 Polyp of corpus uteri: Secondary | ICD-10-CM | POA: Diagnosis not present

## 2016-06-30 DIAGNOSIS — C541 Malignant neoplasm of endometrium: Secondary | ICD-10-CM | POA: Diagnosis not present

## 2016-06-30 DIAGNOSIS — Z91040 Latex allergy status: Secondary | ICD-10-CM | POA: Diagnosis not present

## 2016-06-30 DIAGNOSIS — Z885 Allergy status to narcotic agent status: Secondary | ICD-10-CM | POA: Diagnosis not present

## 2016-06-30 DIAGNOSIS — E7800 Pure hypercholesterolemia, unspecified: Secondary | ICD-10-CM | POA: Diagnosis not present

## 2016-06-30 DIAGNOSIS — Z79899 Other long term (current) drug therapy: Secondary | ICD-10-CM | POA: Diagnosis not present

## 2016-06-30 DIAGNOSIS — Z882 Allergy status to sulfonamides status: Secondary | ICD-10-CM | POA: Diagnosis not present

## 2016-06-30 HISTORY — DX: Major depressive disorder, single episode, unspecified: F32.9

## 2016-06-30 HISTORY — DX: Anxiety disorder, unspecified: F41.9

## 2016-06-30 HISTORY — DX: Unspecified osteoarthritis, unspecified site: M19.90

## 2016-06-30 HISTORY — DX: Depression, unspecified: F32.A

## 2016-06-30 LAB — CBC
HCT: 41.4 % (ref 36.0–46.0)
Hemoglobin: 13.8 g/dL (ref 12.0–15.0)
MCH: 31.9 pg (ref 26.0–34.0)
MCHC: 33.3 g/dL (ref 30.0–36.0)
MCV: 95.6 fL (ref 78.0–100.0)
Platelets: 177 10*3/uL (ref 150–400)
RBC: 4.33 MIL/uL (ref 3.87–5.11)
RDW: 14.1 % (ref 11.5–15.5)
WBC: 8.3 10*3/uL (ref 4.0–10.5)

## 2016-06-30 LAB — BASIC METABOLIC PANEL
Anion gap: 8 (ref 5–15)
BUN: 25 mg/dL — ABNORMAL HIGH (ref 6–20)
CO2: 25 mmol/L (ref 22–32)
Calcium: 9.6 mg/dL (ref 8.9–10.3)
Chloride: 107 mmol/L (ref 101–111)
Creatinine, Ser: 0.9 mg/dL (ref 0.44–1.00)
GFR calc Af Amer: >60 mL/min (ref >60)
GFR calc non Af Amer: 55 mL/min — ABNORMAL LOW (ref >60)
Glucose, Bld: 103 mg/dL — ABNORMAL HIGH (ref 65–99)
Potassium: 4.9 mmol/L (ref 3.5–5.1)
Sodium: 140 mmol/L (ref 135–145)

## 2016-06-30 NOTE — Patient Instructions (Addendum)
Your procedure is scheduled on:  Tomorrow, July 01, 2016  Enter through the [employer] [facility] at:  7:00 AM  Pick up the phone at the desk and dial [phone].  Call this number if you have problems the morning of surgery: [phone].  Remember: Do NOT eat food or drink after:  Midnight Tonight  Take these medicines the morning of surgery with a SIP OF WATER:  Buspirone, Zetia, Synthroid, Simvastatin  Stop ALL herbal medications at this time  Do NOT smoke the day of surgery.  Do NOT wear jewelry (body piercing), metal hair clips/bobby pins, make-up, artifical eyelashes or nail polish. Do NOT wear lotions, powders, or perfumes.  You may wear deodorant. Do NOT shave for 48 hours prior to surgery. Do NOT bring valuables to the hospital. Contacts, dentures, or bridgework may not be worn into surgery.  Have a responsible adult drive you home and stay with you for 24 hours after your procedure  Bring a copy of your healthcare power of attorney and living will documents.

## 2016-07-01 ENCOUNTER — Ambulatory Visit (HOSPITAL_COMMUNITY): Payer: Medicare Other | Admitting: Anesthesiology

## 2016-07-01 ENCOUNTER — Encounter (HOSPITAL_COMMUNITY)
Admission: RE | Disposition: A | Discharge status: Home / Self Care | Payer: Self-pay | Source: Ambulatory Visit | Attending: Obstetrics and Gynecology

## 2016-07-01 ENCOUNTER — Encounter (HOSPITAL_COMMUNITY): Payer: Self-pay | Admitting: *Deleted

## 2016-07-01 ENCOUNTER — Ambulatory Visit (HOSPITAL_COMMUNITY)
Admission: RE | Admit: 2016-07-01 | Discharge: 2016-07-01 | Disposition: A | Discharge status: Home / Self Care | Payer: Medicare Other | Source: Ambulatory Visit | Attending: Obstetrics and Gynecology | Admitting: Obstetrics and Gynecology

## 2016-07-01 DIAGNOSIS — F329 Major depressive disorder, single episode, unspecified: Secondary | ICD-10-CM | POA: Diagnosis not present

## 2016-07-01 DIAGNOSIS — C541 Malignant neoplasm of endometrium: Secondary | ICD-10-CM | POA: Insufficient documentation

## 2016-07-01 DIAGNOSIS — N840 Polyp of corpus uteri: Secondary | ICD-10-CM | POA: Insufficient documentation

## 2016-07-01 DIAGNOSIS — E039 Hypothyroidism, unspecified: Secondary | ICD-10-CM | POA: Insufficient documentation

## 2016-07-01 DIAGNOSIS — Z882 Allergy status to sulfonamides status: Secondary | ICD-10-CM | POA: Insufficient documentation

## 2016-07-01 DIAGNOSIS — N858 Other specified noninflammatory disorders of uterus: Secondary | ICD-10-CM | POA: Diagnosis not present

## 2016-07-01 DIAGNOSIS — Z885 Allergy status to narcotic agent status: Secondary | ICD-10-CM | POA: Diagnosis not present

## 2016-07-01 DIAGNOSIS — M1990 Unspecified osteoarthritis, unspecified site: Secondary | ICD-10-CM | POA: Diagnosis not present

## 2016-07-01 DIAGNOSIS — N952 Postmenopausal atrophic vaginitis: Secondary | ICD-10-CM | POA: Diagnosis not present

## 2016-07-01 DIAGNOSIS — E7800 Pure hypercholesterolemia, unspecified: Secondary | ICD-10-CM | POA: Insufficient documentation

## 2016-07-01 DIAGNOSIS — N950 Postmenopausal bleeding: Secondary | ICD-10-CM | POA: Insufficient documentation

## 2016-07-01 DIAGNOSIS — Z7982 Long term (current) use of aspirin: Secondary | ICD-10-CM | POA: Insufficient documentation

## 2016-07-01 DIAGNOSIS — Z79899 Other long term (current) drug therapy: Secondary | ICD-10-CM | POA: Insufficient documentation

## 2016-07-01 DIAGNOSIS — Z91040 Latex allergy status: Secondary | ICD-10-CM | POA: Insufficient documentation

## 2016-07-01 DIAGNOSIS — F419 Anxiety disorder, unspecified: Secondary | ICD-10-CM | POA: Insufficient documentation

## 2016-07-01 DIAGNOSIS — I959 Hypotension, unspecified: Secondary | ICD-10-CM | POA: Diagnosis not present

## 2016-07-01 HISTORY — PX: DILATATION & CURETTAGE/HYSTEROSCOPY WITH MYOSURE: SHX6511

## 2016-07-01 SURGERY — DILATATION & CURETTAGE/HYSTEROSCOPY WITH MYOSURE
Anesthesia: General | Site: Vagina

## 2016-07-01 MED ORDER — EPHEDRINE 5 MG/ML INJ
INTRAVENOUS | Status: AC
  Filled 2016-07-01: qty 10

## 2016-07-01 MED ORDER — ONDANSETRON HCL 4 MG/2ML IJ SOLN
INTRAMUSCULAR | Status: AC
  Filled 2016-07-01: qty 2

## 2016-07-01 MED ORDER — MIDAZOLAM HCL 2 MG/2ML IJ SOLN
INTRAMUSCULAR | Status: AC
Start: 2016-07-01 — End: 2016-07-01
  Filled 2016-07-01: qty 2

## 2016-07-01 MED ORDER — ONDANSETRON HCL 4 MG/2ML IJ SOLN
INTRAMUSCULAR | Status: DC | PRN
  Administered 2016-07-01: 4 mg via INTRAVENOUS

## 2016-07-01 MED ORDER — FENTANYL CITRATE (PF) 100 MCG/2ML IJ SOLN
INTRAMUSCULAR | Status: AC
Start: 2016-07-01 — End: 2016-07-01
  Filled 2016-07-01: qty 2

## 2016-07-01 MED ORDER — LIDOCAINE HCL (CARDIAC) 20 MG/ML IV SOLN
INTRAVENOUS | Status: AC
  Filled 2016-07-01: qty 5

## 2016-07-01 MED ORDER — FENTANYL CITRATE (PF) 100 MCG/2ML IJ SOLN
INTRAMUSCULAR | Status: DC | PRN
  Administered 2016-07-01: 50 ug via INTRAVENOUS

## 2016-07-01 MED ORDER — LACTATED RINGERS IV SOLN
INTRAVENOUS | Status: DC
  Administered 2016-07-01: 08:00:00 via INTRAVENOUS

## 2016-07-01 MED ORDER — HYDROMORPHONE HCL 1 MG/ML IJ SOLN: 0.2500 mg | INTRAMUSCULAR | Status: DC | PRN

## 2016-07-01 MED ORDER — PROPOFOL 10 MG/ML IV BOLUS
INTRAVENOUS | Status: DC | PRN
  Administered 2016-07-01: 170 mg via INTRAVENOUS

## 2016-07-01 MED ORDER — EPHEDRINE SULFATE 50 MG/ML IJ SOLN
INTRAMUSCULAR | Status: DC | PRN
  Administered 2016-07-01: 10 mg via INTRAVENOUS

## 2016-07-01 MED ORDER — DEXAMETHASONE SODIUM PHOSPHATE 10 MG/ML IJ SOLN
INTRAMUSCULAR | Status: DC | PRN
  Administered 2016-07-01: 4 mg via INTRAVENOUS

## 2016-07-01 MED ORDER — PROMETHAZINE HCL 25 MG/ML IJ SOLN: 6.2500 mg | INTRAMUSCULAR | Status: DC | PRN

## 2016-07-01 MED ORDER — PROPOFOL 10 MG/ML IV BOLUS
INTRAVENOUS | Status: AC
  Filled 2016-07-01: qty 20

## 2016-07-01 MED ORDER — LIDOCAINE HCL (CARDIAC) 20 MG/ML IV SOLN
INTRAVENOUS | Status: DC | PRN
  Administered 2016-07-01: 80 mg via INTRAVENOUS

## 2016-07-01 SURGICAL SUPPLY — 17 items
CANISTER SUCT 3000ML PPV (MISCELLANEOUS) ×2 IMPLANT
CATH ROBINSON RED A/P 16FR (CATHETERS) ×2 IMPLANT
CLOTH BEACON ORANGE TIMEOUT ST (SAFETY) ×2 IMPLANT
CONTAINER PREFILL 10% NBF 60ML (FORM) ×4 IMPLANT
DEVICE MYOSURE LITE (MISCELLANEOUS) ×2 IMPLANT
DEVICE MYOSURE REACH (MISCELLANEOUS) IMPLANT
FILTER ARTHROSCOPY CONVERTOR (FILTER) ×2 IMPLANT
GLOVE BIOGEL PI IND STRL 7.0 (GLOVE) ×2 IMPLANT
GLOVE BIOGEL PI INDICATOR 7.0 (GLOVE) ×2
GLOVE ECLIPSE 6.5 STRL STRAW (GLOVE) ×2 IMPLANT
GOWN STRL REUS W/TWL LRG LVL3 (GOWN DISPOSABLE) ×4 IMPLANT
PACK VAGINAL MINOR WOMEN LF (CUSTOM PROCEDURE TRAY) ×2 IMPLANT
PAD OB MATERNITY 4.3X12.25 (PERSONAL CARE ITEMS) ×2 IMPLANT
SEAL ROD LENS SCOPE MYOSURE (ABLATOR) ×2 IMPLANT
TOWEL OR 17X24 6PK STRL BLUE (TOWEL DISPOSABLE) ×4 IMPLANT
TUBING AQUILEX INFLOW (TUBING) ×2 IMPLANT
TUBING AQUILEX OUTFLOW (TUBING) ×2 IMPLANT

## 2016-07-01 NOTE — Anesthesia Postprocedure Evaluation (Signed)
Anesthesia Post Note  Patient: Traci Richardson  Procedure(s) Performed: Procedure(s) (LRB): DILATATION & CURETTAGE/HYSTEROSCOPY WITH MYOSURE (N/A)     Patient location during evaluation: PACU Anesthesia Type: General Level of consciousness: awake and alert Pain management: pain level controlled Vital Signs Assessment: post-procedure vital signs reviewed and stable Respiratory status: spontaneous breathing, nonlabored ventilation, respiratory function stable and patient connected to nasal cannula oxygen Cardiovascular status: blood pressure returned to baseline and stable Postop Assessment: no signs of nausea or vomiting Anesthetic complications: no    Last Vitals:  Vitals:   07/01/16 0945 07/01/16 1030  BP: (!) 160/86 (!) 160/76  Pulse: (!) 56 62  Resp: 20 16  Temp: 36.8 C 36.8 C    Last Pain:  Vitals:   07/01/16 1030  TempSrc:   PainSc: 0-No pain   Pain Goal: Patients Stated Pain Goal: 4 (07/01/16 1030)               Tiajuana Amass

## 2016-07-01 NOTE — Transfer of Care (Signed)
Immediate Anesthesia Transfer of Care Note  Patient: LEEAN AMEZCUA  Procedure(s) Performed: Procedure(s): DILATATION & CURETTAGE/HYSTEROSCOPY WITH MYOSURE (N/A)  Patient Location: PACU  Anesthesia Type:General  Level of Consciousness: sedated  Airway & Oxygen Therapy: Patient Spontanous Breathing and Patient connected to nasal cannula oxygen  Post-op Assessment: Report given to RN  Post vital signs: Reviewed and stable  Last Vitals:  Vitals:   07/01/16 0745  BP: (!) 154/96  Pulse: 78  Resp: 16  Temp: 36.6 C    Last Pain:  Vitals:   07/01/16 0745  TempSrc: Oral  PainSc: 0-No pain      Patients Stated Pain Goal: 4 (47/65/46 5035)  Complications: No apparent anesthesia complications

## 2016-07-01 NOTE — Anesthesia Preprocedure Evaluation (Addendum)
Anesthesia Evaluation  Patient identified by MRN, date of birth, ID band Patient awake    Reviewed: Allergy & Precautions, NPO status , Patient's Chart, lab work & pertinent test results  Airway Mallampati: II  TM Distance: >3 FB Neck ROM: Full    Dental  (+) Dental Advisory Given   Pulmonary neg pulmonary ROS,    breath sounds clear to auscultation       Cardiovascular negative cardio ROS   Rhythm:Regular Rate:Normal     Neuro/Psych Anxiety Depression negative neurological ROS     GI/Hepatic negative GI ROS, Neg liver ROS,   Endo/Other  Hypothyroidism   Renal/GU negative Renal ROS     Musculoskeletal  (+) Arthritis ,   Abdominal   Peds  Hematology negative hematology ROS (+)   Anesthesia Other Findings   Reproductive/Obstetrics                            Lab Results  Component Value Date   WBC 8.3 06/30/2016   HGB 13.8 06/30/2016   HCT 41.4 06/30/2016   MCV 95.6 06/30/2016   PLT 177 06/30/2016   Lab Results  Component Value Date   CREATININE 0.90 06/30/2016   BUN 25 (H) 06/30/2016   NA 140 06/30/2016   K 4.9 06/30/2016   CL 107 06/30/2016   CO2 25 06/30/2016    Anesthesia Physical Anesthesia Plan  ASA: II  Anesthesia Plan: General   Post-op Pain Management:    Induction: Intravenous  PONV Risk Score and Plan: 4 or greater and Ondansetron, Dexamethasone, Propofol and Treatment may vary due to age or medical condition  Airway Management Planned: LMA  Additional Equipment:   Intra-op Plan:   Post-operative Plan: Extubation in OR  Informed Consent: I have reviewed the patients History and Physical, chart, labs and discussed the procedure including the risks, benefits and alternatives for the proposed anesthesia with the patient or authorized representative who has indicated his/her understanding and acceptance.   Dental advisory given  Plan Discussed with:  CRNA  Anesthesia Plan Comments:         Anesthesia Quick Evaluation

## 2016-07-01 NOTE — Brief Op Note (Signed)
07/01/2016  9:16 AM  PATIENT:  Traci Richardson  83 y.o. female  PRE-OPERATIVE DIAGNOSIS:  Postmenopausal Bleeding, Endometrial Mass  POST-OPERATIVE DIAGNOSIS:  post menopausal bleeding, endometrial polyp  PROCEDURE: Diagnostic hysteroscopy, hysteroscopic resection of endometrial polyp, dilation and curettage  SURGEON:  Surgeon(s) and Role:    Servando Salina, MD - Primary  PHYSICIAN ASSISTANT:   ASSISTANTS: none   ANESTHESIA:   general FINDINGS;  ENDOM polyp, posterior endometrial thickening, right tubal ostia seen left tubal ostia not seen EBL:  Total I/O In: 800 [I.V.:800] Out: 60 [Urine:50; Blood:10]  BLOOD ADMINISTERED:none  DRAINS: none   LOCAL MEDICATIONS USED:  NONE  SPECIMEN:  Source of Specimen:  emc with endom polyp  DISPOSITION OF SPECIMEN:  PATHOLOGY  COUNTS:  YES  TOURNIQUET:  * No tourniquets in log *  DICTATION: .Other Dictation: Dictation Number [id number]  PLAN OF CARE: Discharge to home after PACU  PATIENT DISPOSITION:  PACU - hemodynamically stable.   Delay start of Pharmacological VTE agent (>24hrs) due to surgical blood loss or risk of bleeding: no

## 2016-07-01 NOTE — H&P (Signed)
Traci Richardson is an 83 y.o. female widowed WF here for surgical mgmt of endometrial mass noted on sonohysterogram done for PMB and endometrial thickening. Pt has had PMB intermittent for a year  Pertinent Gynecological History: Menses: post-menopausal Bleeding: post menopausal bleeding Contraception: none DES exposure: denies Blood transfusions: none Sexually transmitted diseases: no past history Previous GYN Procedures: none  Last mammogram: normal Date:2018 Last pap: normal Date: 2018 OB History:    Menstrual History: Menarche age:n/a No LMP recorded. Patient is postmenopausal.    Past Medical History:  Diagnosis Date  . Anxiety   . Arthritis   . Depression   . High cholesterol   . Hypothyroidism   . Pneumonia   . UTI (urinary tract infection)     Past Surgical History:  Procedure Laterality Date  . CATARACT EXTRACTION, BILATERAL    . CHOLECYSTECTOMY     then a repair of wound opening 2 years later  . COLONOSCOPY    . LEFT KNEE SURGERY      History reviewed. No pertinent family history.  Social History:  reports that she has never smoked. She has never used smokeless tobacco. She reports that she does not drink alcohol or use drugs.  Allergies:  Allergies  Allergen Reactions  . Sulfonamide Derivatives Hives  . Codeine Other (See Comments)    Passes out  . Tramadol Other (See Comments)    Mood changes including confusion.  . Latex Itching and Rash    On hands w/latex gloves    Prescriptions Prior to Admission  Medication Sig Dispense Refill Last Dose  . acetaminophen (TYLENOL) 500 MG tablet Take 500 mg by mouth daily as needed (for pain (in the afternoon as needed)).   06/30/2016 at Unknown time  . aspirin EC 81 MG tablet Take 81 mg by mouth daily after breakfast.   Past Week at Unknown time  . busPIRone (BUSPAR) 5 MG tablet Take 5 mg by mouth daily after breakfast.   07/01/2016 at Unknown time  . ezetimibe (ZETIA) 10 MG tablet Take 10 mg by mouth daily  after breakfast.  6 Past Week at Unknown time  . levothyroxine (SYNTHROID, LEVOTHROID) 100 MCG tablet Take 100 mcg by mouth daily before breakfast. On an empty stomach   07/01/2016 at Unknown time  . misoprostol (CYTOTEC) 200 MCG tablet Place 200 mcg vaginally once.   07/01/2016 at Unknown time  . naproxen sodium (ANAPROX) 220 MG tablet Take 440 mg by mouth daily after breakfast.   06/30/2016 at Unknown time  . Polyethyl Glycol-Propyl Glycol (SYSTANE ULTRA) 0.4-0.3 % SOLN Place 1-2 drops into both eyes daily as needed (for dry eyes).   Past Week at Unknown time  . simvastatin (ZOCOR) 20 MG tablet Take 20 mg by mouth daily after breakfast.  6 06/30/2016 at Unknown time  . vitamin B-12 (CYANOCOBALAMIN) 500 MCG tablet Take 500 mcg by mouth daily after breakfast.   06/30/2016 at Unknown time  . Vitamin D, Ergocalciferol, (DRISDOL) 50000 units CAPS capsule Take 50,000 Units by mouth 2 (two) times a week. Tuesday & Thursday after breakfast  3 Past Week at Unknown time    Review of Systems  All other systems reviewed and are negative.   Blood pressure (!) 154/96, pulse 78, temperature 97.8 F (36.6 C), temperature source Oral, resp. rate 16, SpO2 98 %. Physical Exam  Constitutional: She is oriented to person, place, and time. She appears well-developed and well-nourished.  HENT:  Head: Atraumatic.  Eyes: EOM are normal.  Neck: Neck supple.  Cardiovascular: Regular rhythm.   Respiratory: Breath sounds normal.  GI: Soft.  Genitourinary:  Genitourinary Comments: Vulva nl Adnexa no palp Cervix parous Vagina: atrophic vagina Uterus nl  Neurological: She is alert and oriented to person, place, and time.  Skin: Skin is warm and dry.  Psychiatric: She has a normal mood and affect.    Results for orders placed or performed during the hospital encounter of 06/30/16 (from the past 24 hour(s))  Basic metabolic panel     Status: Abnormal   Collection Time: 06/30/16  2:50 PM  Result Value Ref Range    Sodium 140 135 - 145 mmol/L   Potassium 4.9 3.5 - 5.1 mmol/L   Chloride 107 101 - 111 mmol/L   CO2 25 22 - 32 mmol/L   Glucose, Bld 103 (H) 65 - 99 mg/dL   BUN 25 (H) 6 - 20 mg/dL   Creatinine, Ser 0.90 0.44 - 1.00 mg/dL   Calcium 9.6 8.9 - 10.3 mg/dL   GFR calc non Af Amer 55 (L) >60 mL/min   GFR calc Af Amer >60 >60 mL/min   Anion gap 8 5 - 15  CBC     Status: None   Collection Time: 06/30/16  2:50 PM  Result Value Ref Range   WBC 8.3 4.0 - 10.5 K/uL   RBC 4.33 3.87 - 5.11 MIL/uL   Hemoglobin 13.8 12.0 - 15.0 g/dL   HCT 41.4 36.0 - 46.0 %   MCV 95.6 78.0 - 100.0 fL   MCH 31.9 26.0 - 34.0 pg   MCHC 33.3 30.0 - 36.0 g/dL   RDW 14.1 11.5 - 15.5 %   Platelets 177 150 - 400 K/uL    No results found.  Assessment/Plan: PMB Endometrial mass P) dx hysteroscopy, D&C, hysteroscopic resection of endometrial mass. Risk of procedure includes infection, bleeding, uterine perforation and its risk, thermal injury, fluid overload and its mgmt, internal scar tissue. Injury to surrounding organ structures  Ludwig Tugwell A 07/01/2016, 7:58 AM

## 2016-07-01 NOTE — Anesthesia Procedure Notes (Signed)
Procedure Name: LMA Insertion Date/Time: 07/01/2016 8:32 AM Performed by: Casimer Lanius A Pre-anesthesia Checklist: Patient being monitored, Patient identified, Emergency Drugs available and Suction available Patient Re-evaluated:Patient Re-evaluated prior to inductionOxygen Delivery Method: Circle system utilized Preoxygenation: Pre-oxygenation with 100% oxygen Intubation Type: IV induction and Inhalational induction Ventilation: Mask ventilation without difficulty LMA: LMA inserted LMA Size: 4.0 Number of attempts: 1 Dental Injury: Teeth and Oropharynx as per pre-operative assessment

## 2016-07-01 NOTE — Discharge Instructions (Signed)
CALL  IF TEMP>100.4, NOTHING PER VAGINA X 1WK, CALL IF SOAKING A MAXI  PAD EVERY HOUR OR MORE FREQUENTLY  You may shower tomorrow.  No tub baths x 1 week.  Sanitary pads only for vaginal drainage.   No driving for the next 24 hours due to anesthesia.  You may have vaginal bleeding 23 days and spotting up to 10 days. Soreness for 1-2 weeks.    Post Anesthesia Home Care Instructions  Activity: Get plenty of rest for the remainder of the day. A responsible individual must stay with you for 24 hours following the procedure.  For the next 24 hours, DO NOT: -Drive a car -Paediatric nurse -Drink alcoholic beverages -Take any medication unless instructed by your physician -Make any legal decisions or sign important papers.  Meals: Start with liquid foods such as gelatin or soup. Progress to regular foods as tolerated. Avoid greasy, spicy, heavy foods. If nausea and/or vomiting occur, drink only clear liquids until the nausea and/or vomiting subsides. Call your physician if vomiting continues.  Special Instructions/Symptoms: Your throat may feel dry or sore from the anesthesia or the breathing tube placed in your throat during surgery. If this causes discomfort, gargle with warm salt water. The discomfort should disappear within 24 hours.

## 2016-07-02 ENCOUNTER — Encounter (HOSPITAL_COMMUNITY): Payer: Self-pay | Admitting: Obstetrics and Gynecology

## 2016-07-02 DIAGNOSIS — E784 Other hyperlipidemia: Secondary | ICD-10-CM | POA: Diagnosis not present

## 2016-07-02 DIAGNOSIS — E559 Vitamin D deficiency, unspecified: Secondary | ICD-10-CM | POA: Diagnosis not present

## 2016-07-02 DIAGNOSIS — Z1389 Encounter for screening for other disorder: Secondary | ICD-10-CM | POA: Diagnosis not present

## 2016-07-02 DIAGNOSIS — R030 Elevated blood-pressure reading, without diagnosis of hypertension: Secondary | ICD-10-CM | POA: Diagnosis not present

## 2016-07-02 DIAGNOSIS — E038 Other specified hypothyroidism: Secondary | ICD-10-CM | POA: Diagnosis not present

## 2016-07-02 DIAGNOSIS — E042 Nontoxic multinodular goiter: Secondary | ICD-10-CM | POA: Diagnosis not present

## 2016-07-02 DIAGNOSIS — C541 Malignant neoplasm of endometrium: Secondary | ICD-10-CM | POA: Diagnosis not present

## 2016-07-02 DIAGNOSIS — Z6836 Body mass index (BMI) 36.0-36.9, adult: Secondary | ICD-10-CM | POA: Diagnosis not present

## 2016-07-02 DIAGNOSIS — M810 Age-related osteoporosis without current pathological fracture: Secondary | ICD-10-CM | POA: Diagnosis not present

## 2016-07-02 NOTE — Op Note (Signed)
NAME:  Traci Richardson, Traci Richardson                     ACCOUNT NO.:  MEDICAL RECORD NO.:  [id number]  LOCATION:                                 FACILITY:  PHYSICIAN:  Servando Salina, M.D.    DATE OF BIRTH:  DATE OF PROCEDURE:  07/01/2016 DATE OF DISCHARGE:                              OPERATIVE REPORT   PREOPERATIVE DIAGNOSES: 1. Postmenopausal bleeding. 2. Endometrial mass.  PROCEDURES: 1. Diagnostic hysteroscopy. 2. Hysteroscopic resection of endometrial polyp. 3. Dilation and curettage.  POSTOPERATIVE DIAGNOSES: 1. Postmenopausal bleeding. 2. Endometrial mass.  ANESTHESIA:  General.  SURGEON:  Servando Salina, M.D.  ASSISTANT:  None.  DESCRIPTION OF PROCEDURE:  Under adequate general anesthesia, the patient was placed in dorsal lithotomy position.  She had limited mobility of her pelvis.  None the less, the patient was sterilely prepped and draped in usual fashion.  Bladder was catheterized for moderate amount of urine.  Examination under anesthesia revealed an anteverted uterus.  No adnexal masses could be appreciated.  Bivalve speculum was placed in the vagina.  Single-tooth tenaculum was placed on the anterior lip of the cervix.  The cervix was then dilated up to [facility] dilator.  A diagnostic hysteroscope was introduced into the uterine cavity.  Endometrial mass consistent with a polyp was noted as was endometrial thickening.  The right tubal ostia was seen, the left was not seen.  Using the Lite MyoSure resectoscope, the endometrial polyp and the endometrial lining were also resected.  Once the cavity was felt to have been fully resected and no lesions in the endocervical canal, the resectoscope was removed.  The cavity was then gently curetted for a scant amount of tissue.  All instruments were then removed from the vagina.  Specimen labeled endometrial curetting with polyp was sent to Pathology.  Estimated blood loss was minimal. Complication was none.  The  patient tolerated the procedure well, was transferred to the recovery room in stable condition.     Servando Salina, M.D.     [place]/MED[facility]  D:  07/01/2016  T:  07/01/2016  Job:  [id number]

## 2016-07-07 ENCOUNTER — Telehealth: Payer: Self-pay | Admitting: *Deleted

## 2016-07-07 NOTE — Telephone Encounter (Signed)
Received new patient referral from Seth Bake at [facility]. Patient scheduled for July 6th at 12pm. She will contact the patient.

## 2016-07-11 ENCOUNTER — Ambulatory Visit: Payer: Medicare Other | Attending: Gynecologic Oncology | Admitting: Gynecologic Oncology

## 2016-07-11 ENCOUNTER — Encounter: Payer: Self-pay | Admitting: Gynecologic Oncology

## 2016-07-11 VITALS — BP 139/76 | HR 79 | Temp 97.6°F | Resp 16 | Ht 72.5 in | Wt 193.2 lb

## 2016-07-11 DIAGNOSIS — C541 Malignant neoplasm of endometrium: Secondary | ICD-10-CM | POA: Diagnosis not present

## 2016-07-11 DIAGNOSIS — Z9889 Other specified postprocedural states: Secondary | ICD-10-CM | POA: Insufficient documentation

## 2016-07-11 DIAGNOSIS — Z882 Allergy status to sulfonamides status: Secondary | ICD-10-CM | POA: Insufficient documentation

## 2016-07-11 DIAGNOSIS — Z9049 Acquired absence of other specified parts of digestive tract: Secondary | ICD-10-CM | POA: Diagnosis not present

## 2016-07-11 DIAGNOSIS — Z7982 Long term (current) use of aspirin: Secondary | ICD-10-CM | POA: Diagnosis not present

## 2016-07-11 DIAGNOSIS — Z79899 Other long term (current) drug therapy: Secondary | ICD-10-CM | POA: Diagnosis not present

## 2016-07-11 NOTE — Patient Instructions (Addendum)
Traci Richardson  07/11/2016   Your procedure is scheduled on: Thursday 07/17/2016  Report to [facility] Main  Entrance Take [name]  elevators to 3rd floor to  [name]  AM.    Call this number if you have problems the morning of surgery [phone]   Remember: ONLY 1 PERSON MAY GO WITH YOU TO SHORT STAY TO GET  READY MORNING OF [name].   Eat a light diet the day before surgery.  Examples including soups, broths, toast, yogurt, mashed potatoes.  Things to avoid include carbonated beverages (fizzy beverages), raw fruits and raw vegetables, or beans.   If your bowels are filled with gas, your surgeon will have difficulty visualizing your pelvic organs which increases your surgical risks.    CLEAR LIQUID DIET   Foods Allowed                                                                     Foods Excluded  Coffee and tea, regular and decaf                             liquids that you cannot  Plain Jell-O in any flavor                                             see through such as: Fruit ices (not with fruit pulp)                                     milk, soups, orange juice  Iced Popsicles                                    All solid food                                  Cranberry, grape and apple juices Sports drinks like Gatorade Lightly seasoned clear broth or consume(fat free) Sugar, honey syrup  Sample Menu Breakfast                                Lunch                                     Supper Cranberry juice                    Beef broth                            Chicken broth Jell-O  Grape juice                           Apple juice Coffee or tea                        Jell-O                                      Popsicle                                                Coffee or tea                        Coffee or  tea  _____________________________________________________________________    Do not eat food or drink liquids :After Midnight.     Take these medicines the morning of surgery with A SIP OF WATER: Buspirone (Buspar), Levothyroxine (Synthroid)                                 You may not have any metal on your body including hair pins and              piercings  Do not wear jewelry, make-up, lotions, powders or perfumes, deodorant             Do not wear nail polish.  Do not shave  48 hours prior to surgery.              Men may shave face and neck.   Do not bring valuables to the hospital. [name].  Contacts, dentures or bridgework may not be worn into surgery.  Leave suitcase in the car. After surgery it may be brought to your room.     Patients discharged the day of surgery will not be allowed to drive home.  Name and phone number of your driver:  Special Instructions: N/A              Please read over the following fact sheets you were given: _____________________________________________________________________             [facility] - Preparing for Surgery Before surgery, you can play an important role.  Because skin is not sterile, your skin needs to be as free of germs as possible.  You can reduce the number of germs on your skin by washing with CHG (chlorahexidine gluconate) soap before surgery.  CHG is an antiseptic cleaner which kills germs and bonds with the skin to continue killing germs even after washing. Please DO NOT use if you have an allergy to CHG or antibacterial soaps.  If your skin becomes reddened/irritated stop using the CHG and inform your nurse when you arrive at Short Stay. Do not shave (including legs and underarms) for at least 48 hours prior to the first CHG shower.  You may shave your face/neck. Please follow these instructions carefully:  1.  Shower with CHG Soap the night before surgery and the   morning of Surgery.  2.  If  you choose to wash your hair, wash your hair first as usual with your  normal  shampoo.  3.  After you shampoo, rinse your hair and body thoroughly to remove the  shampoo.                           4.  Use CHG as you would any other liquid soap.  You can apply chg directly  to the skin and wash                       Gently with a scrungie or clean washcloth.  5.  Apply the CHG Soap to your body ONLY FROM THE NECK DOWN.   Do not use on face/ open                           Wound or open sores. Avoid contact with eyes, ears mouth and genitals (private parts).                       Wash face,  Genitals (private parts) with your normal soap.             6.  Wash thoroughly, paying special attention to the area where your surgery  will be performed.  7.  Thoroughly rinse your body with warm water from the neck down.  8.  DO NOT shower/wash with your normal soap after using and rinsing off  the CHG Soap.                9.  Pat yourself dry with a clean towel.            10.  Wear clean pajamas.            11.  Place clean sheets on your bed the night of your first shower and do not  sleep with pets. Day of Surgery : Do not apply any lotions/deodorants the morning of surgery.  Please wear clean clothes to the hospital/surgery center.  FAILURE TO FOLLOW THESE INSTRUCTIONS MAY RESULT IN THE CANCELLATION OF YOUR SURGERY PATIENT SIGNATURE_________________________________  NURSE SIGNATURE__________________________________  ________________________________________________________________________   Traci Richardson  An incentive spirometer is a tool that can help keep your lungs clear and active. This tool measures how well you are filling your lungs with each breath. Taking long deep breaths may help reverse or decrease the chance of developing breathing (pulmonary) problems (especially infection) following:  A long period of time when you are unable to move or be  active. BEFORE THE PROCEDURE   If the spirometer includes an indicator to show your best effort, your nurse or respiratory therapist will set it to a desired goal.  If possible, sit up straight or lean slightly forward. Try not to slouch.  Hold the incentive spirometer in an upright position. INSTRUCTIONS FOR USE  1. Sit on the edge of your bed if possible, or sit up as far as you can in bed or on a chair. 2. Hold the incentive spirometer in an upright position. 3. Breathe out normally. 4. Place the mouthpiece in your mouth and seal your lips tightly around it. 5. Breathe in slowly and as deeply as possible, raising the piston or the ball toward the top of the column. 6. Hold your breath for 3-5 seconds or for as long as possible. Allow the piston or ball to fall  to the bottom of the column. 7. Remove the mouthpiece from your mouth and breathe out normally. 8. Rest for a few seconds and repeat Steps 1 through 7 at least 10 times every 1-2 hours when you are awake. Take your time and take a few normal breaths between deep breaths. 9. The spirometer may include an indicator to show your best effort. Use the indicator as a goal to work toward during each repetition. 10. After each set of 10 deep breaths, practice coughing to be sure your lungs are clear. If you have an incision (the cut made at the time of surgery), support your incision when coughing by placing a pillow or rolled up towels firmly against it. Once you are able to get out of bed, walk around indoors and cough well. You may stop using the incentive spirometer when instructed by your caregiver.  RISKS AND COMPLICATIONS  Take your time so you do not get dizzy or light-headed.  If you are in pain, you may need to take or ask for pain medication before doing incentive spirometry. It is harder to take a deep breath if you are having pain. AFTER USE  Rest and breathe slowly and easily.  It can be helpful to keep track of a log of  your progress. Your caregiver can provide you with a simple table to help with this. If you are using the spirometer at home, follow these instructions: [place] IF:   You are having difficultly using the spirometer.  You have trouble using the spirometer as often as instructed.  Your pain medication is not giving enough relief while using the spirometer.  You develop fever of 100.5 F (38.1 C) or higher. SEEK IMMEDIATE MEDICAL CARE IF:   You cough up bloody sputum that had not been present before.  You develop fever of 102 F (38.9 C) or greater.  You develop worsening pain at or near the incision site. MAKE SURE YOU:   Understand these instructions.  Will watch your condition.  Will get help right away if you are not doing well or get worse. Document Released: 05/05/2006 Document Revised: 03/17/2011 Document Reviewed: 07/06/2006 Exit[employer] Patient Information 2014 Exit[employer], [place].   ________________________________________________________________________  WHAT IS A BLOOD TRANSFUSION? Blood Transfusion Information  A transfusion is the replacement of blood or some of its parts. Blood is made up of multiple cells which provide different functions.  Red blood cells carry oxygen and are used for blood loss replacement.  White blood cells fight against infection.  Platelets control bleeding.  Plasma helps clot blood.  Other blood products are available for specialized needs, such as hemophilia or other clotting disorders. BEFORE THE TRANSFUSION  Who gives blood for transfusions?   Healthy volunteers who are fully evaluated to make sure their blood is safe. This is blood bank blood. Transfusion therapy is the safest it has ever been in the practice of medicine. Before blood is taken from a donor, a complete history is taken to make sure that person has no history of diseases nor engages in risky social behavior (examples are intravenous drug use or sexual activity  with multiple partners). The donor's travel history is screened to minimize risk of transmitting infections, such as malaria. The donated blood is tested for signs of infectious diseases, such as HIV and hepatitis. The blood is then tested to be sure it is compatible with you in order to minimize the chance of a transfusion reaction. If you or a relative donates  blood, this is often done in anticipation of surgery and is not appropriate for emergency situations. It takes many days to process the donated blood. RISKS AND COMPLICATIONS Although transfusion therapy is very safe and saves many lives, the main dangers of transfusion include:   Getting an infectious disease.  Developing a transfusion reaction. This is an allergic reaction to something in the blood you were given. Every precaution is taken to prevent this. The decision to have a blood transfusion has been considered carefully by your caregiver before blood is given. Blood is not given unless the benefits outweigh the risks. AFTER THE TRANSFUSION  Right after receiving a blood transfusion, you will usually feel much better and more energetic. This is especially true if your red blood cells have gotten low (anemic). The transfusion raises the level of the red blood cells which carry oxygen, and this usually causes an energy increase.  The nurse administering the transfusion will monitor you carefully for complications. HOME CARE INSTRUCTIONS  No special instructions are needed after a transfusion. You may find your energy is better. Speak with your caregiver about any limitations on activity for underlying diseases you may have. SEEK MEDICAL CARE IF:   Your condition is not improving after your transfusion.  You develop redness or irritation at the intravenous (IV) site. SEEK IMMEDIATE MEDICAL CARE IF:  Any of the following symptoms occur over the next 12 hours:  Shaking chills.  You have a temperature by mouth above 102 F (38.9  C), not controlled by medicine.  Chest, back, or muscle pain.  People around you feel you are not acting correctly or are confused.  Shortness of breath or difficulty breathing.  Dizziness and fainting.  You get a rash or develop hives.  You have a decrease in urine output.  Your urine turns a dark color or changes to pink, red, or brown. Any of the following symptoms occur over the next 10 days:  You have a temperature by mouth above 102 F (38.9 C), not controlled by medicine.  Shortness of breath.  Weakness after normal activity.  The white part of the eye turns yellow (jaundice).  You have a decrease in the amount of urine or are urinating less often.  Your urine turns a dark color or changes to pink, red, or brown. Document Released: 12/21/1999 Document Revised: 03/17/2011 Document Reviewed: 08/09/2007 [facility] Patient Information 2014 [place], [place].  _______________________________________________________________________

## 2016-07-11 NOTE — Patient Instructions (Signed)
Preparing for your Surgery  Plan for surgery on July 17, 2016 with Dr. Everitt Amber at [place] will be scheduled for a robotic assisted total hysterectomy, bilateral salpingo-oophorectomy, sentinel lymph node biopsy.  Pre-operative Testing -You will receive a phone call from presurgical testing at [facility] to arrange for a pre-operative testing appointment before your surgery.  This appointment normally occurs one to two weeks before your scheduled surgery.   -Bring your insurance card, copy of an advanced directive if applicable, medication list  -At that visit, you will be asked to sign a consent for a possible blood transfusion in case a transfusion becomes necessary during surgery.  The need for a blood transfusion is rare but having consent is a necessary part of your care.     -You should not be taking blood thinners or aspirin at least ten days prior to surgery unless instructed by your surgeon.  Day Before Surgery at [place] will be asked to take in a light diet the day before surgery.  Avoid carbonated beverages.  You will be advised to have nothing to eat or drink after midnight the evening before.     Eat a light diet the day before surgery.  Examples including soups, broths, toast, yogurt, mashed potatoes.  Things to avoid include carbonated beverages (fizzy beverages), raw fruits and raw vegetables, or beans.    If your bowels are filled with gas, your surgeon will have difficulty visualizing your pelvic organs which increases your surgical risks.  Your role in recovery Your role is to become active as soon as directed by your doctor, while still giving yourself time to heal.  Rest when you feel tired. You will be asked to do the following in order to speed your recovery:  - Cough and breathe deeply. This helps toclear and expand your lungs and can prevent pneumonia. You may be given a spirometer to practice deep breathing. A staff  member will show you how to use the spirometer. - Do mild physical activity. Walking or moving your legs help your circulation and body functions return to normal. A staff member will help you when you try to walk and will provide you with simple exercises. Do not try to get up or walk alone the first time. - Actively manage your pain. Managing your pain lets you move in comfort. We will ask you to rate your pain on a scale of zero to 10. It is your responsibility to tell your doctor or nurse where and how much you hurt so your pain can be treated.  Special Considerations -If you are diabetic, you may be placed on insulin after surgery to have closer control over your blood sugars to promote healing and recovery.  This does not mean that you will be discharged on insulin.  If applicable, your oral antidiabetics will be resumed when you are tolerating a solid diet.  -Your final pathology results from surgery should be available by the Friday after surgery and the results will be relayed to you when available.   Blood Transfusion Information WHAT IS A BLOOD TRANSFUSION? A transfusion is the replacement of blood or some of its parts. Blood is made up of multiple cells which provide different functions.  Red blood cells carry oxygen and are used for blood loss replacement.  White blood cells fight against infection.  Platelets control bleeding.  Plasma helps clot blood.  Other blood products are available for specialized needs, such as hemophilia  or other clotting disorders. BEFORE THE TRANSFUSION  Who gives blood for transfusions?   You may be able to donate blood to be used at a later date on yourself (autologous donation).  Relatives can be asked to donate blood. This is generally not any safer than if you have received blood from a stranger. The same precautions are taken to ensure safety when a relative's blood is donated.  Healthy volunteers who are fully evaluated to make sure their  blood is safe. This is blood bank blood. Transfusion therapy is the safest it has ever been in the practice of medicine. Before blood is taken from a donor, a complete history is taken to make sure that person has no history of diseases nor engages in risky social behavior (examples are intravenous drug use or sexual activity with multiple partners). The donor's travel history is screened to minimize risk of transmitting infections, such as malaria. The donated blood is tested for signs of infectious diseases, such as HIV and hepatitis. The blood is then tested to be sure it is compatible with you in order to minimize the chance of a transfusion reaction. If you or a relative donates blood, this is often done in anticipation of surgery and is not appropriate for emergency situations. It takes many days to process the donated blood. RISKS AND COMPLICATIONS Although transfusion therapy is very safe and saves many lives, the main dangers of transfusion include:   Getting an infectious disease.  Developing a transfusion reaction. This is an allergic reaction to something in the blood you were given. Every precaution is taken to prevent this. The decision to have a blood transfusion has been considered carefully by your caregiver before blood is given. Blood is not given unless the benefits outweigh the risks.

## 2016-07-11 NOTE — Progress Notes (Signed)
Consult Note: Gyn-Onc  Consult was requested by Dr. Garwin Brothers for the evaluation of Traci Richardson 83 y.o. female  CC:  Chief Complaint  Patient presents with  . Endometrial Cancer    Assessment/Plan:  Ms. Traci Richardson  is a 83 y.o.  year old with grade 1 endometrial cancer.  A detailed discussion was held with the patient and her family with regard to to her endometrial cancer diagnosis. We discussed the standard management options for uterine cancer which includes surgery followed possibly by adjuvant therapy depending on the results of surgery. The options for surgical management include a hysterectomy and removal of the tubes and ovaries possibly with removal of pelvic and para-aortic lymph nodes.If feasible, a minimally invasive approach including a robotic hysterectomy or laparoscopic hysterectomy have benefits including shorter hospital stay, recovery time and better wound healing than with open surgery. The patient has been counseled about these surgical options and the risks of surgery in general including infection, bleeding, damage to surrounding structures (including bowel, bladder, ureters, nerves or vessels), and the postoperative risks of PE/ DVT, and lymphedema. I extensively reviewed the additional risks of robotic hysterectomy including possible need for conversion to open laparotomy.  I discussed positioning during surgery of trendelenberg and risks of minor facial swelling and care we take in preoperative positioning.  After counseling and consideration of her options, she desires to proceed with robotic assisted total hysterectomy with bilateral sapingo-oophorectomy and SLN biopsy.   She will be seen by anesthesia for preoperative clearance and discussion of postoperative pain management.  She was given the opportunity to ask questions, which were answered to her satisfaction, and she is agreement with the above mentioned plan of care.  I am recommending stopping her ASA now  prior to surgery.   HPI: Traci Richardson is an 83 year old who is seen in consultation at the request of Dr Garwin Brothers for grade 1 endometrial cancer.  The patient has a history of postmenopausal bleeding for 3 months. She was seen by Dr Garwin Brothers on 06/24/16 and [place] was performed which showed a 1.9cm right ovarian complex cyst, a 96mm thickened endometrium with a polyp and a normal uterus.   She went to the OR 09/01/26 and a hysteroscopy and polypectomy was performed. Final pathology showed a grade 1 endometrial cancer with CAH.  She is 83 years of age but of excellent general health. She has had a lap chole and open umbilical hernia repair with mesh. She has had 2 vaginal deliveries. Her daughter has a history of endometrial cancer, but there is no other cancer history in the family.   She takes ASA 81mg  daily.  Current Meds:  Outpatient Encounter Prescriptions as of 07/11/2016  Medication Sig  . acetaminophen (TYLENOL) 500 MG tablet Take 500 mg by mouth daily as needed (for pain (in the afternoon as needed)).  Marland Kitchen aspirin EC 81 MG tablet Take 81 mg by mouth daily after breakfast.  . busPIRone (BUSPAR) 5 MG tablet Take 5 mg by mouth daily after breakfast.  . ezetimibe (ZETIA) 10 MG tablet Take 10 mg by mouth daily after breakfast.  . levothyroxine (SYNTHROID, LEVOTHROID) 100 MCG tablet Take 100 mcg by mouth daily before breakfast. On an empty stomach  . naproxen sodium (ANAPROX) 220 MG tablet Take 440 mg by mouth daily after breakfast.  . Polyethyl Glycol-Propyl Glycol (SYSTANE ULTRA) 0.4-0.3 % SOLN Place 1-2 drops into both eyes daily as needed (for dry eyes).  . simvastatin (ZOCOR) 20 MG tablet  Take 20 mg by mouth daily after breakfast.  . vitamin B-12 (CYANOCOBALAMIN) 500 MCG tablet Take 500 mcg by mouth daily after breakfast.  . Vitamin D, Ergocalciferol, (DRISDOL) 50000 units CAPS capsule Take 50,000 Units by mouth 2 (two) times a week. Tuesday & Thursday after breakfast  . [DISCONTINUED]  misoprostol (CYTOTEC) 200 MCG tablet Place 200 mcg vaginally once.   No facility-administered encounter medications on file as of 07/11/2016.     Allergy:  Allergies  Allergen Reactions  . Sulfonamide Derivatives Hives  . Codeine Other (See Comments)    Passes out  . Tramadol Other (See Comments)    Mood changes including confusion.  . Latex Itching and Rash    On hands w/latex gloves    Social Hx:   Social History   Social History  . Marital status: Widowed    Spouse name: N/A  . Number of children: N/A  . Years of education: N/A   Occupational History  . Not on file.   Social History Main Topics  . Smoking status: Never Smoker  . Smokeless tobacco: Never Used  . Alcohol use No  . Drug use: No  . Sexual activity: No   Other Topics Concern  . Not on file   Social History Narrative  . No narrative on file    Past Surgical Hx:  Past Surgical History:  Procedure Laterality Date  . CATARACT EXTRACTION, BILATERAL    . CHOLECYSTECTOMY     then a repair of wound opening 2 years later  . COLONOSCOPY    . DILATATION & CURETTAGE/HYSTEROSCOPY WITH MYOSURE N/A 07/01/2016   Procedure: DILATATION & CURETTAGE/HYSTEROSCOPY WITH MYOSURE;  Surgeon: Servando Salina, MD;  Location: [facility];  Service: Gynecology;  Laterality: N/A;  . LEFT KNEE SURGERY      Past Medical Hx:  Past Medical History:  Diagnosis Date  . Anxiety   . Arthritis   . Depression   . High cholesterol   . Hypothyroidism   . Pneumonia   . UTI (urinary tract infection)     Past Gynecological History:   No LMP recorded. Patient is postmenopausal.  Family Hx: History reviewed. No pertinent family history.  Review of Systems:  Constitutional  Feels well,    ENT Normal appearing ears and nares bilaterally Skin/Breast  No rash, sores, jaundice, itching, dryness Cardiovascular  No chest pain, shortness of breath, or edema  Pulmonary  No cough or wheeze.  Gastro Intestinal  No nausea,  vomitting, or diarrhoea. No bright red blood per rectum, no abdominal pain, change in bowel movement, or constipation.  Genito Urinary  No frequency, urgency, dysuria, + bleeding Musculo Skeletal  No myalgia, arthralgia, joint swelling or pain  Neurologic  No weakness, numbness, change in gait,  Psychology  No depression, anxiety, insomnia.   Vitals:  Blood pressure 139/76, pulse 79, temperature 97.6 F (36.4 C), temperature source Oral, resp. rate 16, height 6' 0.5" (1.842 m), weight 193 lb 3 oz (87.6 kg), SpO2 98 %.  Physical Exam: WD in NAD Neck  Supple NROM, without any enlargements.  Lymph Node Survey No cervical supraclavicular or inguinal adenopathy Cardiovascular  Pulse normal rate, regularity and rhythm. S1 and S2 normal.  Lungs  Clear to auscultation bilateraly, without wheezes/crackles/rhonchi. Good air movement.  Skin  No rash/lesions/breakdown  Psychiatry  Alert and oriented to person, place, and time  Abdomen  Normoactive bowel sounds, abdomen soft, non-tender and obese without evidence of hernia.  Back No CVA tenderness Genito Urinary  Vulva/vagina: Normal external female genitalia.   No lesions. No discharge or bleeding.  Bladder/urethra:  No lesions or masses, well supported bladder  Vagina: normal  Cervix: Normal appearing, no lesions.  Uterus:  Small, mobile, no parametrial involvement or nodularity.  Adnexa: no masses. Rectal  deferred  Extremities  No bilateral cyanosis, clubbing or edema.   Donaciano Eva, MD  07/11/2016, 12:41 PM

## 2016-07-11 NOTE — Progress Notes (Signed)
07/02/2016-noted in [facility]

## 2016-07-14 ENCOUNTER — Encounter (HOSPITAL_COMMUNITY)
Admission: RE | Admit: 2016-07-14 | Discharge: 2016-07-14 | Disposition: A | Discharge status: Home / Self Care | Payer: Medicare Other | Source: Ambulatory Visit | Attending: Gynecologic Oncology | Admitting: Gynecologic Oncology

## 2016-07-14 ENCOUNTER — Encounter (HOSPITAL_COMMUNITY): Payer: Self-pay

## 2016-07-14 DIAGNOSIS — E039 Hypothyroidism, unspecified: Secondary | ICD-10-CM | POA: Diagnosis not present

## 2016-07-14 DIAGNOSIS — N83202 Unspecified ovarian cyst, left side: Secondary | ICD-10-CM | POA: Diagnosis not present

## 2016-07-14 DIAGNOSIS — C541 Malignant neoplasm of endometrium: Secondary | ICD-10-CM | POA: Diagnosis not present

## 2016-07-14 DIAGNOSIS — Z01818 Encounter for other preprocedural examination: Secondary | ICD-10-CM | POA: Diagnosis not present

## 2016-07-14 DIAGNOSIS — F419 Anxiety disorder, unspecified: Secondary | ICD-10-CM | POA: Diagnosis not present

## 2016-07-14 DIAGNOSIS — F329 Major depressive disorder, single episode, unspecified: Secondary | ICD-10-CM | POA: Diagnosis not present

## 2016-07-14 DIAGNOSIS — Z9049 Acquired absence of other specified parts of digestive tract: Secondary | ICD-10-CM | POA: Diagnosis not present

## 2016-07-14 DIAGNOSIS — E7800 Pure hypercholesterolemia, unspecified: Secondary | ICD-10-CM | POA: Diagnosis not present

## 2016-07-14 DIAGNOSIS — Z882 Allergy status to sulfonamides status: Secondary | ICD-10-CM | POA: Diagnosis not present

## 2016-07-14 DIAGNOSIS — N83201 Unspecified ovarian cyst, right side: Secondary | ICD-10-CM | POA: Diagnosis not present

## 2016-07-14 DIAGNOSIS — Z7982 Long term (current) use of aspirin: Secondary | ICD-10-CM | POA: Diagnosis not present

## 2016-07-14 DIAGNOSIS — Z79899 Other long term (current) drug therapy: Secondary | ICD-10-CM | POA: Diagnosis not present

## 2016-07-14 LAB — URINALYSIS, ROUTINE W REFLEX MICROSCOPIC
Bilirubin Urine: NEGATIVE
Glucose, UA: NEGATIVE mg/dL
Ketones, ur: NEGATIVE mg/dL
Nitrite: NEGATIVE
Protein, ur: 100 mg/dL — AB
Specific Gravity, Urine: 1.02 (ref 1.005–1.030)
pH: 5 (ref 5.0–8.0)

## 2016-07-14 LAB — ABO/RH: ABO/RH(D): O POS

## 2016-07-14 LAB — COMPREHENSIVE METABOLIC PANEL
ALT: 17 U/L (ref 14–54)
AST: 23 U/L (ref 15–41)
Albumin: 3.9 g/dL (ref 3.5–5.0)
Alkaline Phosphatase: 56 U/L (ref 38–126)
Anion gap: 10 (ref 5–15)
BUN: 24 mg/dL — ABNORMAL HIGH (ref 6–20)
CO2: 27 mmol/L (ref 22–32)
Calcium: 9.5 mg/dL (ref 8.9–10.3)
Chloride: 105 mmol/L (ref 101–111)
Creatinine, Ser: 1.03 mg/dL — ABNORMAL HIGH (ref 0.44–1.00)
GFR calc Af Amer: 55 mL/min — ABNORMAL LOW (ref >60)
GFR calc non Af Amer: 47 mL/min — ABNORMAL LOW (ref >60)
Glucose, Bld: 106 mg/dL — ABNORMAL HIGH (ref 65–99)
Potassium: 4.6 mmol/L (ref 3.5–5.1)
Sodium: 142 mmol/L (ref 135–145)
Total Bilirubin: 0.5 mg/dL (ref 0.3–1.2)
Total Protein: 6.8 g/dL (ref 6.5–8.1)

## 2016-07-14 LAB — CBC
HCT: 39.4 % (ref 36.0–46.0)
Hemoglobin: 13.3 g/dL (ref 12.0–15.0)
MCH: 31.7 pg (ref 26.0–34.0)
MCHC: 33.8 g/dL (ref 30.0–36.0)
MCV: 94 fL (ref 78.0–100.0)
Platelets: 177 10*3/uL (ref 150–400)
RBC: 4.19 MIL/uL (ref 3.87–5.11)
RDW: 13.9 % (ref 11.5–15.5)
WBC: 7.6 10*3/uL (ref 4.0–10.5)

## 2016-07-14 NOTE — Progress Notes (Signed)
cmet results routed to [facility] cross np by epic

## 2016-07-15 NOTE — Progress Notes (Signed)
Preliminary urine culture results routed to [facility] cross no epic inbasket

## 2016-07-16 ENCOUNTER — Telehealth: Payer: Self-pay

## 2016-07-16 DIAGNOSIS — N83201 Unspecified ovarian cyst, right side: Secondary | ICD-10-CM | POA: Diagnosis not present

## 2016-07-16 DIAGNOSIS — C541 Malignant neoplasm of endometrium: Secondary | ICD-10-CM | POA: Diagnosis not present

## 2016-07-16 DIAGNOSIS — E7800 Pure hypercholesterolemia, unspecified: Secondary | ICD-10-CM | POA: Diagnosis not present

## 2016-07-16 DIAGNOSIS — N390 Urinary tract infection, site not specified: Secondary | ICD-10-CM

## 2016-07-16 DIAGNOSIS — E039 Hypothyroidism, unspecified: Secondary | ICD-10-CM | POA: Diagnosis not present

## 2016-07-16 DIAGNOSIS — N83202 Unspecified ovarian cyst, left side: Secondary | ICD-10-CM | POA: Diagnosis not present

## 2016-07-16 DIAGNOSIS — F329 Major depressive disorder, single episode, unspecified: Secondary | ICD-10-CM | POA: Diagnosis not present

## 2016-07-16 LAB — URINE CULTURE: Culture: >=100000 — AB

## 2016-07-16 MED ORDER — NITROFURANTOIN MONOHYD MACRO 100 MG PO CAPS: 100.0000 mg | ORAL_CAPSULE | Freq: Two times a day (BID) | ORAL | 0 refills | Status: DC

## 2016-07-16 NOTE — Telephone Encounter (Signed)
Told Ms Bensch that the urine culture from 07-14-16 shows that she has a UTI.  Per Melissa Cross,NP. Macrobid sent in to her pharmacy. Take 2 doses to day and on in the morning prior to her surgery.  Complete the antibiotic when she returns home from the surgery. Pt verbalized understanding.

## 2016-07-17 ENCOUNTER — Ambulatory Visit (HOSPITAL_COMMUNITY): Payer: Medicare Other | Admitting: Anesthesiology

## 2016-07-17 ENCOUNTER — Encounter (HOSPITAL_COMMUNITY)
Admission: RE | Disposition: A | Discharge status: Home / Self Care | Payer: Self-pay | Source: Ambulatory Visit | Attending: Gynecologic Oncology

## 2016-07-17 ENCOUNTER — Encounter (HOSPITAL_COMMUNITY): Payer: Self-pay | Admitting: *Deleted

## 2016-07-17 ENCOUNTER — Ambulatory Visit (HOSPITAL_COMMUNITY)
Admission: RE | Admit: 2016-07-17 | Discharge: 2016-07-18 | Disposition: A | Discharge status: Home / Self Care | Payer: Medicare Other | Source: Ambulatory Visit | Attending: Gynecologic Oncology | Admitting: Gynecologic Oncology

## 2016-07-17 ENCOUNTER — Telehealth: Payer: Self-pay | Admitting: *Deleted

## 2016-07-17 DIAGNOSIS — E7800 Pure hypercholesterolemia, unspecified: Secondary | ICD-10-CM | POA: Diagnosis not present

## 2016-07-17 DIAGNOSIS — N83201 Unspecified ovarian cyst, right side: Secondary | ICD-10-CM | POA: Diagnosis not present

## 2016-07-17 DIAGNOSIS — F329 Major depressive disorder, single episode, unspecified: Secondary | ICD-10-CM | POA: Insufficient documentation

## 2016-07-17 DIAGNOSIS — C542 Malignant neoplasm of myometrium: Secondary | ICD-10-CM | POA: Diagnosis not present

## 2016-07-17 DIAGNOSIS — C541 Malignant neoplasm of endometrium: Secondary | ICD-10-CM | POA: Diagnosis not present

## 2016-07-17 DIAGNOSIS — Z9049 Acquired absence of other specified parts of digestive tract: Secondary | ICD-10-CM | POA: Diagnosis not present

## 2016-07-17 DIAGNOSIS — N736 Female pelvic peritoneal adhesions (postinfective): Secondary | ICD-10-CM | POA: Diagnosis not present

## 2016-07-17 DIAGNOSIS — N83202 Unspecified ovarian cyst, left side: Secondary | ICD-10-CM | POA: Diagnosis not present

## 2016-07-17 DIAGNOSIS — F419 Anxiety disorder, unspecified: Secondary | ICD-10-CM | POA: Insufficient documentation

## 2016-07-17 DIAGNOSIS — Z7982 Long term (current) use of aspirin: Secondary | ICD-10-CM | POA: Diagnosis not present

## 2016-07-17 DIAGNOSIS — Z79899 Other long term (current) drug therapy: Secondary | ICD-10-CM | POA: Insufficient documentation

## 2016-07-17 DIAGNOSIS — Z882 Allergy status to sulfonamides status: Secondary | ICD-10-CM | POA: Insufficient documentation

## 2016-07-17 DIAGNOSIS — I10 Essential (primary) hypertension: Secondary | ICD-10-CM

## 2016-07-17 DIAGNOSIS — E039 Hypothyroidism, unspecified: Secondary | ICD-10-CM | POA: Insufficient documentation

## 2016-07-17 HISTORY — PX: SENTINEL NODE BIOPSY: SHX6608

## 2016-07-17 HISTORY — PX: ROBOTIC ASSISTED TOTAL HYSTERECTOMY WITH BILATERAL SALPINGO OOPHERECTOMY: SHX6086

## 2016-07-17 LAB — TYPE AND SCREEN
ABO/RH(D): O POS
Antibody Screen: NEGATIVE

## 2016-07-17 SURGERY — ROBOTIC ASSISTED TOTAL HYSTERECTOMY WITH BILATERAL SALPINGO OOPHORECTOMY
Anesthesia: General

## 2016-07-17 MED ORDER — FENTANYL CITRATE (PF) 100 MCG/2ML IJ SOLN
INTRAMUSCULAR | Status: DC | PRN
  Administered 2016-07-17 (×5): 50 ug via INTRAVENOUS

## 2016-07-17 MED ORDER — SIMVASTATIN 20 MG PO TABS: 20.0000 mg | ORAL_TABLET | Freq: Every day | ORAL | Status: DC

## 2016-07-17 MED ORDER — LACTATED RINGERS IR SOLN
Status: DC | PRN
  Administered 2016-07-17: 200 mL

## 2016-07-17 MED ORDER — SUGAMMADEX SODIUM 200 MG/2ML IV SOLN
INTRAVENOUS | Status: AC
  Filled 2016-07-17: qty 2

## 2016-07-17 MED ORDER — MEPERIDINE HCL 50 MG/ML IJ SOLN: 6.2500 mg | INTRAMUSCULAR | Status: DC | PRN

## 2016-07-17 MED ORDER — CEFAZOLIN SODIUM-DEXTROSE 2-4 GM/100ML-% IV SOLN
2.0000 g | INTRAVENOUS | Status: AC
  Administered 2016-07-17: 2 g via INTRAVENOUS

## 2016-07-17 MED ORDER — ENOXAPARIN SODIUM 40 MG/0.4ML ~~LOC~~ SOLN
40.0000 mg | SUBCUTANEOUS | Status: AC
  Administered 2016-07-17: 40 mg via SUBCUTANEOUS
  Filled 2016-07-17: qty 0.4

## 2016-07-17 MED ORDER — BUSPIRONE HCL 5 MG PO TABS
5.0000 mg | ORAL_TABLET | Freq: Every day | ORAL | Status: DC
  Administered 2016-07-18: 5 mg via ORAL
  Filled 2016-07-17: qty 1

## 2016-07-17 MED ORDER — FENTANYL CITRATE (PF) 250 MCG/5ML IJ SOLN
INTRAMUSCULAR | Status: AC
  Filled 2016-07-17: qty 5

## 2016-07-17 MED ORDER — IBUPROFEN 200 MG PO TABS
600.0000 mg | ORAL_TABLET | Freq: Three times a day (TID) | ORAL | Status: DC | PRN
  Administered 2016-07-18: 600 mg via ORAL
  Filled 2016-07-17: qty 3

## 2016-07-17 MED ORDER — STERILE WATER FOR INJECTION IJ SOLN
INTRAMUSCULAR | Status: AC
  Filled 2016-07-17: qty 10

## 2016-07-17 MED ORDER — HYDRALAZINE HCL 20 MG/ML IJ SOLN
INTRAMUSCULAR | Status: AC
  Filled 2016-07-17: qty 1

## 2016-07-17 MED ORDER — ONDANSETRON HCL 4 MG PO TABS: 4.0000 mg | ORAL_TABLET | Freq: Four times a day (QID) | ORAL | Status: DC | PRN

## 2016-07-17 MED ORDER — LABETALOL HCL 5 MG/ML IV SOLN
INTRAVENOUS | Status: DC | PRN
  Administered 2016-07-17 (×2): 5 mg via INTRAVENOUS

## 2016-07-17 MED ORDER — PROMETHAZINE HCL 25 MG/ML IJ SOLN: 6.2500 mg | INTRAMUSCULAR | Status: DC | PRN

## 2016-07-17 MED ORDER — LABETALOL HCL 5 MG/ML IV SOLN
INTRAVENOUS | Status: AC
  Filled 2016-07-17: qty 4

## 2016-07-17 MED ORDER — EZETIMIBE 10 MG PO TABS
10.0000 mg | ORAL_TABLET | Freq: Every day | ORAL | Status: DC
  Administered 2016-07-18: 10 mg via ORAL
  Filled 2016-07-17: qty 1

## 2016-07-17 MED ORDER — LIDOCAINE 2% (20 MG/ML) 5 ML SYRINGE
INTRAMUSCULAR | Status: DC | PRN
  Administered 2016-07-17: 50 mg via INTRAVENOUS

## 2016-07-17 MED ORDER — PROPOFOL 10 MG/ML IV BOLUS
INTRAVENOUS | Status: AC
  Filled 2016-07-17: qty 20

## 2016-07-17 MED ORDER — ACETAMINOPHEN 500 MG PO TABS
1000.0000 mg | ORAL_TABLET | Freq: Two times a day (BID) | ORAL | Status: DC
  Administered 2016-07-17 – 2016-07-18 (×2): 1000 mg via ORAL
  Filled 2016-07-17 (×2): qty 2

## 2016-07-17 MED ORDER — STERILE WATER FOR INJECTION IJ SOLN
INTRAMUSCULAR | Status: DC | PRN
  Administered 2016-07-17: 3 mL

## 2016-07-17 MED ORDER — LACTATED RINGERS IV SOLN
INTRAVENOUS | Status: DC | PRN
  Administered 2016-07-17 (×2): via INTRAVENOUS

## 2016-07-17 MED ORDER — HYDROMORPHONE HCL-NACL 0.5-0.9 MG/ML-% IV SOSY
PREFILLED_SYRINGE | INTRAVENOUS | Status: AC
  Filled 2016-07-17: qty 2

## 2016-07-17 MED ORDER — ONDANSETRON HCL 4 MG/2ML IJ SOLN: 4.0000 mg | Freq: Four times a day (QID) | INTRAMUSCULAR | Status: DC | PRN

## 2016-07-17 MED ORDER — ROCURONIUM BROMIDE 50 MG/5ML IV SOSY
PREFILLED_SYRINGE | INTRAVENOUS | Status: DC | PRN
  Administered 2016-07-17: 50 mg via INTRAVENOUS
  Administered 2016-07-17: 20 mg via INTRAVENOUS

## 2016-07-17 MED ORDER — ASPIRIN EC 81 MG PO TBEC
81.0000 mg | DELAYED_RELEASE_TABLET | Freq: Every day | ORAL | Status: DC
  Administered 2016-07-18: 81 mg via ORAL
  Filled 2016-07-17: qty 1

## 2016-07-17 MED ORDER — LEVOTHYROXINE SODIUM 100 MCG PO TABS
100.0000 ug | ORAL_TABLET | Freq: Every day | ORAL | Status: DC
  Administered 2016-07-18: 100 ug via ORAL
  Filled 2016-07-17: qty 1

## 2016-07-17 MED ORDER — NITROFURANTOIN MONOHYD MACRO 100 MG PO CAPS
100.0000 mg | ORAL_CAPSULE | Freq: Two times a day (BID) | ORAL | Status: DC
  Administered 2016-07-17 – 2016-07-18 (×2): 100 mg via ORAL
  Filled 2016-07-17 (×2): qty 1

## 2016-07-17 MED ORDER — PROPOFOL 10 MG/ML IV BOLUS
INTRAVENOUS | Status: DC | PRN
  Administered 2016-07-17: 130 mg via INTRAVENOUS

## 2016-07-17 MED ORDER — HYDRALAZINE HCL 20 MG/ML IJ SOLN
INTRAMUSCULAR | Status: DC | PRN
  Administered 2016-07-17 (×2): 2 mg via INTRAVENOUS

## 2016-07-17 MED ORDER — ONDANSETRON HCL 4 MG/2ML IJ SOLN
INTRAMUSCULAR | Status: AC
  Filled 2016-07-17: qty 2

## 2016-07-17 MED ORDER — GLYCOPYRROLATE 0.2 MG/ML IJ SOLN
INTRAMUSCULAR | Status: DC | PRN
  Administered 2016-07-17: 0.6 mg via INTRAVENOUS

## 2016-07-17 MED ORDER — SUGAMMADEX SODIUM 200 MG/2ML IV SOLN
INTRAVENOUS | Status: DC | PRN
  Administered 2016-07-17: 50 mg via INTRAVENOUS
  Administered 2016-07-17: 150 mg via INTRAVENOUS

## 2016-07-17 MED ORDER — KCL IN DEXTROSE-NACL 20-5-0.45 MEQ/L-%-% IV SOLN
INTRAVENOUS | Status: DC
  Administered 2016-07-17 – 2016-07-18 (×2): via INTRAVENOUS
  Filled 2016-07-17 (×3): qty 1000

## 2016-07-17 MED ORDER — ROCURONIUM BROMIDE 50 MG/5ML IV SOSY
PREFILLED_SYRINGE | INTRAVENOUS | Status: AC
  Filled 2016-07-17: qty 10

## 2016-07-17 MED ORDER — ENOXAPARIN SODIUM 40 MG/0.4ML ~~LOC~~ SOLN
40.0000 mg | SUBCUTANEOUS | Status: DC
  Administered 2016-07-18: 40 mg via SUBCUTANEOUS
  Filled 2016-07-17: qty 0.4

## 2016-07-17 MED ORDER — LIDOCAINE 2% (20 MG/ML) 5 ML SYRINGE
INTRAMUSCULAR | Status: AC
  Filled 2016-07-17: qty 5

## 2016-07-17 MED ORDER — CEFAZOLIN SODIUM-DEXTROSE 2-4 GM/100ML-% IV SOLN
INTRAVENOUS | Status: AC
  Filled 2016-07-17: qty 100

## 2016-07-17 MED ORDER — HYDROMORPHONE HCL 2 MG PO TABS: 2.0000 mg | ORAL_TABLET | ORAL | Status: DC | PRN

## 2016-07-17 MED ORDER — ONDANSETRON HCL 4 MG/2ML IJ SOLN
INTRAMUSCULAR | Status: DC | PRN
  Administered 2016-07-17: 4 mg via INTRAVENOUS

## 2016-07-17 MED ORDER — OXYCODONE HCL 5 MG/5ML PO SOLN
5.0000 mg | Freq: Once | ORAL | Status: DC | PRN
  Filled 2016-07-17: qty 5

## 2016-07-17 MED ORDER — HYDROMORPHONE HCL-NACL 0.5-0.9 MG/ML-% IV SOSY
0.2000 mg | PREFILLED_SYRINGE | INTRAVENOUS | Status: DC | PRN
  Administered 2016-07-18: 0.5 mg via INTRAVENOUS
  Filled 2016-07-17 (×2): qty 1

## 2016-07-17 MED ORDER — OXYCODONE HCL 5 MG PO TABS: 5.0000 mg | ORAL_TABLET | Freq: Once | ORAL | Status: DC | PRN

## 2016-07-17 MED ORDER — HYDROMORPHONE HCL-NACL 0.5-0.9 MG/ML-% IV SOSY
0.2500 mg | PREFILLED_SYRINGE | INTRAVENOUS | Status: DC | PRN
  Administered 2016-07-17 (×2): 0.5 mg via INTRAVENOUS

## 2016-07-17 SURGICAL SUPPLY — 67 items
ADH SKN CLS APL DERMABOND .7 (GAUZE/BANDAGES/DRESSINGS) ×1
AGENT HMST KT MTR STRL THRMB (HEMOSTASIS)
APL ESCP 34 STRL LF DISP (HEMOSTASIS)
APPLICATOR SURGIFLO ENDO (HEMOSTASIS) IMPLANT
BAG LAPAROSCOPIC 12 15 PORT 16 (BASKET) IMPLANT
BAG RETRIEVAL 12/15 (BASKET)
BAG SPEC RTRVL LRG 6X4 10 (ENDOMECHANICALS) ×1
CHLORAPREP W/TINT 26ML (MISCELLANEOUS) ×2 IMPLANT
COVER BACK TABLE 60X90IN (DRAPES) ×2 IMPLANT
COVER SURGICAL LIGHT HANDLE (MISCELLANEOUS) IMPLANT
COVER TIP SHEARS 8 DVNC (MISCELLANEOUS) ×1 IMPLANT
COVER TIP SHEARS 8MM DA VINCI (MISCELLANEOUS) ×1
DERMABOND ADVANCED (GAUZE/BANDAGES/DRESSINGS) ×1
DERMABOND ADVANCED .7 DNX12 (GAUZE/BANDAGES/DRESSINGS) ×1 IMPLANT
DRAPE ARM DVNC X/XI (DISPOSABLE) ×4 IMPLANT
DRAPE COLUMN DVNC XI (DISPOSABLE) ×1 IMPLANT
DRAPE DA VINCI XI ARM (DISPOSABLE) ×4
DRAPE DA VINCI XI COLUMN (DISPOSABLE) ×1
DRAPE SHEET LG 3/4 BI-LAMINATE (DRAPES) ×2 IMPLANT
DRAPE SURG IRRIG POUCH 19X23 (DRAPES) ×2 IMPLANT
ELECT REM PT RETURN 15FT ADLT (MISCELLANEOUS) ×2 IMPLANT
GLOVE BIO SURGEON STRL SZ 6 (GLOVE) IMPLANT
GLOVE BIO SURGEON STRL SZ 6.5 (GLOVE) IMPLANT
GLOVE BIOGEL PI IND STRL 6.5 (GLOVE) ×2 IMPLANT
GLOVE BIOGEL PI INDICATOR 6.5 (GLOVE) ×2
GLOVE SURG SS PI 6.0 STRL IVOR (GLOVE) ×8 IMPLANT
GOWN STRL REUS W/ TWL LRG LVL3 (GOWN DISPOSABLE) ×2 IMPLANT
GOWN STRL REUS W/TWL LRG LVL3 (GOWN DISPOSABLE) ×4
HOLDER FOLEY CATH W/STRAP (MISCELLANEOUS) ×2 IMPLANT
IRRIG SUCT STRYKERFLOW 2 WTIP (MISCELLANEOUS) ×2
IRRIGATION SUCT STRKRFLW 2 WTP (MISCELLANEOUS) ×1 IMPLANT
KIT BASIN OR (CUSTOM PROCEDURE TRAY) IMPLANT
KIT PROCEDURE DA VINCI SI (MISCELLANEOUS) ×1
KIT PROCEDURE DVNC SI (MISCELLANEOUS) ×1 IMPLANT
MANIPULATOR UTERINE 4.5 ZUMI (MISCELLANEOUS) ×2 IMPLANT
MARKER SKIN DUAL TIP RULER LAB (MISCELLANEOUS) IMPLANT
NDL SAFETY ECLIPSE 18X1.5 (NEEDLE) ×1 IMPLANT
NEEDLE HYPO 18GX1.5 SHARP (NEEDLE) ×2
NEEDLE SPNL 18GX3.5 QUINCKE PK (NEEDLE) ×2 IMPLANT
OBTURATOR OPTICAL STANDARD 8MM (TROCAR) ×1
OBTURATOR OPTICAL STND 8 DVNC (TROCAR) ×1
OBTURATOR OPTICALSTD 8 DVNC (TROCAR) ×1 IMPLANT
OCCLUDER COLPOPNEUMO (BALLOONS) IMPLANT
PACK ROBOT GYN CUSTOM WL (TRAY / TRAY PROCEDURE) ×2 IMPLANT
PAD POSITIONING PINK XL (MISCELLANEOUS) ×2 IMPLANT
PORT ACCESS TROCAR AIRSEAL 12 (TROCAR) ×1 IMPLANT
PORT ACCESS TROCAR AIRSEAL 5M (TROCAR) ×1
POUCH SPECIMEN RETRIEVAL 10MM (ENDOMECHANICALS) ×2 IMPLANT
SCISSORS LAP 5X35 DISP (ENDOMECHANICALS) ×2 IMPLANT
SEAL CANN UNIV 5-8 DVNC XI (MISCELLANEOUS) ×4 IMPLANT
SEAL XI 5MM-8MM UNIVERSAL (MISCELLANEOUS) ×4
SET TRI-LUMEN FLTR TB AIRSEAL (TUBING) ×2 IMPLANT
SHEET LAVH (DRAPES) IMPLANT
SOLUTION ELECTROLUBE (MISCELLANEOUS) ×2 IMPLANT
SURGIFLO W/THROMBIN 8M KIT (HEMOSTASIS) IMPLANT
SUT MNCRL AB 4-0 PS2 18 (SUTURE) IMPLANT
SUT VIC AB 0 CT1 27 (SUTURE) ×2
SUT VIC AB 0 CT1 27XBRD ANTBC (SUTURE) ×1 IMPLANT
SYR 10ML LL (SYRINGE) ×2 IMPLANT
SYR 50ML LL SCALE MARK (SYRINGE) IMPLANT
TOWEL OR 17X26 10 PK STRL BLUE (TOWEL DISPOSABLE) IMPLANT
TOWEL OR NON WOVEN STRL DISP B (DISPOSABLE) ×2 IMPLANT
TRAP SPECIMEN MUCOUS 40CC (MISCELLANEOUS) IMPLANT
TRAY FOLEY W/METER SILVER 16FR (SET/KITS/TRAYS/PACK) IMPLANT
TROCAR BLADELESS OPT 5 100 (ENDOMECHANICALS) IMPLANT
UNDERPAD 30X30 (UNDERPADS AND DIAPERS) ×2 IMPLANT
WATER STERILE IRR 1000ML POUR (IV SOLUTION) ×2 IMPLANT

## 2016-07-17 NOTE — H&P (View-Only) (Signed)
Consult Note: Gyn-Onc  Consult was requested by Dr. Garwin Brothers for the evaluation of ELIANY MCCARTER 83 y.o. female  CC:  Chief Complaint  Patient presents with  . Endometrial Cancer    Assessment/Plan:  Ms. ARTINA MINELLA  is a 83 y.o.  year old with grade 1 endometrial cancer.  A detailed discussion was held with the patient and her family with regard to to her endometrial cancer diagnosis. We discussed the standard management options for uterine cancer which includes surgery followed possibly by adjuvant therapy depending on the results of surgery. The options for surgical management include a hysterectomy and removal of the tubes and ovaries possibly with removal of pelvic and para-aortic lymph nodes.If feasible, a minimally invasive approach including a robotic hysterectomy or laparoscopic hysterectomy have benefits including shorter hospital stay, recovery time and better wound healing than with open surgery. The patient has been counseled about these surgical options and the risks of surgery in general including infection, bleeding, damage to surrounding structures (including bowel, bladder, ureters, nerves or vessels), and the postoperative risks of PE/ DVT, and lymphedema. I extensively reviewed the additional risks of robotic hysterectomy including possible need for conversion to open laparotomy.  I discussed positioning during surgery of trendelenberg and risks of minor facial swelling and care we take in preoperative positioning.  After counseling and consideration of her options, she desires to proceed with robotic assisted total hysterectomy with bilateral sapingo-oophorectomy and SLN biopsy.   She will be seen by anesthesia for preoperative clearance and discussion of postoperative pain management.  She was given the opportunity to ask questions, which were answered to her satisfaction, and she is agreement with the above mentioned plan of care.  I am recommending stopping her ASA now  prior to surgery.   HPI: Koralyn Prestage is an 83 year old who is seen in consultation at the request of Dr Garwin Brothers for grade 1 endometrial cancer.  The patient has a history of postmenopausal bleeding for 3 months. She was seen by Dr Garwin Brothers on 06/24/16 and [place] was performed which showed a 1.9cm right ovarian complex cyst, a 70mm thickened endometrium with a polyp and a normal uterus.   She went to the OR 09/01/26 and a hysteroscopy and polypectomy was performed. Final pathology showed a grade 1 endometrial cancer with CAH.  She is 83 years of age but of excellent general health. She has had a lap chole and open umbilical hernia repair with mesh. She has had 2 vaginal deliveries. Her daughter has a history of endometrial cancer, but there is no other cancer history in the family.   She takes ASA 81mg  daily.  Current Meds:  Outpatient Encounter Prescriptions as of 07/11/2016  Medication Sig  . acetaminophen (TYLENOL) 500 MG tablet Take 500 mg by mouth daily as needed (for pain (in the afternoon as needed)).  Marland Kitchen aspirin EC 81 MG tablet Take 81 mg by mouth daily after breakfast.  . busPIRone (BUSPAR) 5 MG tablet Take 5 mg by mouth daily after breakfast.  . ezetimibe (ZETIA) 10 MG tablet Take 10 mg by mouth daily after breakfast.  . levothyroxine (SYNTHROID, LEVOTHROID) 100 MCG tablet Take 100 mcg by mouth daily before breakfast. On an empty stomach  . naproxen sodium (ANAPROX) 220 MG tablet Take 440 mg by mouth daily after breakfast.  . Polyethyl Glycol-Propyl Glycol (SYSTANE ULTRA) 0.4-0.3 % SOLN Place 1-2 drops into both eyes daily as needed (for dry eyes).  . simvastatin (ZOCOR) 20 MG tablet  Take 20 mg by mouth daily after breakfast.  . vitamin B-12 (CYANOCOBALAMIN) 500 MCG tablet Take 500 mcg by mouth daily after breakfast.  . Vitamin D, Ergocalciferol, (DRISDOL) 50000 units CAPS capsule Take 50,000 Units by mouth 2 (two) times a week. Tuesday & Thursday after breakfast  . [DISCONTINUED]  misoprostol (CYTOTEC) 200 MCG tablet Place 200 mcg vaginally once.   No facility-administered encounter medications on file as of 07/11/2016.     Allergy:  Allergies  Allergen Reactions  . Sulfonamide Derivatives Hives  . Codeine Other (See Comments)    Passes out  . Tramadol Other (See Comments)    Mood changes including confusion.  . Latex Itching and Rash    On hands w/latex gloves    Social Hx:   Social History   Social History  . Marital status: Widowed    Spouse name: N/A  . Number of children: N/A  . Years of education: N/A   Occupational History  . Not on file.   Social History Main Topics  . Smoking status: Never Smoker  . Smokeless tobacco: Never Used  . Alcohol use No  . Drug use: No  . Sexual activity: No   Other Topics Concern  . Not on file   Social History Narrative  . No narrative on file    Past Surgical Hx:  Past Surgical History:  Procedure Laterality Date  . CATARACT EXTRACTION, BILATERAL    . CHOLECYSTECTOMY     then a repair of wound opening 2 years later  . COLONOSCOPY    . DILATATION & CURETTAGE/HYSTEROSCOPY WITH MYOSURE N/A 07/01/2016   Procedure: DILATATION & CURETTAGE/HYSTEROSCOPY WITH MYOSURE;  Surgeon: Servando Salina, MD;  Location: [facility];  Service: Gynecology;  Laterality: N/A;  . LEFT KNEE SURGERY      Past Medical Hx:  Past Medical History:  Diagnosis Date  . Anxiety   . Arthritis   . Depression   . High cholesterol   . Hypothyroidism   . Pneumonia   . UTI (urinary tract infection)     Past Gynecological History:   No LMP recorded. Patient is postmenopausal.  Family Hx: History reviewed. No pertinent family history.  Review of Systems:  Constitutional  Feels well,    ENT Normal appearing ears and nares bilaterally Skin/Breast  No rash, sores, jaundice, itching, dryness Cardiovascular  No chest pain, shortness of breath, or edema  Pulmonary  No cough or wheeze.  Gastro Intestinal  No nausea,  vomitting, or diarrhoea. No bright red blood per rectum, no abdominal pain, change in bowel movement, or constipation.  Genito Urinary  No frequency, urgency, dysuria, + bleeding Musculo Skeletal  No myalgia, arthralgia, joint swelling or pain  Neurologic  No weakness, numbness, change in gait,  Psychology  No depression, anxiety, insomnia.   Vitals:  Blood pressure 139/76, pulse 79, temperature 97.6 F (36.4 C), temperature source Oral, resp. rate 16, height 6' 0.5" (1.842 m), weight 193 lb 3 oz (87.6 kg), SpO2 98 %.  Physical Exam: WD in NAD Neck  Supple NROM, without any enlargements.  Lymph Node Survey No cervical supraclavicular or inguinal adenopathy Cardiovascular  Pulse normal rate, regularity and rhythm. S1 and S2 normal.  Lungs  Clear to auscultation bilateraly, without wheezes/crackles/rhonchi. Good air movement.  Skin  No rash/lesions/breakdown  Psychiatry  Alert and oriented to person, place, and time  Abdomen  Normoactive bowel sounds, abdomen soft, non-tender and obese without evidence of hernia.  Back No CVA tenderness Genito Urinary  Vulva/vagina: Normal external female genitalia.   No lesions. No discharge or bleeding.  Bladder/urethra:  No lesions or masses, well supported bladder  Vagina: normal  Cervix: Normal appearing, no lesions.  Uterus:  Small, mobile, no parametrial involvement or nodularity.  Adnexa: no masses. Rectal  deferred  Extremities  No bilateral cyanosis, clubbing or edema.   Donaciano Eva, MD  07/11/2016, 12:41 PM

## 2016-07-17 NOTE — Op Note (Signed)
OPERATIVE NOTE 07/17/16  Surgeon: Donaciano Eva   Assistants: Dr Lahoma Crocker (an MD assistant was necessary for tissue manipulation, management of robotic instrumentation, retraction and positioning due to the complexity of the case and hospital policies).   Anesthesia: General endotracheal anesthesia  ASA Class: 3   Pre-operative Diagnosis: grade 1 endometrial cancer  Post-operative Diagnosis: same  Operation: Robotic-assisted laparoscopic total hysterectomy with bilateral salpingoophorectomy, SLN mapping and lymph node biopsy, lysis of adhesions  Surgeon: Donaciano Eva  Assistant Surgeon: Lahoma Crocker MD  Anesthesia: GET  Urine Output: 300  Operative Findings:  : 6cm uterus, grossly normal, and normal tube and ovaries. SIgnificant visceral adiposity and redundant small intestine overlying the right pelvic side wall preventing lymph node dissection on the right. Adhesions between omentum and umbilical hernia mesh.  Estimated Blood Loss:  <25      Total IV Fluids: 900 ml         Specimens: uterus, cervix, bilateral tubes and ovaries, left obturator SLN         Complications:  None; patient tolerated the procedure well.         Disposition: PACU - hemodynamically stable.  Procedure Details  The patient was seen in the Holding Room. The risks, benefits, complications, treatment options, and expected outcomes were discussed with the patient.  The patient concurred with the proposed plan, giving informed consent.  The site of surgery properly noted/marked. The patient was identified as Traci Richardson and the procedure verified as a Robotic-assisted hysterectomy with bilateral salpingo oophorectomy with SLN biopsy A Time Out was held and the above information confirmed.  After induction of anesthesia, the patient was draped and prepped in the usual sterile manner. Pt was placed in supine position after anesthesia and draped and prepped in the usual sterile  manner. The abdominal drape was placed after the CholoraPrep had been allowed to dry for 3 minutes.  Her arms were tucked to her side with all appropriate precautions.  The shoulders were stabilized with padded shoulder blocks applied to the acromium processes.  The patient was placed in the semi-lithotomy position in [place].  The perineum was prepped with Betadine. The patient was then prepped. Foley catheter was placed.  A sterile speculum was placed in the vagina.  The cervix was grasped with a single-tooth tenaculum and dilated with Kennon Rounds dilators.  1mg  total of ICG was injected into the cervical stroma at 2 and 9 o'clock at a 78mm depth (concentration 0..5mg /ml). The ZUMI uterine manipulator with a medium colpotomizer ring was placed without difficulty.  A pneum occluder balloon was placed over the manipulator.  OG tube placement was confirmed and to suction.   Next, a 5 mm skin incision was made 1 cm below the subcostal margin in the midclavicular line.  The 5 mm Optiview port and scope was used for direct entry.  Opening pressure was under 10 mm CO2.  The abdomen was insufflated and the findings were noted as above.   At this point and all points during the procedure, the patient's intra-abdominal pressure did not exceed 15 mmHg. Next, a 10 mm skin incision was made 2cm above the umbilicus and a right and left port was placed about 10 cm lateral to the robot port on the right and left side.  A fourth arm was placed in the left lower quadrant 2 cm above and superior and medial to the anterior superior iliac spine.  All ports were placed under direct visualization.  For  28minutes sharp adhesiolysis with laparoscopic scissors was performed to take the omental adhesions down from the anterior abdominal wall.  The patient was placed in steep Trendelenburg.  Bowel was folded away into the upper abdomen however on the right, the redundant loops of ileum remained draped over the right IP ligament and  sidewall.  The robot was docked in the normal manner.  The right and left peritoneum were opened parallel to the IP ligament to open the retroperitoneal spaces bilaterally. The SLN mapping was performed in bilateral pelvic basins. The para rectal and paravesical spaces were opened up. Lymphatic channels were identified travelling to the following visualized sentinel lymph node's: left obturator SLN. A left sided dissection was not performed due to unsafe visualization secondary to patient adiposity and redundant bowel. This SLN was separated from their surrounding lymphatic tissue, removed and sent for permanent pathology.  The hysterectomy was started after the round ligament on the right side was incised and the retroperitoneum was entered and the pararectal space was developed.  The ureter was noted to be on the medial leaf of the broad ligament.  The peritoneum above the ureter was incised and stretched and the infundibulopelvic ligament was skeletonized, cauterized and cut.  The posterior peritoneum was taken down to the level of the KOH ring.  The anterior peritoneum was also taken down.  The bladder flap was created to the level of the KOH ring.  The uterine artery on the right side was skeletonized, cauterized and cut in the normal manner.  A similar procedure was performed on the left.  The colpotomy was made and the uterus, cervix, bilateral ovaries and tubes were amputated and delivered through the vagina.  Pedicles were inspected and excellent hemostasis was achieved.    The colpotomy at the vaginal cuff was closed with Vicryl on a CT1 needle in a figure of 8 manner.  Irrigation was used and excellent hemostasis was achieved.  At this point in the procedure was completed.  Robotic instruments were removed under direct visulaization.  The robot was undocked. The 10 mm ports were closed with Vicryl on a UR-5 needle and the fascia was closed with 0 Vicryl on a UR-5 needle.  The skin was closed with  4-0 Vicryl in a subcuticular manner.  Dermabond was applied.  Sponge, lap and needle counts correct x 2.  The patient was taken to the recovery room in stable condition.  The vagina was swabbed with  minimal bleeding noted.   All instrument and needle counts were correct x  3.   The patient was transferred to the recovery room in stable condition.  Donaciano Eva, MD

## 2016-07-17 NOTE — Telephone Encounter (Signed)
Per staff message I have scheduled post op appt for the patient. Patient will receive at on the discharge summary

## 2016-07-17 NOTE — Anesthesia Procedure Notes (Signed)
Procedure Name: Intubation Date/Time: 07/17/2016 7:43 AM Performed by: Deliah Boston Pre-anesthesia Checklist: Patient identified, Emergency Drugs available, Suction available and Patient being monitored Patient Re-evaluated:Patient Re-evaluated prior to induction Oxygen Delivery Method: Circle system utilized Preoxygenation: Pre-oxygenation with 100% oxygen Induction Type: IV induction Ventilation: Mask ventilation without difficulty Laryngoscope Size: Mac and 3 Grade View: Grade I Tube type: Oral Tube size: 7.0 mm Number of attempts: 1 Airway Equipment and Method: Stylet and Oral airway Placement Confirmation: ETT inserted through vocal cords under direct vision,  positive ETCO2 and breath sounds checked- equal and bilateral Secured at: 21 (at teeth) cm Tube secured with: Tape Dental Injury: Teeth and Oropharynx as per pre-operative assessment

## 2016-07-17 NOTE — Interval H&P Note (Signed)
History and Physical Interval Note:  07/17/2016 7:14 AM  Traci Richardson  has presented today for surgery, with the diagnosis of [facility] An CANCER  The various methods of treatment have been discussed with the patient and family. After consideration of risks, benefits and other options for treatment, the patient has consented to  Procedure(s): ROBOTIC ASSISTED TOTAL HYSTERECTOMY WITH BILATERAL SALPINGO OOPHORECTOMY (N/A) SENTINEL NODE BIOPSY (N/A) as a surgical intervention .  The patient's history has been reviewed, patient examined, no change in status, stable for surgery.  I have reviewed the patient's chart and labs.  Questions were answered to the patient's satisfaction.     Donaciano Eva

## 2016-07-17 NOTE — Anesthesia Postprocedure Evaluation (Signed)
Anesthesia Post Note  Patient: Traci Richardson  Procedure(s) Performed: Procedure(s) (LRB): ROBOTIC ASSISTED TOTAL HYSTERECTOMY WITH BILATERAL SALPINGO OOPHORECTOMY AND LYSIS OF ADHESIONS (N/A) SENTINEL NODE BIOPSY (N/A)     Patient location during evaluation: PACU Anesthesia Type: General Level of consciousness: awake and alert Pain management: pain level controlled Vital Signs Assessment: post-procedure vital signs reviewed and stable Respiratory status: spontaneous breathing, nonlabored ventilation and respiratory function stable Cardiovascular status: blood pressure returned to baseline and stable Postop Assessment: no signs of nausea or vomiting Anesthetic complications: no    Last Vitals:  Vitals:   07/17/16 1045 07/17/16 1100  BP:  (!) 162/88  Pulse:  64  Resp:  12  Temp: (!) 36.3 C 36.6 C    Last Pain:  Vitals:   07/17/16 1045  TempSrc:   PainSc: 3                  Lynda Rainwater

## 2016-07-17 NOTE — Transfer of Care (Signed)
Immediate Anesthesia Transfer of Care Note  Patient: Traci Richardson  Procedure(s) Performed: Procedure(s): ROBOTIC ASSISTED TOTAL HYSTERECTOMY WITH BILATERAL SALPINGO OOPHORECTOMY AND LYSIS OF ADHESIONS (N/A) SENTINEL NODE BIOPSY (N/A)  Patient Location: PACU  Anesthesia Type:General  Level of Consciousness: Patient easily awoken, sedated, comfortable, cooperative, following commands, responds to stimulation.   Airway & Oxygen Therapy: Patient spontaneously breathing, ventilating well, oxygen via simple oxygen mask.  Post-op Assessment: Report given to PACU RN, vital signs reviewed and stable, moving all extremities.   Post vital signs: Reviewed and stable.  Complications: No apparent anesthesia complications  Last Vitals:  Vitals:   07/17/16 0541  BP: 133/87  Pulse: 77  Resp: 18  Temp: 36.7 C    Last Pain:  Vitals:   07/17/16 0541  TempSrc: Oral         Complications: No apparent anesthesia complications

## 2016-07-17 NOTE — Anesthesia Preprocedure Evaluation (Signed)
Anesthesia Evaluation  Patient identified by MRN, date of birth, ID band Patient awake    Reviewed: Allergy & Precautions, NPO status , Patient's Chart, lab work & pertinent test results  Airway Mallampati: II  TM Distance: >3 FB Neck ROM: Full    Dental  (+) Dental Advisory Given   Pulmonary neg pulmonary ROS,    breath sounds clear to auscultation       Cardiovascular negative cardio ROS   Rhythm:Regular Rate:Normal     Neuro/Psych Anxiety Depression negative neurological ROS     GI/Hepatic negative GI ROS, Neg liver ROS,   Endo/Other  Hypothyroidism   Renal/GU negative Renal ROS     Musculoskeletal  (+) Arthritis ,   Abdominal   Peds  Hematology negative hematology ROS (+)   Anesthesia Other Findings Endometrial Cancer  Reproductive/Obstetrics                             Lab Results  Component Value Date   WBC 7.6 07/14/2016   HGB 13.3 07/14/2016   HCT 39.4 07/14/2016   MCV 94.0 07/14/2016   PLT 177 07/14/2016   Lab Results  Component Value Date   CREATININE 1.03 (H) 07/14/2016   BUN 24 (H) 07/14/2016   NA 142 07/14/2016   K 4.6 07/14/2016   CL 105 07/14/2016   CO2 27 07/14/2016    Anesthesia Physical  Anesthesia Plan  ASA: III  Anesthesia Plan: General   Post-op Pain Management:    Induction: Intravenous  PONV Risk Score and Plan: 4 or greater and Ondansetron, Dexamethasone, Propofol and Treatment may vary due to age or medical condition  Airway Management Planned: Oral ETT  Additional Equipment:   Intra-op Plan:   Post-operative Plan: Extubation in OR  Informed Consent: I have reviewed the patients History and Physical, chart, labs and discussed the procedure including the risks, benefits and alternatives for the proposed anesthesia with the patient or authorized representative who has indicated his/her understanding and acceptance.   Dental advisory  given  Plan Discussed with: CRNA  Anesthesia Plan Comments:         Anesthesia Quick Evaluation

## 2016-07-18 DIAGNOSIS — C541 Malignant neoplasm of endometrium: Secondary | ICD-10-CM | POA: Diagnosis not present

## 2016-07-18 DIAGNOSIS — N83202 Unspecified ovarian cyst, left side: Secondary | ICD-10-CM | POA: Diagnosis not present

## 2016-07-18 DIAGNOSIS — E039 Hypothyroidism, unspecified: Secondary | ICD-10-CM | POA: Diagnosis not present

## 2016-07-18 DIAGNOSIS — E7800 Pure hypercholesterolemia, unspecified: Secondary | ICD-10-CM | POA: Diagnosis not present

## 2016-07-18 DIAGNOSIS — N83201 Unspecified ovarian cyst, right side: Secondary | ICD-10-CM | POA: Diagnosis not present

## 2016-07-18 DIAGNOSIS — F329 Major depressive disorder, single episode, unspecified: Secondary | ICD-10-CM | POA: Diagnosis not present

## 2016-07-18 LAB — BASIC METABOLIC PANEL
Anion gap: 7 (ref 5–15)
BUN: 11 mg/dL (ref 6–20)
CO2: 29 mmol/L (ref 22–32)
Calcium: 8.9 mg/dL (ref 8.9–10.3)
Chloride: 102 mmol/L (ref 101–111)
Creatinine, Ser: 0.97 mg/dL (ref 0.44–1.00)
GFR calc Af Amer: 59 mL/min — ABNORMAL LOW (ref >60)
GFR calc non Af Amer: 51 mL/min — ABNORMAL LOW (ref >60)
Glucose, Bld: 128 mg/dL — ABNORMAL HIGH (ref 65–99)
Potassium: 4.2 mmol/L (ref 3.5–5.1)
Sodium: 138 mmol/L (ref 135–145)

## 2016-07-18 LAB — CBC
HCT: 39 % (ref 36.0–46.0)
Hemoglobin: 12.8 g/dL (ref 12.0–15.0)
MCH: 30.6 pg (ref 26.0–34.0)
MCHC: 32.8 g/dL (ref 30.0–36.0)
MCV: 93.3 fL (ref 78.0–100.0)
Platelets: 176 10*3/uL (ref 150–400)
RBC: 4.18 MIL/uL (ref 3.87–5.11)
RDW: 14 % (ref 11.5–15.5)
WBC: 9.9 10*3/uL (ref 4.0–10.5)

## 2016-07-18 MED ORDER — LABETALOL HCL 5 MG/ML IV SOLN
10.0000 mg | Freq: Once | INTRAVENOUS | Status: AC
  Administered 2016-07-18: 10 mg via INTRAVENOUS
  Filled 2016-07-18 (×2): qty 4

## 2016-07-18 MED ORDER — TRAMADOL HCL 50 MG PO TABS: 50.0000 mg | ORAL_TABLET | Freq: Four times a day (QID) | ORAL | 0 refills | Status: DC | PRN

## 2016-07-18 NOTE — Progress Notes (Signed)
Nurse tech Barbie Banner reported to this RN that pt's BP was elevated @ 185/99, puls 66. Spoke with Joylene John, NP, re: pt condition and plan established was to recheck BP and pulse and call her w/findings and give labetalol if needed. BP was rechecked and had gone down but was still elevated. Notified Melissa and decision was to recheck after giving prn pain med since pt was in pain and call w/results. BP rechecked and was similar to previous check. Notified Melissa and she said to give the labetalol and recheck BP in an hour. Administered med and rechecked BP/pulse. Notified Melissa of results and she gave the ok to discharge pt. See next progress note for further information.

## 2016-07-18 NOTE — Progress Notes (Signed)
Reviewed AVS and discharge summary with pt and family. They verbalized understanding and questions were answered to their satisfaction. Pt discharged home with family and all belongings in stable condition with pain controlled via [employer].

## 2016-07-18 NOTE — Discharge Instructions (Signed)
07/17/2016  Return to work: 4 weeks  Activity: 1. Be up and out of the bed during the day.  Take a nap if needed.  You may walk up steps but be careful and use the hand rail.  Stair climbing will tire you more than you think, you may need to stop part way and rest.   2. No lifting or straining for 6 weeks.  3. No driving for 1 weeks.  Do Not drive if you are taking narcotic pain medicine.  4. Shower daily.  Use soap and water on your incision and pat dry; don't rub.   5. No sexual activity and nothing in the vagina for 8 weeks.  Medications:  - Take ibuprofen and tylenol first line for pain control. Take these regularly (every 6 hours) to decrease the build up of pain.  - If necessary, for severe pain not relieved by ibuprofen, take percocet.  - While taking percocet you should take sennakot every night to reduce the likelihood of constipation. If this causes diarrhea, stop its use.  Diet: 1. Low sodium Heart Healthy Diet is recommended.  2. It is safe to use a laxative if you have difficulty moving your bowels.   Wound Care: 1. Keep clean and dry.  Shower daily.  Reasons to call the Doctor:   Fever - Oral temperature greater than 100.4 degrees Fahrenheit  Foul-smelling vaginal discharge  Difficulty urinating  Nausea and vomiting  Increased pain at the site of the incision that is unrelieved with pain medicine.  Difficulty breathing with or without chest pain  New calf pain especially if only on one side  Sudden, continuing increased vaginal bleeding with or without clots.   Follow-up: 1. See Everitt Amber in 3 weeks.  Contacts: For questions or concerns you should contact:  Dr. Everitt Amber at [phone] After hours and on week-ends call [phone] and ask to speak to the physician on call for Gynecologic Oncology   Tramadol tablets What is this medicine? TRAMADOL (TRA ma dole) is a pain reliever. It is used to treat moderate to severe pain in adults. This  medicine may be used for other purposes; ask your health care provider or pharmacist if you have questions. COMMON BRAND NAME(S): Ultram What should I tell my health care provider before I take this medicine? They need to know if you have any of these conditions: -brain tumor -depression -drug abuse or addiction -head injury -if you frequently drink alcohol containing drinks -kidney disease or trouble passing urine -liver disease -lung disease, asthma, or breathing problems -seizures or epilepsy -suicidal thoughts, plans, or attempt; a previous suicide attempt by you or a family member -an unusual or allergic reaction to tramadol, codeine, other medicines, foods, dyes, or preservatives -pregnant or trying to get pregnant -breast-feeding How should I use this medicine? Take this medicine by mouth with a full glass of water. Follow the directions on the prescription label. You can take it with or without food. If it upsets your stomach, take it with food. Do not take your medicine more often than directed. A special MedGuide will be given to you by the pharmacist with each prescription and refill. Be sure to read this information carefully each time. Talk to your pediatrician regarding the use of this medicine in children. Special care may be needed. Overdosage: If you think you have taken too much of this medicine contact a poison control center or emergency room at once. NOTE: This medicine is only for  you. Do not share this medicine with others. What if I miss a dose? If you miss a dose, take it as soon as you can. If it is almost time for your next dose, take only that dose. Do not take double or extra doses. What may interact with this medicine? Do not take this medication with any of the following medicines: -MAOIs like Carbex, Eldepryl, Marplan, Nardil, and Parnate This medicine may also interact with the following medications: -alcohol -antihistamines for allergy, cough and  cold -certain medicines for anxiety or sleep -certain medicines for depression like amitriptyline, fluoxetine, sertraline -certain medicines for migraine headache like almotriptan, eletriptan, frovatriptan, naratriptan, rizatriptan, sumatriptan, zolmitriptan -certain medicines for seizures like carbamazepine, oxcarbazepine, phenobarbital, primidone -certain medicines that treat or prevent blood clots like warfarin -digoxin -furazolidone -general anesthetics like halothane, isoflurane, methoxyflurane, propofol -linezolid -local anesthetics like lidocaine, pramoxine, tetracaine -medicines that relax muscles for surgery -other narcotic medicines for pain or cough -phenothiazines like chlorpromazine, mesoridazine, prochlorperazine, thioridazine -procarbazine This list may not describe all possible interactions. Give your health care provider a list of all the medicines, herbs, non-prescription drugs, or dietary supplements you use. Also tell them if you smoke, drink alcohol, or use illegal drugs. Some items may interact with your medicine. What should I watch for while using this medicine? Tell your doctor or health care professional if your pain does not go away, if it gets worse, or if you have new or a different type of pain. You may develop tolerance to the medicine. Tolerance means that you will need a higher dose of the medicine for pain relief. Tolerance is normal and is expected if you take this medicine for a long time. Do not suddenly stop taking your medicine because you may develop a severe reaction. Your body becomes used to the medicine. This does NOT mean you are addicted. Addiction is a behavior related to getting and using a drug for a non-medical reason. If you have pain, you have a medical reason to take pain medicine. Your doctor will tell you how much medicine to take. If your doctor wants you to stop the medicine, the dose will be slowly lowered over time to avoid any side  effects. There are different types of narcotic medicines (opiates). If you take more than one type at the same time or if you are taking another medicine that also causes drowsiness, you may have more side effects. Give your health care provider a list of all medicines you use. Your doctor will tell you how much medicine to take. Do not take more medicine than directed. Call emergency for help if you have problems breathing or unusual sleepiness. You may get drowsy or dizzy. Do not drive, use machinery, or do anything that needs mental alertness until you know how this medicine affects you. Do not stand or sit up quickly, especially if you are an older patient. This reduces the risk of dizzy or fainting spells. Alcohol can increase or decrease the effects of this medicine. Avoid alcoholic drinks. You may have constipation. Try to have a bowel movement at least every 2 to 3 days. If you do not have a bowel movement for 3 days, call your doctor or health care professional. Your mouth may get dry. Chewing sugarless gum or sucking hard candy, and drinking plenty of water may help. Contact your doctor if the problem does not go away or is severe. What side effects may I notice from receiving this medicine? Side effects that you  should report to your doctor or health care professional as soon as possible: -allergic reactions like skin rash, itching or hives, swelling of the face, lips, or tongue -breathing problems -confusion -seizures -signs and symptoms of low blood pressure like dizziness; feeling faint or lightheaded, falls; unusually weak or tired -trouble passing urine or change in the amount of urine Side effects that usually do not require medical attention (report to your doctor or health care professional if they continue or are bothersome): -constipation -dry mouth -nausea, vomiting -tiredness This list may not describe all possible side effects. Call your doctor for medical advice about side  effects. You may report side effects to FDA at 1-800-FDA-1088. Where should I keep my medicine? Keep out of the reach of children. This medicine may cause accidental overdose and death if it taken by other adults, children, or pets. Mix any unused medicine with a substance like cat litter or coffee grounds. Then throw the medicine away in a sealed container like a sealed bag or a coffee can with a lid. Do not use the medicine after the expiration date. Store at room temperature between 15 and 30 degrees C (59 and 86 degrees F). NOTE: This sheet is a summary. It may not cover all possible information. If you have questions about this medicine, talk to your doctor, pharmacist, or health care provider.  2018 Elsevier/Gold Standard (2014-09-17 09:00:04)

## 2016-07-18 NOTE — Discharge Summary (Signed)
Physician Discharge Summary  Patient ID: Traci Richardson MRN: [id number] DOB/AGE: January 14, 1925 83 y.o.  Admit date: 07/17/2016 Discharge date: 07/18/2016  Admission Diagnoses: Endometrial adenocarcinoma [facility])  Discharge Diagnoses:  Principal Problem:   Endometrial adenocarcinoma [facility]) Active Problems:   Endometrial cancer [facility])   Discharged Condition:  The patient is in good condition and stable for discharge.   Hospital Course: On 07/17/2016, the patient underwent the following: Procedure(s): ROBOTIC ASSISTED TOTAL HYSTERECTOMY WITH BILATERAL SALPINGO OOPHORECTOMY AND LYSIS OF ADHESIONS SENTINEL NODE BIOPSY.   The postoperative course was uneventful.  She was discharged to home on postoperative day 1 tolerating a regular diet, voiding, ambulating, pain controlled, passing flatus.  One time dose of labetolol IV given per Dr. Delsa Sale for elevated BP of 185/99.  BP decreased and remained stable.  Consults: None  Significant Diagnostic Studies: None  Treatments: surgery: see above  Discharge Exam: Blood pressure (!) 147/88, pulse 73, temperature 98.7 F (37.1 C), temperature source Oral, resp. rate 20, height 5' (1.524 m), weight 189 lb (85.7 kg), SpO2 97 %. General appearance: alert, cooperative and no distress Resp: clear to auscultation bilaterally Cardio: regular rate and rhythm, S1, S2 normal, no murmur, click, rub or gallop GI: soft, non-tender; bowel sounds normal; no masses,  no organomegaly Extremities: extremities normal, atraumatic, no cyanosis or edema Incision/Wound: Lap sites to the abdomen with dermabond without erythema or drainage  Disposition: 01-Home or Self Care  Discharge Instructions    Call MD for:  difficulty breathing, headache or visual disturbances    Complete by:  As directed    Call MD for:  extreme fatigue    Complete by:  As directed    Call MD for:  hives    Complete by:  As directed    Call MD for:  persistant dizziness or light-headedness     Complete by:  As directed    Call MD for:  persistant nausea and vomiting    Complete by:  As directed    Call MD for:  redness, tenderness, or signs of infection (pain, swelling, redness, odor or green/yellow discharge around incision site)    Complete by:  As directed    Call MD for:  severe uncontrolled pain    Complete by:  As directed    Call MD for:  temperature >100.4    Complete by:  As directed    Diet - low sodium heart healthy    Complete by:  As directed    Driving Restrictions    Complete by:  As directed    No driving for 1 week.  Do not take narcotics and drive.   Increase activity slowly    Complete by:  As directed    Lifting restrictions    Complete by:  As directed    No lifting greater than 10 lbs.   Sexual Activity Restrictions    Complete by:  As directed    No sexual activity, nothing in the vagina, for 8 weeks.     Allergies as of 07/18/2016      Reactions   Sulfonamide Derivatives Hives   Codeine Other (See Comments)   Passes out   Tramadol Other (See Comments)   Mood changes including confusion.   Latex Itching, Rash   On hands w/latex gloves      Medication List    TAKE these medications   acetaminophen 500 MG tablet Commonly known as:  TYLENOL Take 500 mg by mouth daily as needed (for pain (in  the afternoon as needed)).   aspirin EC 81 MG tablet Take 81 mg by mouth daily after breakfast.   busPIRone 5 MG tablet Commonly known as:  BUSPAR Take 5 mg by mouth daily after breakfast.   ezetimibe 10 MG tablet Commonly known as:  ZETIA Take 10 mg by mouth daily after breakfast.   levothyroxine 100 MCG tablet Commonly known as:  SYNTHROID, LEVOTHROID Take 100 mcg by mouth daily before breakfast. On an empty stomach   naproxen sodium 220 MG tablet Commonly known as:  ANAPROX Take 440 mg by mouth daily after breakfast.   nitrofurantoin (macrocrystal-monohydrate) 100 MG capsule Commonly known as:  MACROBID Take 1 capsule (100 mg total)  by mouth 2 (two) times daily.   simvastatin 20 MG tablet Commonly known as:  ZOCOR Take 20 mg by mouth daily after breakfast.   SYSTANE ULTRA 0.4-0.3 % Soln Generic drug:  Polyethyl Glycol-Propyl Glycol Place 1-2 drops into both eyes daily as needed (for dry eyes).   traMADol 50 MG tablet Commonly known as:  ULTRAM Take 1 tablet (50 mg total) by mouth every 6 (six) hours as needed.   vitamin B-12 500 MCG tablet Commonly known as:  CYANOCOBALAMIN Take 500 mcg by mouth daily after breakfast.   Vitamin D (Ergocalciferol) 50000 units Caps capsule Commonly known as:  DRISDOL Take 50,000 Units by mouth 2 (two) times a week. Tuesday & Thursday after breakfast        Greater than thirty minutes were spend for face to face discharge instructions and discharge orders/summary in EPIC.   Signed: CROSS, MELISSA DEAL 07/18/2016, 4:02 PM

## 2016-07-22 ENCOUNTER — Telehealth: Payer: Self-pay | Admitting: *Deleted

## 2016-07-22 ENCOUNTER — Encounter: Payer: Self-pay | Admitting: Radiation Oncology

## 2016-07-22 ENCOUNTER — Telehealth: Payer: Self-pay | Admitting: Gynecologic Oncology

## 2016-07-22 NOTE — Telephone Encounter (Signed)
Contacted the patient and gave the appt for the radiation consult. Appt for August 9th at 2:30pm

## 2016-07-22 NOTE — Telephone Encounter (Signed)
Post op telephone call to check patient status.  Patient describes expected post operative status.  Adequate PO intake reported.  Bowels and bladder functioning without difficulty.  Pain minimal.  Reportable signs and symptoms reviewed.  Follow up appt arranged.  Final path discussed along with Dr. Serita Grit recommendations for vaginal brachytherapy.

## 2016-08-08 NOTE — Progress Notes (Signed)
GYN Location of Tumor / Histology: Endometrial Cancer  Traci Richardson presented with symptoms of: history of postmenopausal bleeding for 3 months  Biopsies revealed:   07/17/16 Diagnosis 1. Uterus +/- tubes/ovaries, neoplastic, cervix - UTERUS: -ENDO/MYOMETRIUM: INVASIVE ENDOMETRIOID ADENOCARCINOMA, FIGO GRADE 1, SPANNING 4 CM. TUMOR INVADES MORE THAN HALF THE MYOMETRIAL THICKNESS. SEE ONCOLOGY TABLE. -SEROSA: UNREMARKABLE. NO MALIGNANCY. - CERVIX: BENIGN SQUAMOUS AND ENDOCERVICAL MUCOSA. NO DYSPLASIA OR MALIGNANCY. - BILATERAL OVARIES: INCLUSION CYSTS. NO MALIGNANCY. - BILATERAL FALLOPIAN TUBES: UNREMARKABLE. NO MALIGNANCY. 2. Lymph node, sentinel, biopsy, left obturator - ONE OF ONE LYMPH NODES NEGATIVE FOR CARCINOMA (0/1).  Past/Anticipated interventions by Gyn/Onc surgery, if any: 07/17/16 - Procedure: ROBOTIC ASSISTED TOTAL HYSTERECTOMY WITH BILATERAL SALPINGO OOPHORECTOMY AND LYSIS OF ADHESIONS;  Surgeon: Everitt Amber, MD;    Past/Anticipated interventions by medical oncology, if any: none  Weight changes, if any: no  Bowel/Bladder complaints, if any: she is taking a stool softener since surgery.  She reports having shoulder pain, which usually means she has a UTI.  She had a urine culture done yesterday.  She does not have any urinary symptoms.  Nausea/Vomiting, if any: no  Pain issues, if any:  no  SAFETY ISSUES:  Prior radiation? no  Pacemaker/ICD? no  Possible current pregnancy? no  Is the patient on methotrexate? no  Current Complaints / other details:  Dr. Denman George is recommending vaginal brachytherapy.  She is here with her granddaughter.  Her husband was treated for prostate cancer here about 15 years ago.  BP (!) 143/98 (BP Location: Right Arm, Patient Position: Sitting)   Pulse 81   Temp 98.3 F (36.8 C) (Oral)   Ht 5' (1.524 m)   Wt 193 lb 6.4 oz (87.7 kg)   SpO2 97%   BMI 37.77 kg/m    Wt Readings from Last 3 Encounters:  08/14/16 193 lb 6.4 oz (87.7  kg)  08/13/16 181 lb 14.4 oz (82.5 kg)  07/17/16 189 lb (85.7 kg)

## 2016-08-13 ENCOUNTER — Ambulatory Visit: Payer: Medicare Other | Admitting: Gynecologic Oncology

## 2016-08-13 VITALS — BP 114/98 | HR 91 | Temp 98.2°F | Resp 18 | Wt 181.9 lb

## 2016-08-13 DIAGNOSIS — C541 Malignant neoplasm of endometrium: Secondary | ICD-10-CM | POA: Diagnosis not present

## 2016-08-13 DIAGNOSIS — R300 Dysuria: Secondary | ICD-10-CM | POA: Diagnosis not present

## 2016-08-13 NOTE — Progress Notes (Unsigned)
Follow-up Note: Gyn-Onc  Consult was requested by Dr. Garwin Brothers for the evaluation of Traci Richardson 83 y.o. female  CC:  Endometrial cancer follow-up Urinary frequency  Assessment/Plan:  [name]  is a 83 y.o.  year old with stage IB grade 1 endometriral adenocarcnoma.  High/intermediate risk factors for recurrence. Recommendation is for vaginal brachytherapy to reduce risk for local recurrence in accordance with NCCN guidelines.  I discussed this with the patient. I discussed the role of adjuvant therapy. I discussed prognosis and risk for recurrence. We reviewed symptoms concerning for recurrence and she will see me if these develop prior to her scheduled appointment.  After completing adjuvant therapy I recommend she follow-up at 3 monthly intervals for symptom review, physical examination and pelvic examination. Pap smear is not recommended in routine endometrial cancer surveillance. After 2 years we will space these visits to every 6 months, and then annually if recurrence has not developed within 5 years. All questions were answered.  She has urinary frequency. This may be a UTI. Will check culture and sensitivities.  HPI: Saidah Richardson is an 83 year old who is seen in consultation at the request of Dr Garwin Brothers for grade 1 endometrial cancer.  The patient has a history of postmenopausal bleeding for 3 months. She was seen by Dr Garwin Brothers on 06/24/16 and [place] was performed which showed a 1.9cm right ovarian complex cyst, a 29mm thickened endometrium with a polyp and a normal uterus.   She went to the OR 09/01/26 and a hysteroscopy and polypectomy was performed. Final pathology showed a grade 1 endometrial cancer with CAH.  She is 83 years of age but of excellent general health. She has had a lap chole and open umbilical hernia repair with mesh. She has had 2 vaginal deliveries. Her daughter has a history of endometrial cancer, but there is no other cancer history in the family.    She takes ASA 81mg  daily.  Interval Hx: On 07/17/16 she underwent robotic hysterectomy, bso and SLN biopsy. Surgery was uncomplicated. Final pathology revealed a grade 1 cancer, with 4cm maximum size. The myometrial invasion was 0.6 of 1cm. There was no LVSI. A single left obturator SLN was negative.  In the past 4 days she has developed urinary frequency that she feels may be a UTI. No fevers or chills.  Current Meds:  Outpatient Encounter Prescriptions as of 08/13/2016  Medication Sig  . acetaminophen (TYLENOL) 500 MG tablet Take 500 mg by mouth daily as needed (for pain (in the afternoon as needed)).  Marland Kitchen aspirin EC 81 MG tablet Take 81 mg by mouth daily after breakfast.  . busPIRone (BUSPAR) 5 MG tablet Take 5 mg by mouth daily after breakfast.  . ezetimibe (ZETIA) 10 MG tablet Take 10 mg by mouth daily after breakfast.  . levothyroxine (SYNTHROID, LEVOTHROID) 100 MCG tablet Take 100 mcg by mouth daily before breakfast. On an empty stomach  . naproxen sodium (ANAPROX) 220 MG tablet Take 440 mg by mouth daily after breakfast.  . nitrofurantoin, macrocrystal-monohydrate, (MACROBID) 100 MG capsule Take 1 capsule (100 mg total) by mouth 2 (two) times daily.  Vladimir Faster Glycol-Propyl Glycol (SYSTANE ULTRA) 0.4-0.3 % SOLN Place 1-2 drops into both eyes daily as needed (for dry eyes).  . simvastatin (ZOCOR) 20 MG tablet Take 20 mg by mouth daily after breakfast.  . traMADol (ULTRAM) 50 MG tablet Take 1 tablet (50 mg total) by mouth every 6 (six) hours as needed.  . vitamin B-12 (  CYANOCOBALAMIN) 500 MCG tablet Take 500 mcg by mouth daily after breakfast.  . Vitamin D, Ergocalciferol, (DRISDOL) 50000 units CAPS capsule Take 50,000 Units by mouth 2 (two) times a week. Tuesday & Thursday after breakfast   No facility-administered encounter medications on file as of 08/13/2016.     Allergy:  Allergies  Allergen Reactions  . Sulfonamide Derivatives Hives  . Codeine Other (See Comments)     Passes out  . Tramadol Other (See Comments)    Mood changes including confusion.  . Latex Itching and Rash    On hands w/latex gloves    Social Hx:   Social History   Social History  . Marital status: Widowed    Spouse name: N/A  . Number of children: N/A  . Years of education: N/A   Occupational History  . Not on file.   Social History Main Topics  . Smoking status: Never Smoker  . Smokeless tobacco: Never Used  . Alcohol use No  . Drug use: No  . Sexual activity: No   Other Topics Concern  . Not on file   Social History Narrative  . No narrative on file    Past Surgical Hx:  Past Surgical History:  Procedure Laterality Date  . CATARACT EXTRACTION, BILATERAL    . CHOLECYSTECTOMY     then a repair of wound opening 2 years later  . COLONOSCOPY    . DILATATION & CURETTAGE/HYSTEROSCOPY WITH MYOSURE N/A 07/01/2016   Procedure: DILATATION & CURETTAGE/HYSTEROSCOPY WITH MYOSURE;  Surgeon: Servando Salina, MD;  Location: [facility];  Service: Gynecology;  Laterality: N/A;  . LEFT KNEE SURGERY     meniscus repair  . ROBOTIC ASSISTED TOTAL HYSTERECTOMY WITH BILATERAL SALPINGO OOPHERECTOMY N/A 07/17/2016   Procedure: ROBOTIC ASSISTED TOTAL HYSTERECTOMY WITH BILATERAL SALPINGO OOPHORECTOMY AND LYSIS OF ADHESIONS;  Surgeon: Everitt Amber, MD;  Location: [facility];  Service: Gynecology;  Laterality: N/A;  . SENTINEL NODE BIOPSY N/A 07/17/2016   Procedure: SENTINEL NODE BIOPSY;  Surgeon: Everitt Amber, MD;  Location: [facility];  Service: Gynecology;  Laterality: N/A;    Past Medical Hx:  Past Medical History:  Diagnosis Date  . Anxiety   . Arthritis   . Cancer [facility]) 2018   endometrial cancer  . Depression   . High cholesterol   . Hypothyroidism   . Pneumonia 4-5 yrs ago  . UTI (urinary tract infection)     Past Gynecological History:   No LMP recorded. Patient is postmenopausal.  Family Hx: No family history on file.  Review of Systems:  Constitutional  Feels well,     ENT Normal appearing ears and nares bilaterally Skin/Breast  No rash, sores, jaundice, itching, dryness Cardiovascular  No chest pain, shortness of breath, or edema  Pulmonary  No cough or wheeze.  Gastro Intestinal  No nausea, vomitting, or diarrhoea. No bright red blood per rectum, no abdominal pain, change in bowel movement, or constipation.  Genito Urinary  + frequency, urgency, dysuria, no bleeding Musculo Skeletal  No myalgia, arthralgia, joint swelling or pain  Neurologic  No weakness, numbness, change in gait,  Psychology  No depression, anxiety, insomnia.   Vitals:  There were no vitals taken for this visit.  Physical Exam: WD in NAD Neck  Supple NROM, without any enlargements.  Lymph Node Survey No cervical supraclavicular or inguinal adenopathy Cardiovascular  Pulse normal rate, regularity and rhythm. S1 and S2 normal.  Lungs  Clear to auscultation bilateraly, without wheezes/crackles/rhonchi. Good air movement.  Skin  No rash/lesions/breakdown  Psychiatry  Alert and oriented to person, place, and time  Abdomen  Normoactive bowel sounds, abdomen soft, non-tender and obese without evidence of hernia. Incisions healing well. Back No CVA tenderness Genito Urinary  Normal vaginal cuff. No masses or lesions. Rectal  deferred  Extremities  No bilateral cyanosis, clubbing or edema.   30 minutes of direct face to face counseling time was spent with the patient. This included discussion about prognosis, therapy recommendations and postoperative side effects and are beyond the scope of routine postoperative care.  Donaciano Eva, MD  08/13/2016, 6:09 PM

## 2016-08-14 ENCOUNTER — Ambulatory Visit
Admission: RE | Admit: 2016-08-14 | Discharge: 2016-08-14 | Disposition: A | Discharge status: Home / Self Care | Payer: Medicare Other | Source: Ambulatory Visit | Attending: Radiation Oncology | Admitting: Radiation Oncology

## 2016-08-14 ENCOUNTER — Encounter: Payer: Self-pay | Admitting: Radiation Oncology

## 2016-08-14 ENCOUNTER — Encounter: Payer: Self-pay | Admitting: Gynecologic Oncology

## 2016-08-14 ENCOUNTER — Other Ambulatory Visit: Payer: Self-pay | Admitting: *Deleted

## 2016-08-14 ENCOUNTER — Ambulatory Visit: Payer: Medicare Other

## 2016-08-14 VITALS — BP 143/98 | HR 81 | Temp 98.3°F | Ht 60.0 in | Wt 193.4 lb

## 2016-08-14 DIAGNOSIS — Z90722 Acquired absence of ovaries, bilateral: Secondary | ICD-10-CM | POA: Diagnosis not present

## 2016-08-14 DIAGNOSIS — C541 Malignant neoplasm of endometrium: Secondary | ICD-10-CM

## 2016-08-14 DIAGNOSIS — Z90710 Acquired absence of both cervix and uterus: Secondary | ICD-10-CM | POA: Insufficient documentation

## 2016-08-14 DIAGNOSIS — Z510 Encounter for antineoplastic radiation therapy: Secondary | ICD-10-CM | POA: Diagnosis not present

## 2016-08-14 DIAGNOSIS — Z8049 Family history of malignant neoplasm of other genital organs: Secondary | ICD-10-CM | POA: Diagnosis not present

## 2016-08-14 DIAGNOSIS — R300 Dysuria: Secondary | ICD-10-CM

## 2016-08-14 NOTE — Progress Notes (Signed)
Radiation Oncology         [phone] ________________________________  Initial Outpatient Consultation  Name: Traci Richardson MRN: [id number]  Date: 08/14/2016  DOB: [birth year]  RF:FMBWG, Annie Main, MD  Everitt Amber, MD   REFERRING PHYSICIAN: Everitt Amber, MD  DIAGNOSIS:  Stage IB grade I endometrial adenocarcinoma  HISTORY OF PRESENT ILLNESS::Traci Richardson is a 83 y.o. female who presents to the office today accompanied by her granddaughter. Of note, pt initially had symptoms of postmenopausal bleeding (spotting) x 3 months prior to her diagnosis. Pt had a D&C completed on 07/01/16 with resection of endometrial polyp completed by Dr. Servando Salina. Pt had a hysterectomy, BSO, and biopsy completed on 07/17/16 completed by Dr. Everitt Amber. Pt pathology report on 07/17/16 was significant for abnormalities of invasive endometrioid adenocarcinoma, FIGO grade 1 spanning 4 cm with lymph nodes negative for malignancy. Tumor was deeply invasive into the myometrium (0.6 cm where thickness was 1.0 cm).Pt denies any complications following her surgeries. Pt reports that her daughter had a hx of endometrial CA at 39 years old.   Pt reports that she followed up with Dr. Denman George on 08/13/16 and had an internal exam completed with no noted abnormalities. Pt was also instructed to follow up with radiation oncology to schedule adjuvant radiation therapy treatments. She notes that Dr. Denman George sent out an urine culture yesterday (08/13/16) for further evaluation that is still pending. Denies vaginal bleeding, dysuria, bowel issues. She notes that her husband passed away in 02/19/2016.   On review of systems, pt denies fever, chills, weight loss, decreased appetite, decreased energy levels. Denies pain. Denies vaginal bleeding, dysuria, bowel issues.   PREVIOUS RADIATION THERAPY: No  PAST MEDICAL HISTORY:  has a past medical history of Anxiety; Arthritis; Cancer ([place]) (2018); Depression; High cholesterol;  Hypothyroidism; Pneumonia (4-5 yrs ago); and UTI (urinary tract infection).    PAST SURGICAL HISTORY: Past Surgical History:  Procedure Laterality Date  . CATARACT EXTRACTION, BILATERAL    . CHOLECYSTECTOMY     then a repair of wound opening 2 years later  . COLONOSCOPY    . DILATATION & CURETTAGE/HYSTEROSCOPY WITH MYOSURE N/A 07/01/2016   Procedure: DILATATION & CURETTAGE/HYSTEROSCOPY WITH MYOSURE;  Surgeon: Servando Salina, MD;  Location: [facility];  Service: Gynecology;  Laterality: N/A;  . LEFT KNEE SURGERY     meniscus repair  . ROBOTIC ASSISTED TOTAL HYSTERECTOMY WITH BILATERAL SALPINGO OOPHERECTOMY N/A 07/17/2016   Procedure: ROBOTIC ASSISTED TOTAL HYSTERECTOMY WITH BILATERAL SALPINGO OOPHORECTOMY AND LYSIS OF ADHESIONS;  Surgeon: Everitt Amber, MD;  Location: [facility];  Service: Gynecology;  Laterality: N/A;  . SENTINEL NODE BIOPSY N/A 07/17/2016   Procedure: SENTINEL NODE BIOPSY;  Surgeon: Everitt Amber, MD;  Location: [facility];  Service: Gynecology;  Laterality: N/A;    FAMILY HISTORY: family history includes Thyroid cancer in her sister. Endometrial cancer in daughter at age 60  SOCIAL HISTORY:  reports that she has never smoked. She has never used smokeless tobacco. She reports that she does not drink alcohol or use drugs.  ALLERGIES: Sulfonamide derivatives; Codeine; Tramadol; and Latex  MEDICATIONS:  Current Outpatient Prescriptions  Medication Sig Dispense Refill  . acetaminophen (TYLENOL) 500 MG tablet Take 500 mg by mouth daily as needed (for pain (in the afternoon as needed)).    Marland Kitchen aspirin EC 81 MG tablet Take 81 mg by mouth daily after breakfast.    . busPIRone (BUSPAR) 5 MG tablet Take 5 mg by mouth daily after breakfast.    .  ezetimibe (ZETIA) 10 MG tablet Take 10 mg by mouth daily after breakfast.  6  . levothyroxine (SYNTHROID, LEVOTHROID) 100 MCG tablet Take 100 mcg by mouth daily before breakfast. On an empty stomach    . naproxen sodium (ANAPROX) 220 MG tablet Take  440 mg by mouth daily after breakfast.    . Polyethyl Glycol-Propyl Glycol (SYSTANE ULTRA) 0.4-0.3 % SOLN Place 1-2 drops into both eyes daily as needed (for dry eyes).    . simvastatin (ZOCOR) 20 MG tablet Take 20 mg by mouth daily after breakfast.  6  . vitamin B-12 (CYANOCOBALAMIN) 500 MCG tablet Take 500 mcg by mouth daily after breakfast.    . Vitamin D, Ergocalciferol, (DRISDOL) 50000 units CAPS capsule Take 50,000 Units by mouth 2 (two) times a week. Tuesday & Thursday after breakfast  3  . nitrofurantoin, macrocrystal-monohydrate, (MACROBID) 100 MG capsule Take 1 capsule (100 mg total) by mouth 2 (two) times daily. (Patient not taking: Reported on 08/14/2016) 10 capsule 0  . traMADol (ULTRAM) 50 MG tablet Take 1 tablet (50 mg total) by mouth every 6 (six) hours as needed. (Patient not taking: Reported on 08/14/2016) 10 tablet 0   No current facility-administered medications for this encounter.     REVIEW OF SYSTEMS: A 10+ POINT REVIEW OF SYSTEMS WAS OBTAINED including neurology, dermatology, psychiatry, cardiac, respiratory, lymph, extremities, GI, GU, musculoskeletal, constitutional, reproductive, HEENT. All pertinent positives are noted in the HPI. All others are negative.   PHYSICAL EXAM:  height is 5' (1.524 m) and weight is 193 lb 6.4 oz (87.7 kg). Her oral temperature is 98.3 F (36.8 C). Her blood pressure is 143/98 (abnormal) and her pulse is 81. Her oxygen saturation is 97%.   General: Alert and oriented, in no acute distress HEENT: Head is normocephalic. Extraocular movements are intact. Oropharynx is clear. Neck: Neck is supple, no palpable cervical or supraclavicular lymphadenopathy. Heart: Regular in rate and rhythm with no murmurs, rubs, or gallops. Chest: Clear to auscultation bilaterally, with no rhonchi, wheezes, or rales. Abdomen: Soft, nontender, nondistended, with no rigidity or guarding. Small scars along the abdomen area from laproscopic procedure all healing well  without signs of drainage or infection.  Pelvic exam deferred until simulation and planning day.  Extremities: No cyanosis or edema. Lymphatics: see Neck Exam Skin: No concerning lesions. Musculoskeletal: symmetric strength and muscle tone throughout. Neurologic: Cranial nerves II through XII are grossly intact. No obvious focalities. Speech is fluent. Coordination is intact. Psychiatric: Judgment and insight are intact. Affect is appropriate   ECOG = 1   LABORATORY DATA:  Lab Results  Component Value Date   WBC 9.9 07/18/2016   HGB 12.8 07/18/2016   HCT 39.0 07/18/2016   MCV 93.3 07/18/2016   PLT 176 07/18/2016   NEUTROABS 9.5 (H) 03/18/2012   Lab Results  Component Value Date   NA 138 07/18/2016   K 4.2 07/18/2016   CL 102 07/18/2016   CO2 29 07/18/2016   GLUCOSE 128 (H) 07/18/2016   CREATININE 0.97 07/18/2016   CALCIUM 8.9 07/18/2016      RADIOGRAPHY: No results found.    IMPRESSION: Traci Richardson is a 83 y.o. woman with stage IB grade 1 endometrial adenocarcinoma. The pt has High/intermediate risk factors for recurrence. I would recommend vaginal brachytherapy to reduce risk for local recurrence in accordance with NCCN guidelines.  Today, I talked to the patient about the findings and work-up thus far. We discussed the patient's diagnosis of stage IB grade 1  endometrial adenocarcinoma and general treatment for this, highlighting the role of radiotherapy in the management. We discussed the available radiation techniques, and focused on the details of logistics and delivery.   We discussed the risks, benefits, short and long term effects of treatment with the patient. Encouraged the patient to ask questions, to which all questions were answered during her visit today. A consent form was signed and placed in the patient's medical record. Patient encouraged to call the office if she has any further questions prior to her appointment.    PLAN: Will schedule CT  simulation appointment for next week, assuming schedule will allow. Pt will begin radiation therapy with 5 vaginal brachytherapy treatments planned.     ------------------------------------------------  Blair Promise, PhD, MD    This document serves as a record of services personally performed by Gery Pray, MD. It was created on her behalf by [facility], a trained medical scribe. The creation of this record is based on the scribe's personal observations and the provider's statements to them. This document has been checked and approved by the attending provider.

## 2016-08-14 NOTE — Progress Notes (Signed)
Please see the Nurse Progress Note in the MD Initial Consult Encounter for this patient. 

## 2016-08-15 ENCOUNTER — Telehealth: Payer: Self-pay | Admitting: Oncology

## 2016-08-15 LAB — URINE CULTURE: Organism ID, Bacteria: NO GROWTH

## 2016-08-15 NOTE — Telephone Encounter (Signed)
Called patient and advised her of the schedule for her radiation treatments starting on 08/21/16 - 09/18/16.  She verbalized understanding and agreement.

## 2016-08-20 ENCOUNTER — Telehealth: Payer: Self-pay | Admitting: *Deleted

## 2016-08-20 ENCOUNTER — Ambulatory Visit: Payer: Medicare Other | Admitting: Radiation Oncology

## 2016-08-20 NOTE — Progress Notes (Signed)
Radiation Oncology         [phone] ________________________________  Name: Traci Richardson MRN: [id number]  Date: 08/21/2016  DOB: [birth year]  Vaginal Brachytherapy Procedure Note  CC: Reynold Bowen, MD Everitt Amber, MD    ICD-10-CM   1. Endometrial adenocarcinoma (HCC) C54.1     Diagnosis: Stage IB grade I endometrial adenocarcinoma  Narrative: She returns today for vaginal cylinder fitting. She denies bleeding or discharge. She reports having a bowel movement this morning. Appetite is good. No complaints of pain. No urinary symptoms.  ALLERGIES: is allergic to sulfonamide derivatives; codeine; tramadol; and latex.  Meds: Current Outpatient Prescriptions  Medication Sig Dispense Refill  . acetaminophen (TYLENOL) 500 MG tablet Take 500 mg by mouth daily as needed (for pain (in the afternoon as needed)).    Marland Kitchen aspirin EC 81 MG tablet Take 81 mg by mouth daily after breakfast.    . busPIRone (BUSPAR) 5 MG tablet Take 5 mg by mouth daily after breakfast.    . ezetimibe (ZETIA) 10 MG tablet Take 10 mg by mouth daily after breakfast.  6  . levothyroxine (SYNTHROID, LEVOTHROID) 100 MCG tablet Take 100 mcg by mouth daily before breakfast. On an empty stomach    . naproxen sodium (ANAPROX) 220 MG tablet Take 440 mg by mouth daily after breakfast.    . Polyethyl Glycol-Propyl Glycol (SYSTANE ULTRA) 0.4-0.3 % SOLN Place 1-2 drops into both eyes daily as needed (for dry eyes).    . simvastatin (ZOCOR) 20 MG tablet Take 20 mg by mouth daily after breakfast.  6  . vitamin B-12 (CYANOCOBALAMIN) 500 MCG tablet Take 500 mcg by mouth daily after breakfast.    . Vitamin D, Ergocalciferol, (DRISDOL) 50000 units CAPS capsule Take 50,000 Units by mouth 2 (two) times a week. Tuesday & Thursday after breakfast  3  . nitrofurantoin, macrocrystal-monohydrate, (MACROBID) 100 MG capsule Take 1 capsule (100 mg total) by mouth 2 (two) times daily. (Patient not taking: Reported on 08/14/2016) 10 capsule 0  .  traMADol (ULTRAM) 50 MG tablet Take 1 tablet (50 mg total) by mouth every 6 (six) hours as needed. (Patient not taking: Reported on 08/14/2016) 10 tablet 0   No current facility-administered medications for this encounter.     Physical Findings: The patient is in no acute distress. Patient is alert and oriented.  height is 5' (1.524 m) and weight is 192 lb 6.4 oz (87.3 kg). Her oral temperature is 98.2 F (36.8 C). Her blood pressure is 163/99 (abnormal) and her pulse is 80. Her respiration is 20 and oxygen saturation is 98%.   On pelvic examination the external genitalia were unremarkable. A speculum exam was performed. Vaginal cuff intact, no mucosal lesions. On bimanual exam there were no pelvic masses appreciated.  Lab Findings: Lab Results  Component Value Date   WBC 9.9 07/18/2016   HGB 12.8 07/18/2016   HCT 39.0 07/18/2016   MCV 93.3 07/18/2016   PLT 176 07/18/2016    Radiographic Findings: No results found.  Impression: Stage IB grade I endometrial adenocarcinoma   Patient was fitted for a vaginal cylinder. The patient will be treated with a 3 cm diameter cylinder with a treatment length of 3.5 cm. This distended the vaginal vault without undue discomfort. The patient tolerated the procedure well.  The patient was successfully fitted for a vaginal cylinder. The patient is appropriate to begin vaginal brachytherapy.   Plan: The patient will proceed with CT simulation and vaginal brachytherapy today.  _______________________________   Blair Promise, PhD, MD  This document serves as a record of services personally performed by Gery Pray, MD. It was created on his behalf by Arlyce Harman, a trained medical scribe. The creation of this record is based on the scribe's personal observations and the provider's statements to them. This document has been checked and approved by the attending provider.

## 2016-08-20 NOTE — Telephone Encounter (Signed)
Called patient to remind of [facility] for 08-21-16, spoke with patient and she is aware of this appt.

## 2016-08-21 ENCOUNTER — Ambulatory Visit
Admission: RE | Admit: 2016-08-21 | Discharge: 2016-08-21 | Disposition: A | Discharge status: Home / Self Care | Payer: Medicare Other | Source: Ambulatory Visit | Attending: Radiation Oncology | Admitting: Radiation Oncology

## 2016-08-21 ENCOUNTER — Encounter: Payer: Self-pay | Admitting: Radiation Oncology

## 2016-08-21 DIAGNOSIS — C541 Malignant neoplasm of endometrium: Secondary | ICD-10-CM

## 2016-08-21 DIAGNOSIS — Z90710 Acquired absence of both cervix and uterus: Secondary | ICD-10-CM | POA: Diagnosis not present

## 2016-08-21 DIAGNOSIS — Z90722 Acquired absence of ovaries, bilateral: Secondary | ICD-10-CM | POA: Diagnosis not present

## 2016-08-21 DIAGNOSIS — Z510 Encounter for antineoplastic radiation therapy: Secondary | ICD-10-CM | POA: Diagnosis not present

## 2016-08-21 NOTE — Progress Notes (Addendum)
Follow up   New consult Endometrial cancer No c/o bleeding,discharge, had bowel movement this am, aappetite good, no c/o pain, here for HDt  Treatment #1 7:53 AM BP (!) 163/99   Pulse 80   Temp 98.2 F (36.8 C) (Oral)   Resp 20   Ht 5' (1.524 m)   Wt 192 lb 6.4 oz (87.3 kg)   SpO2 98%   BMI 37.58 kg/m   Wt Readings from Last 3 Encounters:  08/21/16 192 lb 6.4 oz (87.3 kg)  08/14/16 193 lb 6.4 oz (87.7 kg)  08/13/16 181 lb 14.4 oz (82.5 kg)

## 2016-08-21 NOTE — Progress Notes (Signed)
  Radiation Oncology         [phone] ________________________________  Name: Traci Richardson MRN: [id number]  Date: 08/21/2016  DOB: 01-14-1925  CC: Reynold Bowen, MD  Everitt Amber, MD  HDR BRACHYTHERAPY NOTE  DIAGNOSIS: Stage IB grade I endometrial adenocarcinoma   Simple treatment device note: Patient had construction of her custom vaginal cylinder. She will be treated with a 3 cm diameter segmented cylinder. This conforms to her anatomy without undue discomfort.  Vaginal brachytherapy procedure node: The patient was brought to the [place] suite. Identity was confirmed. All relevant records and images related to the planned course of therapy were reviewed. The patient freely provided informed written consent to proceed with treatment after reviewing the details related to the planned course of therapy. The consent form was witnessed and verified by the simulation staff. Then, the patient was set-up in a stable reproducible supine position for radiation therapy. Pelvic exam revealed the vaginal cuff to be intact. The patient's custom vaginal cylinder was placed in the proximal vagina. This was affixed to the CT/MR stabilization plate to prevent slippage. Patient tolerated the placement well.  Verification simulation note:  A fiducial marker was placed within the vaginal cylinder. An AP and lateral film was then obtained through the pelvis area. This documented accurate position of the vaginal cylinder for treatment.  HDR BRACHYTHERAPY TREATMENT  The remote afterloading device was affixed to the vaginal cylinder by catheter. Patient then proceeded to undergo her first high-dose-rate treatment directed at the proximal vagina. The patient was prescribed a dose of 6 gray to be delivered to the mucosal surface. Treatment length was 3.5 cm. Patient was treated with 1 channel using 8 dwell positions. Treatment time was 251.1 seconds. Iridium 192 was the high-dose-rate source for treatment. The patient  tolerated the treatment well. After completion of her therapy, a radiation survey was performed documenting return of the iridium source into the [employer] safe.   PLAN: The patient will return in one week for her next treatment. ________________________________  Blair Promise, PhD, MD   This document serves as a record of services personally performed by Gery Pray, MD. It was created on his behalf by Arlyce Harman, a trained medical scribe. The creation of this record is based on the scribe's personal observations and the provider's statements to them. This document has been checked and approved by the attending provider.

## 2016-08-21 NOTE — Progress Notes (Signed)
Please see the Nurse Progress Note in the MD Initial Consult Encounter for this patient. 

## 2016-08-21 NOTE — Progress Notes (Signed)
  Radiation Oncology         [phone] ________________________________  Name: Traci Richardson MRN: [id number]  Date: 08/21/2016  DOB: 1925-07-22  SIMULATION AND TREATMENT PLANNING NOTE HDR BRACHYTHERAPY  DIAGNOSIS:  Stage IB grade I endometrial adenocarcinoma  NARRATIVE:  The patient was brought to the [facility].  Identity was confirmed.  All relevant records and images related to the planned course of therapy were reviewed.  The patient freely provided informed written consent to proceed with treatment after reviewing the details related to the planned course of therapy. The consent form was witnessed and verified by the simulation staff.  Then, the patient was set-up in a stable reproducible  supine position for radiation therapy.  CT images were obtained.  Surface markings were placed.  The CT images were loaded into the planning software.  Then the target and avoidance structures were contoured.  Treatment planning then occurred.  The radiation prescription was entered and confirmed.   I have requested : Brachytherapy Isodose Plan and Dosimetry Calculations to plan the radiation distribution.    PLAN:  The patient will receive 30 Gy in 5 fractions. She will be treated with a 3 cm diameter cylinder with a treatment length of 3.5 cm. Prescription will be to the mucosal surface. The patient will be treated with iridium 192 as the high-dose-rate source.   ________________________________  Blair Promise, PhD, MD   This document serves as a record of services personally performed by Gery Pray, MD. It was created on his behalf by Arlyce Harman, a trained medical scribe. The creation of this record is based on the scribe's personal observations and the provider's statements to them. This document has been checked and approved by the attending provider.

## 2016-08-22 ENCOUNTER — Encounter: Payer: Self-pay | Admitting: Radiation Oncology

## 2016-08-25 ENCOUNTER — Encounter: Payer: Self-pay | Admitting: Radiation Oncology

## 2016-08-26 ENCOUNTER — Encounter: Payer: Self-pay | Admitting: Radiation Oncology

## 2016-08-27 ENCOUNTER — Telehealth: Payer: Self-pay | Admitting: *Deleted

## 2016-08-27 ENCOUNTER — Encounter: Payer: Self-pay | Admitting: Radiation Oncology

## 2016-08-27 NOTE — Telephone Encounter (Signed)
Called patient to remind of HDR Tx. for 08-28-16 @ 2 pm, lvm for a return call

## 2016-08-28 ENCOUNTER — Ambulatory Visit
Admission: RE | Admit: 2016-08-28 | Discharge: 2016-08-28 | Disposition: A | Discharge status: Home / Self Care | Payer: Medicare Other | Source: Ambulatory Visit | Attending: Radiation Oncology | Admitting: Radiation Oncology

## 2016-08-28 DIAGNOSIS — C541 Malignant neoplasm of endometrium: Secondary | ICD-10-CM | POA: Diagnosis not present

## 2016-08-28 DIAGNOSIS — Z510 Encounter for antineoplastic radiation therapy: Secondary | ICD-10-CM | POA: Diagnosis not present

## 2016-08-28 DIAGNOSIS — Z90722 Acquired absence of ovaries, bilateral: Secondary | ICD-10-CM | POA: Diagnosis not present

## 2016-08-28 DIAGNOSIS — Z90710 Acquired absence of both cervix and uterus: Secondary | ICD-10-CM | POA: Diagnosis not present

## 2016-08-28 NOTE — Progress Notes (Signed)
  Radiation Oncology         [phone] ________________________________  Name: Traci Richardson MRN: [id number]  Date: 08/28/2016  DOB: 06/01/1925  CC: Reynold Bowen, MD  Everitt Amber, MD  HDR BRACHYTHERAPY NOTE  DIAGNOSIS: Stage IB grade I endometrial adenocarcinoma   Simple treatment device note: Patient had construction of her custom vaginal cylinder. She will be treated with a 3 cm diameter segmented cylinder. This conforms to her anatomy without undue discomfort.  Vaginal brachytherapy procedure node: The patient was brought to the [place] suite. Identity was confirmed. All relevant records and images related to the planned course of therapy were reviewed. The patient freely provided informed written consent to proceed with treatment after reviewing the details related to the planned course of therapy. The consent form was witnessed and verified by the simulation staff. Then, the patient was set-up in a stable reproducible supine position for radiation therapy. Pelvic exam revealed the vaginal cuff to be intact. The patient's custom vaginal cylinder was placed in the proximal vagina. This was affixed to the CT/MR stabilization plate to prevent slippage. Patient tolerated the placement well.  Verification simulation note:  A fiducial marker was placed within the vaginal cylinder. An AP and lateral film was then obtained through the pelvis area. This documented accurate position of the vaginal cylinder for treatment.  HDR BRACHYTHERAPY TREATMENT  The remote afterloading device was affixed to the vaginal cylinder by catheter. Patient then proceeded to undergo her second high-dose-rate treatment directed at the proximal vagina. The patient was prescribed a dose of 6 gray to be delivered to the mucosal surface. Treatment length was 3.5 cm. Patient was treated with 1 channel using 8 dwell positions. Treatment time was 268.2 seconds. Iridium 192 was the high-dose-rate source for treatment. The patient  tolerated the treatment well. After completion of her therapy, a radiation survey was performed documenting return of the iridium source into the GammaMed safe.   PLAN: The patient will return in one week for her next treatment. ________________________________  Blair Promise, PhD, MD   This document serves as a record of services personally performed by Gery Pray, MD. It was created on his behalf by Arlyce Harman, a trained medical scribe. The creation of this record is based on the scribe's personal observations and the provider's statements to them. This document has been checked and approved by the attending provider.

## 2016-09-03 ENCOUNTER — Telehealth: Payer: Self-pay | Admitting: *Deleted

## 2016-09-03 NOTE — Telephone Encounter (Signed)
CALLED PATIENT TO REMIND OF HDR [place] 09-04-16 @ 2 PM, SPOKE WITH PATIENT AND SHE IS AWARE OF THIS [place].

## 2016-09-04 ENCOUNTER — Ambulatory Visit
Admission: RE | Admit: 2016-09-04 | Discharge: 2016-09-04 | Disposition: A | Discharge status: Home / Self Care | Payer: Medicare Other | Source: Ambulatory Visit | Attending: Radiation Oncology | Admitting: Radiation Oncology

## 2016-09-04 DIAGNOSIS — Z90710 Acquired absence of both cervix and uterus: Secondary | ICD-10-CM | POA: Diagnosis not present

## 2016-09-04 DIAGNOSIS — C541 Malignant neoplasm of endometrium: Secondary | ICD-10-CM | POA: Diagnosis not present

## 2016-09-04 DIAGNOSIS — Z90722 Acquired absence of ovaries, bilateral: Secondary | ICD-10-CM | POA: Diagnosis not present

## 2016-09-04 DIAGNOSIS — Z510 Encounter for antineoplastic radiation therapy: Secondary | ICD-10-CM | POA: Diagnosis not present

## 2016-09-04 NOTE — Progress Notes (Signed)
  Radiation Oncology         [phone] ________________________________  Name: Traci Richardson MRN: [id number]  Date: 09/04/2016  DOB: Sep 28, 1925  CC: Reynold Bowen, MD  Everitt Amber, MD  HDR BRACHYTHERAPY NOTE  DIAGNOSIS: Stage IB grade I endometrial adenocarcinoma   Simple treatment device note: Patient had construction of her custom vaginal cylinder. She will be treated with a 3 cm diameter segmented cylinder. This conforms to her anatomy without undue discomfort.  Vaginal brachytherapy procedure node: The patient was brought to the [place] suite. Identity was confirmed. All relevant records and images related to the planned course of therapy were reviewed. The patient freely provided informed written consent to proceed with treatment after reviewing the details related to the planned course of therapy. The consent form was witnessed and verified by the simulation staff. Then, the patient was set-up in a stable reproducible supine position for radiation therapy. Pelvic exam revealed the vaginal cuff to be intact. The patient's custom vaginal cylinder was placed in the proximal vagina. This was affixed to the CT/MR stabilization plate to prevent slippage. Patient tolerated the placement well.  Verification simulation note:  A fiducial marker was placed within the vaginal cylinder. An AP and lateral film was then obtained through the pelvis area. This documented accurate position of the vaginal cylinder for treatment.  HDR BRACHYTHERAPY TREATMENT  The remote afterloading device was affixed to the vaginal cylinder by catheter. Patient then proceeded to undergo her third high-dose-rate treatment directed at the proximal vagina. The patient was prescribed a dose of 6 gray to be delivered to the mucosal surface. Treatment length was 3.5 cm. Patient was treated with 1 channel using 8 dwell positions. Treatment time was 286.4 seconds. Iridium 192 was the high-dose-rate source for treatment. The patient  tolerated the treatment well. After completion of her therapy, a radiation survey was performed documenting return of the iridium source into the GammaMed safe.   PLAN: The patient will return in one week for her next treatment. ________________________________  Blair Promise, PhD, MD   This document serves as a record of services personally performed by Gery Pray, MD. It was created on his behalf by Valeta Harms, a trained medical scribe. The creation of this record is based on the scribe's personal observations and the provider's statements to them. This document has been checked and approved by the attending provider.

## 2016-09-10 ENCOUNTER — Telehealth: Payer: Self-pay | Admitting: *Deleted

## 2016-09-10 NOTE — Telephone Encounter (Signed)
CALLED PATIENT TO REMIND OF HDR [place] 09-11-16 @ 2 PM, LVM FOR A RETURN CALL

## 2016-09-11 ENCOUNTER — Ambulatory Visit
Admission: RE | Admit: 2016-09-11 | Discharge: 2016-09-11 | Disposition: A | Discharge status: Home / Self Care | Payer: Medicare Other | Source: Ambulatory Visit | Attending: Radiation Oncology | Admitting: Radiation Oncology

## 2016-09-11 DIAGNOSIS — Z510 Encounter for antineoplastic radiation therapy: Secondary | ICD-10-CM | POA: Diagnosis not present

## 2016-09-11 DIAGNOSIS — Z90710 Acquired absence of both cervix and uterus: Secondary | ICD-10-CM | POA: Diagnosis not present

## 2016-09-11 DIAGNOSIS — C541 Malignant neoplasm of endometrium: Secondary | ICD-10-CM | POA: Diagnosis not present

## 2016-09-11 DIAGNOSIS — Z90722 Acquired absence of ovaries, bilateral: Secondary | ICD-10-CM | POA: Diagnosis not present

## 2016-09-11 NOTE — Progress Notes (Signed)
  Radiation Oncology         [phone]) [phone] ________________________________  Name: Traci Richardson MRN: [id number]  Date: 09/11/2016  DOB: 1925-11-24  CC: Reynold Bowen, MD  Everitt Amber, MD  HDR BRACHYTHERAPY NOTE  DIAGNOSIS: 83 y.o. woman with Stage IB grade I endometrial adenocarcinoma.    Simple treatment device note: Patient had construction of her custom vaginal cylinder. She will be treated with a  3 cm diameter segmented cylinder. This conforms to her anatomy without undue discomfort.  Vaginal brachytherapy procedure node: The patient was brought to the [place] suite. Identity was confirmed. All relevant records and images related to the planned course of therapy were reviewed. The patient freely provided informed written consent to proceed with treatment after reviewing the details related to the planned course of therapy. The consent form was witnessed and verified by the simulation staff. Then, the patient was set-up in a stable reproducible supine position for radiation therapy. Pelvic exam revealed the vaginal cuff to be intact. The patient's custom vaginal cylinder was placed in the proximal vagina. This was affixed to the CT/MR stabilization plate to prevent slippage. Patient tolerated the placement well.  Verification simulation note:  A fiducial marker was placed within the vaginal cylinder. An AP and lateral film was then obtained through the pelvis area. This documented accurate position of the vaginal cylinder for treatment.  HDR BRACHYTHERAPY TREATMENT  The remote afterloading device was affixed to the vaginal cylinder by catheter. Patient then proceeded to undergo her fourth high-dose-rate treatment directed at the proximal vagina. The patient was prescribed a dose of 6 gray to be delivered to the mucosal surface. Treatment length was 3.5 cm. Patient was treated with 1 channel using 8 dwell positions. Treatment time was 305.9 seconds. Iridium 192 was the high-dose-rate source for  treatment. The patient tolerated the treatment well. After completion of her therapy, a radiation survey was performed documenting return of the iridium source into the GammaMed safe.   PLAN: The patient will return in one week for her fifth treatment.  ________________________________ -----------------------------------  Blair Promise, PhD, MD  This document serves as a record of services personally performed by Gery Pray MD. It was created on his behalf by Delton Coombes, a trained medical scribe. The creation of this record is based on the scribe's personal observations and the provider's statements to them. This document has been checked and approved by the attending provider.

## 2016-09-12 ENCOUNTER — Encounter: Payer: Self-pay | Admitting: Radiation Oncology

## 2016-09-17 ENCOUNTER — Telehealth: Payer: Self-pay | Admitting: *Deleted

## 2016-09-17 NOTE — Telephone Encounter (Signed)
Called patient to remind of HDR Tx. for 09-18-16 @ 11 am, no answer.

## 2016-09-18 ENCOUNTER — Ambulatory Visit
Admission: RE | Admit: 2016-09-18 | Discharge: 2016-09-18 | Disposition: A | Discharge status: Home / Self Care | Payer: Medicare Other | Source: Ambulatory Visit | Attending: Radiation Oncology | Admitting: Radiation Oncology

## 2016-09-18 ENCOUNTER — Encounter: Payer: Self-pay | Admitting: Radiation Oncology

## 2016-09-18 DIAGNOSIS — C541 Malignant neoplasm of endometrium: Secondary | ICD-10-CM | POA: Diagnosis not present

## 2016-09-18 DIAGNOSIS — Z90710 Acquired absence of both cervix and uterus: Secondary | ICD-10-CM | POA: Diagnosis not present

## 2016-09-18 DIAGNOSIS — Z510 Encounter for antineoplastic radiation therapy: Secondary | ICD-10-CM | POA: Diagnosis not present

## 2016-09-18 DIAGNOSIS — Z90722 Acquired absence of ovaries, bilateral: Secondary | ICD-10-CM | POA: Diagnosis not present

## 2016-09-18 NOTE — Progress Notes (Signed)
  Radiation Oncology         [phone] ________________________________  Name: Traci Richardson MRN: [id number]  Date: 09/18/2016  DOB: 1925/08/18  CC: Reynold Bowen, MD  Everitt Amber, MD  HDR BRACHYTHERAPY NOTE  DIAGNOSIS: 83 y.o. woman with Stage IB grade I endometrial adenocarcinoma.   Simple treatment device note: Patient had construction of her custom vaginal cylinder. She will be treated with a 3 cm diameter segmented cylinder. This conforms to her anatomy without undue discomfort.  Vaginal brachytherapy procedure node: The patient was brought to the [place] suite. Identity was confirmed. All relevant records and images related to the planned course of therapy were reviewed. The patient freely provided informed written consent to proceed with treatment after reviewing the details related to the planned course of therapy. The consent form was witnessed and verified by the simulation staff. Then, the patient was set-up in a stable reproducible supine position for radiation therapy. Pelvic exam revealed the vaginal cuff to be intact. The patient's custom vaginal cylinder was placed in the proximal vagina. This was affixed to the CT/MR stabilization plate to prevent slippage. Patient tolerated the placement well.  Verification simulation note:  A fiducial marker was placed within the vaginal cylinder. An AP and lateral film was then obtained through the pelvis area. This documented accurate position of the vaginal cylinder for treatment.  HDR BRACHYTHERAPY TREATMENT  The remote afterloading device was affixed to the vaginal cylinder by catheter. Patient then proceeded to undergo her fifth high-dose-rate treatment directed at the proximal vagina. The patient was prescribed a dose of 6 gray to be delivered to the mucosal surface. Treatment length was 3.5 cm. Patient was treated with 1 channel using 7 dwell positions. Treatment time was 326.7 seconds. Iridium 192 was the high-dose-rate source for  treatment. The patient tolerated the treatment well. After completion of her therapy, a radiation survey was performed documenting return of the iridium source into the GammaMed safe.   PLAN: The patient has completed her treatments, she will return for a routine follow up in a month. ________________________________  -----------------------------------  Blair Promise, PhD, MD  This document serves as a record of services personally performed by Gery Pray MD. It was created on his behalf by Delton Coombes, a trained medical scribe. The creation of this record is based on the scribe's personal observations and the provider's statements to them. This document has been checked and approved by the attending provider.

## 2016-09-18 NOTE — Progress Notes (Signed)
  Radiation Oncology         [phone] ________________________________  Name: Traci Richardson MRN: [id number]  Date: 09/18/2016  DOB: 1925-07-15  End of Treatment Note  Diagnosis:   83 y.o. woman with Stage IB grade I endometrial adenocarcinoma.       Indication for treatment:  Curative       Radiation treatment dates:  08/21/16, 08/28/16, 09/04/16, 09/11/16, 09/18/16  Site/dose:   Vaginal cuff, 30 Gy in 5 fractions  Beams/energy:   Brachytherapy /  HDR Ir-192 Vaginal, 3 cm cylinder, 3.5 cm treatment length  Narrative: The patient tolerated radiation treatment relatively well.    Plan: The patient has completed radiation treatment. The patient will return to radiation oncology clinic for routine followup in one month. I advised them to call or return sooner if they have any questions or concerns related to their recovery or treatment.  -----------------------------------  Blair Promise, PhD, MD  This document serves as a record of services personally performed by Gery Pray MD. It was created on his behalf by Delton Coombes, a trained medical scribe. The creation of this record is based on the scribe's personal observations and the provider's statements to them. This document has been checked and approved by the attending provider.

## 2016-09-26 DIAGNOSIS — M1611 Unilateral primary osteoarthritis, right hip: Secondary | ICD-10-CM | POA: Diagnosis not present

## 2016-09-26 DIAGNOSIS — G8929 Other chronic pain: Secondary | ICD-10-CM | POA: Diagnosis not present

## 2016-09-26 DIAGNOSIS — M25562 Pain in left knee: Secondary | ICD-10-CM | POA: Diagnosis not present

## 2016-09-26 DIAGNOSIS — M25561 Pain in right knee: Secondary | ICD-10-CM | POA: Diagnosis not present

## 2016-09-26 DIAGNOSIS — M170 Bilateral primary osteoarthritis of knee: Secondary | ICD-10-CM | POA: Diagnosis not present

## 2016-10-01 DIAGNOSIS — M1611 Unilateral primary osteoarthritis, right hip: Secondary | ICD-10-CM | POA: Diagnosis not present

## 2016-10-17 ENCOUNTER — Encounter: Payer: Self-pay | Admitting: Oncology

## 2016-10-20 ENCOUNTER — Encounter: Payer: Self-pay | Admitting: Radiation Oncology

## 2016-10-20 ENCOUNTER — Ambulatory Visit
Admission: RE | Admit: 2016-10-20 | Discharge: 2016-10-20 | Disposition: A | Discharge status: Home / Self Care | Payer: Medicare Other | Source: Ambulatory Visit | Attending: Radiation Oncology | Admitting: Radiation Oncology

## 2016-10-20 DIAGNOSIS — Z08 Encounter for follow-up examination after completed treatment for malignant neoplasm: Secondary | ICD-10-CM | POA: Diagnosis not present

## 2016-10-20 DIAGNOSIS — Z90722 Acquired absence of ovaries, bilateral: Secondary | ICD-10-CM | POA: Diagnosis not present

## 2016-10-20 DIAGNOSIS — R350 Frequency of micturition: Secondary | ICD-10-CM | POA: Diagnosis not present

## 2016-10-20 DIAGNOSIS — C541 Malignant neoplasm of endometrium: Secondary | ICD-10-CM | POA: Diagnosis not present

## 2016-10-20 DIAGNOSIS — Z90710 Acquired absence of both cervix and uterus: Secondary | ICD-10-CM | POA: Diagnosis not present

## 2016-10-20 DIAGNOSIS — Z510 Encounter for antineoplastic radiation therapy: Secondary | ICD-10-CM | POA: Diagnosis not present

## 2016-10-20 NOTE — Progress Notes (Signed)

## 2016-10-20 NOTE — Progress Notes (Signed)
Traci Richardson is here for follow up.  She denies having pain.  She reports getting up every hour during the night but was doing this before radiation started.  She reports having constipation and is taking a stool softener every other day.  She denies having any vaginal bleeding/discharge.  She reports having a poor energy level.  She has been given a size S+ and M vaginal dilator.  BP 135/86 (BP Location: Right Arm, Patient Position: Sitting)   Pulse 80   Temp 97.8 F (36.6 C) (Oral)   Ht 5' (1.524 m)   Wt 193 lb 12.8 oz (87.9 kg)   SpO2 98%   BMI 37.85 kg/m    Wt Readings from Last 3 Encounters:  10/20/16 193 lb 12.8 oz (87.9 kg)  08/21/16 192 lb 6.4 oz (87.3 kg)  08/14/16 193 lb 6.4 oz (87.7 kg)

## 2016-10-20 NOTE — Progress Notes (Signed)
Radiation Oncology         [phone] ________________________________  Name: Traci Richardson MRN: [id number]  Date: 10/20/2016  DOB: [birth year]  Follow-Up Visit Note  CC: Reynold Bowen, MD  Everitt Amber, MD    ICD-10-CM   1. Endometrial adenocarcinoma (HCC) C54.1     Diagnosis: Stage IB grade I endometrial carcinoma   Interval Since Last Radiation:  1 months  Narrative:  The patient returns today for routine follow-up. She returns from a beach trip yesterday and had a great time overall. She reports that she still has ongoing fatigue which has made her pause occasionally. She otherwise has recovered well from both her radiation and surgery. She continues to have some urinary frequency, but she reports that this was a baseline issue prior to her course of radiation therapy and this has remained unchanged. She denies vaginal bleeding, pelvic pain, or any other associated symptoms.                             ALLERGIES:  is allergic to sulfonamide derivatives; codeine; tramadol; and latex.  Meds: Current Outpatient Prescriptions  Medication Sig Dispense Refill  . acetaminophen (TYLENOL) 500 MG tablet Take 500 mg by mouth daily as needed (for pain (in the afternoon as needed)).    Marland Kitchen aspirin EC 81 MG tablet Take 81 mg by mouth daily after breakfast.    . busPIRone (BUSPAR) 5 MG tablet Take 5 mg by mouth daily after breakfast.    . ezetimibe (ZETIA) 10 MG tablet Take 10 mg by mouth daily after breakfast.  6  . levothyroxine (SYNTHROID, LEVOTHROID) 100 MCG tablet Take 100 mcg by mouth daily before breakfast. On an empty stomach    . naproxen sodium (ANAPROX) 220 MG tablet Take 440 mg by mouth daily after breakfast.    . simvastatin (ZOCOR) 20 MG tablet Take 20 mg by mouth daily after breakfast.  6  . vitamin B-12 (CYANOCOBALAMIN) 500 MCG tablet Take 500 mcg by mouth daily after breakfast.    . Vitamin D, Ergocalciferol, (DRISDOL) 50000 units CAPS capsule Take 50,000 Units by mouth 2  (two) times a week. Tuesday & Thursday after breakfast  3  . Polyethyl Glycol-Propyl Glycol (SYSTANE ULTRA) 0.4-0.3 % SOLN Place 1-2 drops into both eyes daily as needed (for dry eyes).    . traMADol (ULTRAM) 50 MG tablet Take 1 tablet (50 mg total) by mouth every 6 (six) hours as needed. (Patient not taking: Reported on 10/20/2016) 10 tablet 0   No current facility-administered medications for this encounter.     Physical Findings: The patient is in no acute distress. Patient is alert and oriented.  height is 5' (1.524 m) and weight is 193 lb 12.8 oz (87.9 kg). Her oral temperature is 97.8 F (36.6 C). Her blood pressure is 135/86 and her pulse is 80. Her oxygen saturation is 98%. .  No significant changes. Lungs are clear to auscultation bilaterally. Heart has regular rate and rhythm. No palpable cervical, supraclavicular, or axillary adenopathy. Abdomen soft, non-tender, normal bowel sounds. Pelvic exam was deferred in light of recent completion of treatment.  Lab Findings: Lab Results  Component Value Date   WBC 9.9 07/18/2016   HGB 12.8 07/18/2016   HCT 39.0 07/18/2016   MCV 93.3 07/18/2016   PLT 176 07/18/2016    Radiographic Findings: No results found.  Impression:  The patient is recovering from the effects of radiation. No evidence  of disease on clinical exam. Pelvic exam was deferred secondary to her recently completing treamtent. She is clinically stable.   Plan:  Follow up with Dr Denman George in 53mo and f/u in radiation oncology in 54mo. The patient was given a vaginal dilator and instructions on that she use in light of her vaginal brachytherapy treatments  ____________________________________ Gery Pray, MD  This document serves as a record of services personally performed by Gery Pray, MD. It was created on his behalf by Reola Mosher, a trained medical scribe. The creation of this record is based on the scribe's personal observations and the provider's statements  to them. This document has been checked and approved by the attending provider.

## 2016-11-04 DIAGNOSIS — E559 Vitamin D deficiency, unspecified: Secondary | ICD-10-CM | POA: Diagnosis not present

## 2016-11-04 DIAGNOSIS — Z23 Encounter for immunization: Secondary | ICD-10-CM | POA: Diagnosis not present

## 2016-11-04 DIAGNOSIS — Z6830 Body mass index (BMI) 30.0-30.9, adult: Secondary | ICD-10-CM | POA: Diagnosis not present

## 2016-11-04 DIAGNOSIS — E042 Nontoxic multinodular goiter: Secondary | ICD-10-CM | POA: Diagnosis not present

## 2016-11-04 DIAGNOSIS — E7849 Other hyperlipidemia: Secondary | ICD-10-CM | POA: Diagnosis not present

## 2016-11-04 DIAGNOSIS — R030 Elevated blood-pressure reading, without diagnosis of hypertension: Secondary | ICD-10-CM | POA: Diagnosis not present

## 2016-11-04 DIAGNOSIS — C541 Malignant neoplasm of endometrium: Secondary | ICD-10-CM | POA: Diagnosis not present

## 2016-11-04 DIAGNOSIS — M810 Age-related osteoporosis without current pathological fracture: Secondary | ICD-10-CM | POA: Diagnosis not present

## 2016-11-04 DIAGNOSIS — E038 Other specified hypothyroidism: Secondary | ICD-10-CM | POA: Diagnosis not present

## 2016-11-04 DIAGNOSIS — Z1389 Encounter for screening for other disorder: Secondary | ICD-10-CM | POA: Diagnosis not present

## 2016-12-26 DIAGNOSIS — M1611 Unilateral primary osteoarthritis, right hip: Secondary | ICD-10-CM | POA: Diagnosis not present

## 2016-12-26 DIAGNOSIS — M48061 Spinal stenosis, lumbar region without neurogenic claudication: Secondary | ICD-10-CM | POA: Diagnosis not present

## 2016-12-26 DIAGNOSIS — M1712 Unilateral primary osteoarthritis, left knee: Secondary | ICD-10-CM | POA: Diagnosis not present

## 2017-03-26 ENCOUNTER — Encounter: Payer: Self-pay | Admitting: Radiation Oncology

## 2017-03-26 ENCOUNTER — Other Ambulatory Visit: Payer: Self-pay

## 2017-03-26 ENCOUNTER — Ambulatory Visit
Admission: RE | Admit: 2017-03-26 | Discharge: 2017-03-26 | Disposition: A | Discharge status: Home / Self Care | Payer: Medicare Other | Source: Ambulatory Visit | Attending: Radiation Oncology | Admitting: Radiation Oncology

## 2017-03-26 VITALS — BP 150/87 | HR 79 | Temp 97.5°F | Resp 18 | Wt 190.2 lb

## 2017-03-26 DIAGNOSIS — Z885 Allergy status to narcotic agent status: Secondary | ICD-10-CM | POA: Diagnosis not present

## 2017-03-26 DIAGNOSIS — F329 Major depressive disorder, single episode, unspecified: Secondary | ICD-10-CM | POA: Diagnosis not present

## 2017-03-26 DIAGNOSIS — C541 Malignant neoplasm of endometrium: Secondary | ICD-10-CM | POA: Diagnosis not present

## 2017-03-26 DIAGNOSIS — Z888 Allergy status to other drugs, medicaments and biological substances status: Secondary | ICD-10-CM | POA: Diagnosis not present

## 2017-03-26 DIAGNOSIS — F419 Anxiety disorder, unspecified: Secondary | ICD-10-CM | POA: Insufficient documentation

## 2017-03-26 DIAGNOSIS — Z79899 Other long term (current) drug therapy: Secondary | ICD-10-CM | POA: Diagnosis not present

## 2017-03-26 DIAGNOSIS — Z90710 Acquired absence of both cervix and uterus: Secondary | ICD-10-CM | POA: Insufficient documentation

## 2017-03-26 DIAGNOSIS — Z9049 Acquired absence of other specified parts of digestive tract: Secondary | ICD-10-CM | POA: Diagnosis not present

## 2017-03-26 DIAGNOSIS — E039 Hypothyroidism, unspecified: Secondary | ICD-10-CM | POA: Diagnosis not present

## 2017-03-26 DIAGNOSIS — Z882 Allergy status to sulfonamides status: Secondary | ICD-10-CM | POA: Insufficient documentation

## 2017-03-26 DIAGNOSIS — Z7982 Long term (current) use of aspirin: Secondary | ICD-10-CM | POA: Diagnosis not present

## 2017-03-26 DIAGNOSIS — Z923 Personal history of irradiation: Secondary | ICD-10-CM | POA: Diagnosis not present

## 2017-03-26 DIAGNOSIS — Z8542 Personal history of malignant neoplasm of other parts of uterus: Secondary | ICD-10-CM | POA: Diagnosis not present

## 2017-03-26 DIAGNOSIS — E7800 Pure hypercholesterolemia, unspecified: Secondary | ICD-10-CM | POA: Diagnosis not present

## 2017-03-26 DIAGNOSIS — Z808 Family history of malignant neoplasm of other organs or systems: Secondary | ICD-10-CM | POA: Diagnosis not present

## 2017-03-26 DIAGNOSIS — Z08 Encounter for follow-up examination after completed treatment for malignant neoplasm: Secondary | ICD-10-CM | POA: Diagnosis not present

## 2017-03-26 DIAGNOSIS — Z1239 Encounter for other screening for malignant neoplasm of breast: Secondary | ICD-10-CM

## 2017-03-26 DIAGNOSIS — Z79890 Hormone replacement therapy (postmenopausal): Secondary | ICD-10-CM | POA: Diagnosis not present

## 2017-03-26 NOTE — Progress Notes (Addendum)
Traci Richardson is here for her follow-up appointment. States that the only pain she is having is in her hip. Denies any fatigue. Denies any nausea or vomiting. Denies any vaginal or rectal bleeding. Denies any vaginal discharge. Denies any issues with her bowels. Denies any dysuria. Denies any issues with her skin. Blood pressure was elevated some denies any signs or symptoms of hypertension.States that she is not using her dilator. Vitals:   03/26/17 1106  BP: (!) 150/87  Pulse: 79  Resp: 18  Temp: (!) 97.5 F (36.4 C)  TempSrc: Oral  SpO2: 99%  Weight: 190 lb 4 oz (86.3 kg)   Wt Readings from Last 3 Encounters:  03/26/17 190 lb 4 oz (86.3 kg)  10/20/16 193 lb 12.8 oz (87.9 kg)  08/21/16 192 lb 6.4 oz (87.3 kg)

## 2017-03-27 ENCOUNTER — Other Ambulatory Visit: Payer: Self-pay | Admitting: Radiation Oncology

## 2017-03-27 DIAGNOSIS — Z1239 Encounter for other screening for malignant neoplasm of breast: Secondary | ICD-10-CM

## 2017-03-27 DIAGNOSIS — M25551 Pain in right hip: Secondary | ICD-10-CM | POA: Diagnosis not present

## 2017-03-27 DIAGNOSIS — M5136 Other intervertebral disc degeneration, lumbar region: Secondary | ICD-10-CM | POA: Diagnosis not present

## 2017-03-27 DIAGNOSIS — M25562 Pain in left knee: Secondary | ICD-10-CM | POA: Diagnosis not present

## 2017-03-27 NOTE — Progress Notes (Addendum)
Radiation Oncology         [phone] ________________________________  Name: Traci Richardson MRN: [id number]  Date: 03/26/2017  DOB: 11-30-1925  Follow-Up Visit Note  CC: Reynold Bowen, MD  Everitt Amber, MD    ICD-10-CM   1. Endometrial adenocarcinoma (HCC) C54.1     Diagnosis: Stage IB grade I endometrial carcinoma   Interval Since Last Radiation:  6  months   08/21/16-09/18/16:  Vaginal Cuff Brachytherapy to total 30 Gy in 5 fractions  Narrative:  Traci Richardson is a pleasant 84 y.o. female with a history of endometrial cancer. She was being worked up for PMB and initially thought to have a UTI, after continued symptoms despite treatment for this she ultimately underwent a D&C on 07/01/16 with Dr. Garwin Brothers which revealed grade 1 endometrioid adenocarcinoma. She subsequently underwent robotic hysterectomy with BSO on 07/17/17 which revealed a grade 1 endometrial adenocarcinoma spanning 4 cm, and invading 6 mm out of a myometrial thickeness of 1 cm. No involvement in her sentinel node was seen, and her adenxa was negative. She was counseled on adjuvant radiotherapy in the form of brachytherapy given the myometrial invasion. She completed therapy and comes today for post treatment surveillance.   On review of systems, the patient reports that she is doing well overall. She denies any chest pain, shortness of breath, cough, fevers, chills, night sweats, unintended weight changes. She denies any vaginal bleeding or discharge, and denies any bowel or bladder disturbances, and denies abdominal pain, nausea or vomiting. She denies any new musculoskeletal or joint aches or pains, new skin lesions or concerns. A complete review of systems is obtained and is otherwise negative.  Past Medical History:  Past Medical History:  Diagnosis Date  . Anxiety   . Arthritis   . Cancer [facility]) 2018   endometrial cancer  . Depression   . High cholesterol   . History of radiation therapy 08/21/16, 08/08/16, 09/04/16,  09/11/16, 09/18/16   Bracytherapy HDR to the vagina 30 Gy in 5 fractions  . Hypothyroidism   . Pneumonia 4-5 yrs ago  . UTI (urinary tract infection)     Past Surgical History: Past Surgical History:  Procedure Laterality Date  . CATARACT EXTRACTION, BILATERAL    . CHOLECYSTECTOMY     then a repair of wound opening 2 years later  . COLONOSCOPY    . DILATATION & CURETTAGE/HYSTEROSCOPY WITH MYOSURE N/A 07/01/2016   Procedure: DILATATION & CURETTAGE/HYSTEROSCOPY WITH MYOSURE;  Surgeon: Servando Salina, MD;  Location: [facility];  Service: Gynecology;  Laterality: N/A;  . LEFT KNEE SURGERY     meniscus repair  . ROBOTIC ASSISTED TOTAL HYSTERECTOMY WITH BILATERAL SALPINGO OOPHERECTOMY N/A 07/17/2016   Procedure: ROBOTIC ASSISTED TOTAL HYSTERECTOMY WITH BILATERAL SALPINGO OOPHORECTOMY AND LYSIS OF ADHESIONS;  Surgeon: Everitt Amber, MD;  Location: [facility];  Service: Gynecology;  Laterality: N/A;  . SENTINEL NODE BIOPSY N/A 07/17/2016   Procedure: SENTINEL NODE BIOPSY;  Surgeon: Everitt Amber, MD;  Location: [facility];  Service: Gynecology;  Laterality: N/A;    Social History:  Social History   Socioeconomic History  . Marital status: Widowed    Spouse name: Not on file  . Number of children: 2  . Years of education: Not on file  . Highest education level: Not on file  Occupational History  . Not on file  Social Needs  . Financial resource strain: Not on file  . Food insecurity:    Worry: Not on file    Inability: Not  on file  . Transportation needs:    Medical: Not on file    Non-medical: Not on file  Tobacco Use  . Smoking status: Never Smoker  . Smokeless tobacco: Never Used  Substance and Sexual Activity  . Alcohol use: No  . Drug use: No  . Sexual activity: Never  Lifestyle  . Physical activity:    Days per week: Not on file    Minutes per session: Not on file  . Stress: Not on file  Relationships  . Social connections:    Talks on phone: Not on file    Gets together: Not on  file    Attends religious service: Not on file    Active member of club or organization: Not on file    Attends meetings of clubs or organizations: Not on file    Relationship status: Not on file  . Intimate partner violence:    Fear of current or ex partner: Not on file    Emotionally abused: Not on file    Physically abused: Not on file    Forced sexual activity: Not on file  Other Topics Concern  . Not on file  Social History Narrative  . Not on file    Family History: Family History  Problem Relation Age of Onset  . Thyroid cancer Sister                               ALLERGIES:  is allergic to sulfonamide derivatives; codeine; tramadol; and latex.  Meds: Current Outpatient Medications  Medication Sig Dispense Refill  . acetaminophen (TYLENOL) 500 MG tablet Take 500 mg by mouth daily as needed (for pain (in the afternoon as needed)).    Marland Kitchen aspirin EC 81 MG tablet Take 81 mg by mouth daily after breakfast.    . busPIRone (BUSPAR) 5 MG tablet Take 5 mg by mouth daily after breakfast.    . ezetimibe (ZETIA) 10 MG tablet Take 10 mg by mouth daily after breakfast.  6  . levothyroxine (SYNTHROID, LEVOTHROID) 100 MCG tablet Take 100 mcg by mouth daily before breakfast. On an empty stomach    . naproxen sodium (ANAPROX) 220 MG tablet Take 440 mg by mouth daily after breakfast.    . Polyethyl Glycol-Propyl Glycol (SYSTANE ULTRA) 0.4-0.3 % SOLN Place 1-2 drops into both eyes daily as needed (for dry eyes).    . simvastatin (ZOCOR) 20 MG tablet Take 20 mg by mouth daily after breakfast.  6  . vitamin B-12 (CYANOCOBALAMIN) 500 MCG tablet Take 500 mcg by mouth daily after breakfast.    . Vitamin D, Ergocalciferol, (DRISDOL) 50000 units CAPS capsule Take 50,000 Units by mouth 2 (two) times a week. Tuesday & Thursday after breakfast  3  . traMADol (ULTRAM) 50 MG tablet Take 1 tablet (50 mg total) by mouth every 6 (six) hours as needed. (Patient not taking: Reported on 10/20/2016) 10 tablet 0    No current facility-administered medications for this encounter.     Physical Findings: Wt Readings from Last 3 Encounters:  03/26/17 190 lb 4 oz (86.3 kg)  10/20/16 193 lb 12.8 oz (87.9 kg)  08/21/16 192 lb 6.4 oz (87.3 kg)   Temp Readings from Last 3 Encounters:  03/26/17 (!) 97.5 F (36.4 C) (Oral)  10/20/16 97.8 F (36.6 C) (Oral)  08/21/16 98.2 F (36.8 C) (Oral)   BP Readings from Last 3 Encounters:  03/26/17 (!) 150/87  10/20/16  135/86  08/21/16 (!) 163/99   Pulse Readings from Last 3 Encounters:  03/26/17 79  10/20/16 80  08/21/16 80   In general this is a well appearing caucasian female in no acute distress. She is alert and oriented x4 and appropriate throughout the examination. HEENT reveals that the patient is normocephalic, atraumatic. EOMs are intact. PERRLA. Skin is intact without any evidence of gross lesions. Cardiovascular exam reveals a regular rate and rhythm, no clicks rubs or murmurs are auscultated. Chest is clear to auscultation bilaterally. Lymphatic assessment is performed and does not reveal any adenopathy in the cervical, supraclavicular, axillary, or inguinal chains. Abdomen has active bowel sounds in all quadrants and is intact. The abdomen is soft, non tender, non distended. Lower extremities are negative for pretibial pitting edema, deep calf tenderness, cyanosis or clubbing. Pelvic exam reveals normal appearing external female genitalia.  No gross  lesions are noted of the labial, perineal or perianal tissue.  BUS is normal in appearance.  Speculum exam reveals a well healed vaginal cuff without bleeding, lesions, or discharge. Bimanual examination reveals a smooth cuff without palpable masses.  No adnexal masses are noted.  Rectovaginal exam reveals smooth septum without induration or nodularity.    Lab Findings: Lab Results  Component Value Date   WBC 9.9 07/18/2016   HGB 12.8 07/18/2016   HCT 39.0 07/18/2016   MCV 93.3 07/18/2016   PLT 176  07/18/2016    Radiographic Findings: No results found.  Impression/Plan: 1.   Stage IB grade I endometrial carcinoma. The patient appears to be clinically without evidence of disease. She will follow up with Dr Denman George in 3 mo and f/u in radiation oncology in 6 months. The patient will continue her dilator use, and keep [place] informed of any questions or concerns prior to her next visit. 2. Breast Cancer Screening. The patient is not yet due for mammogram but will be this summer and since she will be having GYN care here for the next 5 years, we will proceed with ordering her mammogram. She will be due for a breast exam at her next evaluation.   The above documentation reflects our direct findings during this shared patient visit.    Carola Rhine, PAC  -----------------------------------  Blair Promise, PhD, MD

## 2017-03-30 ENCOUNTER — Telehealth: Payer: Self-pay | Admitting: *Deleted

## 2017-03-30 NOTE — Telephone Encounter (Signed)
Enid Derry from radiation called and scheduled a follow up appt for the patient with Dr. Denman George on June 28th at 3:30pm

## 2017-03-30 NOTE — Telephone Encounter (Signed)
CALLED PATIENT TO INFORM OF 6 MONTH FU WITH DR. KINARD ON 10-01-17 @ 9:30 AM AND HER FU WITH DR. Denman George ON 07/03/17 - ARRIVAL TIME - 3:15 PM, NO ANSWER WILL CALL LATER.

## 2017-03-31 ENCOUNTER — Other Ambulatory Visit: Payer: Self-pay | Admitting: Orthopedic Surgery

## 2017-03-31 DIAGNOSIS — E559 Vitamin D deficiency, unspecified: Secondary | ICD-10-CM | POA: Diagnosis not present

## 2017-03-31 DIAGNOSIS — E038 Other specified hypothyroidism: Secondary | ICD-10-CM | POA: Diagnosis not present

## 2017-03-31 DIAGNOSIS — E7849 Other hyperlipidemia: Secondary | ICD-10-CM | POA: Diagnosis not present

## 2017-03-31 DIAGNOSIS — C541 Malignant neoplasm of endometrium: Secondary | ICD-10-CM | POA: Diagnosis not present

## 2017-03-31 DIAGNOSIS — M810 Age-related osteoporosis without current pathological fracture: Secondary | ICD-10-CM | POA: Diagnosis not present

## 2017-03-31 DIAGNOSIS — Z1389 Encounter for screening for other disorder: Secondary | ICD-10-CM | POA: Diagnosis not present

## 2017-03-31 DIAGNOSIS — M5136 Other intervertebral disc degeneration, lumbar region: Secondary | ICD-10-CM

## 2017-03-31 DIAGNOSIS — E042 Nontoxic multinodular goiter: Secondary | ICD-10-CM | POA: Diagnosis not present

## 2017-04-01 NOTE — Progress Notes (Signed)
Phone call to patient to verify allergies and medication list. Pt informed to stop tramadol (which she says she is not taking) and buspar 48 hrs prior to myelogram procedure. Pt verbalized understanding.

## 2017-04-13 ENCOUNTER — Ambulatory Visit
Admission: RE | Admit: 2017-04-13 | Discharge: 2017-04-13 | Disposition: A | Discharge status: Home / Self Care | Payer: Medicare Other | Source: Ambulatory Visit | Attending: Orthopedic Surgery | Admitting: Orthopedic Surgery

## 2017-04-13 DIAGNOSIS — M48061 Spinal stenosis, lumbar region without neurogenic claudication: Secondary | ICD-10-CM | POA: Diagnosis not present

## 2017-04-13 DIAGNOSIS — M5136 Other intervertebral disc degeneration, lumbar region: Secondary | ICD-10-CM

## 2017-04-13 MED ORDER — DIAZEPAM 5 MG PO TABS
5.0000 mg | ORAL_TABLET | Freq: Once | ORAL | Status: AC
  Administered 2017-04-13: 5 mg via ORAL

## 2017-04-13 MED ORDER — IOPAMIDOL (ISOVUE-M 200) INJECTION 41%
15.0000 mL | Freq: Once | INTRAMUSCULAR | Status: AC
  Administered 2017-04-13: 15 mL via INTRATHECAL

## 2017-04-13 NOTE — Discharge Instructions (Signed)
Myelogram Discharge Instructions  1. Go home and rest quietly for the next 24 hours.  It is important to lie flat for the next 24 hours.  Get up only to go to the restroom.  You may lie in the bed or on a couch on your back, your stomach, your left side or your right side.  You may have one pillow under your head.  You may have pillows between your knees while you are on your side or under your knees while you are on your back.  2. DO NOT drive today.  Recline the seat as far back as it will go, while still wearing your seat belt, on the way home.  3. You may get up to go to the bathroom as needed.  You may sit up for 10 minutes to eat.  You may resume your normal diet and medications unless otherwise indicated.  Drink lots of extra fluids today and tomorrow.  4. The incidence of headache, nausea, or vomiting is about 5% (one in 20 patients).  If you develop a headache, lie flat and drink plenty of fluids until the headache goes away.  Caffeinated beverages may be helpful.  If you develop severe nausea and vomiting or a headache that does not go away with flat bed rest, call [phone].  5. You may resume normal activities after your 24 hours of bed rest is over; however, do not exert yourself strongly or do any heavy lifting tomorrow. If when you get up you have a headache when standing, go back to bed and force fluids for another 24 hours.  6. Call your physician for a follow-up appointment.  The results of your myelogram will be sent directly to your physician by the following day.  7. If you have any questions or if complications develop after you arrive home, please call [phone].  Discharge instructions have been explained to the patient.  The patient, or the person responsible for the patient, fully understands these instructions.  YOU MAY RESTART YOUR TRAMADOL AND BUSPAR TOMORROW 04/14/2017 AT 1:00PM.

## 2017-04-17 DIAGNOSIS — M48061 Spinal stenosis, lumbar region without neurogenic claudication: Secondary | ICD-10-CM | POA: Diagnosis not present

## 2017-05-22 DIAGNOSIS — M545 Low back pain: Secondary | ICD-10-CM | POA: Diagnosis not present

## 2017-05-22 DIAGNOSIS — M1712 Unilateral primary osteoarthritis, left knee: Secondary | ICD-10-CM | POA: Diagnosis not present

## 2017-05-22 DIAGNOSIS — M25551 Pain in right hip: Secondary | ICD-10-CM | POA: Diagnosis not present

## 2017-05-29 DIAGNOSIS — M25551 Pain in right hip: Secondary | ICD-10-CM | POA: Diagnosis not present

## 2017-07-03 ENCOUNTER — Inpatient Hospital Stay: Payer: Medicare Other | Attending: Gynecologic Oncology | Admitting: Gynecologic Oncology

## 2017-07-03 ENCOUNTER — Encounter: Payer: Self-pay | Admitting: Gynecologic Oncology

## 2017-07-03 VITALS — BP 147/100 | HR 77 | Temp 97.8°F | Resp 20 | Ht 62.0 in | Wt 189.1 lb

## 2017-07-03 DIAGNOSIS — Z90710 Acquired absence of both cervix and uterus: Secondary | ICD-10-CM | POA: Diagnosis not present

## 2017-07-03 DIAGNOSIS — Z923 Personal history of irradiation: Secondary | ICD-10-CM

## 2017-07-03 DIAGNOSIS — C541 Malignant neoplasm of endometrium: Secondary | ICD-10-CM | POA: Insufficient documentation

## 2017-07-03 DIAGNOSIS — Z90722 Acquired absence of ovaries, bilateral: Secondary | ICD-10-CM

## 2017-07-03 DIAGNOSIS — R350 Frequency of micturition: Secondary | ICD-10-CM | POA: Insufficient documentation

## 2017-07-03 NOTE — Progress Notes (Signed)
Follow-up Note: Gyn-Onc  Consult was requested by Dr. Garwin Brothers for the evaluation of Traci Richardson 84 y.o. female  CC:  Endometrial cancer follow-up Urinary frequency  Assessment/Plan:  Traci Richardson  is a 84 y.o.  year old with stage IB grade 1 endometriral adenocarcnoma.  High/intermediate risk factors for recurrence therefore she received vaginal brachytherapy for adjuvant treatment completed in September, 2018.    I recommend she follow-up at 3 monthly intervals for symptom review, physical examination and pelvic examination. Pap smear is not recommended in routine endometrial cancer surveillance. After 2 years we will space these visits to every 6 months, and then annually if recurrence has not developed within 5 years. All questions were answered.  Follow-up with Dr Sondra Come in 3 months and myself in 6 months.   Will have her check her BP at home and contact Dr Forde Dandy if the diastolic pressure remains >100.   HPI: Traci Richardson is an 84 year old who is seen in consultation at the request of Dr Garwin Brothers for grade 1 endometrial cancer.  The patient has a history of postmenopausal bleeding for 3 months. She was seen by Dr Garwin Brothers on 06/24/16 and [place] was performed which showed a 1.9cm right ovarian complex cyst, a 4mm thickened endometrium with a polyp and a normal uterus.   She went to the OR 09/01/26 and a hysteroscopy and polypectomy was performed. Final pathology showed a grade 1 endometrial cancer with CAH.  She is 84 years of age but of excellent general health. She has had a lap chole and open umbilical hernia repair with mesh. She has had 2 vaginal deliveries. Her daughter has a history of endometrial cancer, but there is no other cancer history in the family.   She takes ASA 81mg  daily.  On 07/17/16 she underwent robotic hysterectomy, BSO and SLN biopsy. Surgery was uncomplicated. Final pathology revealed a grade 1 cancer, with 4cm maximum size. The myometrial invasion was  0.6 of 1cm. There was no LVSI. A single left obturator SLN was negative. Due to high/intermediate risk factors noted in the endometrial tumor, vaginal brachytherapy adjuvant therapy was recommended in accordance with NCCN guidelines.  Interval Hx: She went on to receive vaginal brachytherapy. Treatment dates: 08/21/16-09/18/16: vaginal cuff brachytherapy to total 30 Gy in 5 fractions.  She has no complaints concerning for recurrent cancer. Her BP today is elevated (diastolic). She denies a headache.  Current Meds:  Outpatient Encounter Medications as of 07/03/2017  Medication Sig  . acetaminophen (TYLENOL) 500 MG tablet Take 500 mg by mouth daily as needed (for pain (in the afternoon as needed)).  Marland Kitchen aspirin EC 81 MG tablet Take 81 mg by mouth daily after breakfast.  . busPIRone (BUSPAR) 5 MG tablet Take 5 mg by mouth daily after breakfast.  . ezetimibe (ZETIA) 10 MG tablet Take 10 mg by mouth daily after breakfast.  . levothyroxine (SYNTHROID, LEVOTHROID) 100 MCG tablet Take 100 mcg by mouth daily before breakfast. On an empty stomach  . naproxen sodium (ANAPROX) 220 MG tablet Take 440 mg by mouth daily after breakfast.  . Polyethyl Glycol-Propyl Glycol (SYSTANE ULTRA) 0.4-0.3 % SOLN Place 1-2 drops into both eyes daily as needed (for dry eyes).  . simvastatin (ZOCOR) 20 MG tablet Take 20 mg by mouth daily after breakfast.  . traMADol (ULTRAM) 50 MG tablet Take 1 tablet (50 mg total) by mouth every 6 (six) hours as needed.  . vitamin B-12 (CYANOCOBALAMIN) 500 MCG tablet Take 500 mcg by  mouth daily after breakfast.  . Vitamin D, Ergocalciferol, (DRISDOL) 50000 units CAPS capsule Take 50,000 Units by mouth 2 (two) times a week. Tuesday & Thursday after breakfast   No facility-administered encounter medications on file as of 07/03/2017.     Allergy:  Allergies  Allergen Reactions  . Sulfonamide Derivatives Hives  . Codeine Other (See Comments)    Passes out  . Tramadol Other (See Comments)     Mood changes including confusion.  . Latex Itching and Rash    On hands w/latex gloves    Social Hx:   Social History   Socioeconomic History  . Marital status: Widowed    Spouse name: Not on file  . Number of children: 2  . Years of education: Not on file  . Highest education level: Not on file  Occupational History  . Not on file  Social Needs  . Financial resource strain: Not on file  . Food insecurity:    Worry: Not on file    Inability: Not on file  . Transportation needs:    Medical: Not on file    Non-medical: Not on file  Tobacco Use  . Smoking status: Never Smoker  . Smokeless tobacco: Never Used  Substance and Sexual Activity  . Alcohol use: No  . Drug use: No  . Sexual activity: Never  Lifestyle  . Physical activity:    Days per week: Not on file    Minutes per session: Not on file  . Stress: Not on file  Relationships  . Social connections:    Talks on phone: Not on file    Gets together: Not on file    Attends religious service: Not on file    Active member of club or organization: Not on file    Attends meetings of clubs or organizations: Not on file    Relationship status: Not on file  . Intimate partner violence:    Fear of current or ex partner: Not on file    Emotionally abused: Not on file    Physically abused: Not on file    Forced sexual activity: Not on file  Other Topics Concern  . Not on file  Social History Narrative  . Not on file    Past Surgical Hx:  Past Surgical History:  Procedure Laterality Date  . CATARACT EXTRACTION, BILATERAL    . CHOLECYSTECTOMY     then a repair of wound opening 2 years later  . COLONOSCOPY    . DILATATION & CURETTAGE/HYSTEROSCOPY WITH MYOSURE N/A 07/01/2016   Procedure: DILATATION & CURETTAGE/HYSTEROSCOPY WITH MYOSURE;  Surgeon: Servando Salina, MD;  Location: [facility];  Service: Gynecology;  Laterality: N/A;  . LEFT KNEE SURGERY     meniscus repair  . ROBOTIC ASSISTED TOTAL HYSTERECTOMY WITH  BILATERAL SALPINGO OOPHERECTOMY N/A 07/17/2016   Procedure: ROBOTIC ASSISTED TOTAL HYSTERECTOMY WITH BILATERAL SALPINGO OOPHORECTOMY AND LYSIS OF ADHESIONS;  Surgeon: Everitt Amber, MD;  Location: [facility];  Service: Gynecology;  Laterality: N/A;  . SENTINEL NODE BIOPSY N/A 07/17/2016   Procedure: SENTINEL NODE BIOPSY;  Surgeon: Everitt Amber, MD;  Location: [facility];  Service: Gynecology;  Laterality: N/A;    Past Medical Hx:  Past Medical History:  Diagnosis Date  . Anxiety   . Arthritis   . Cancer [facility]) 2018   endometrial cancer  . Depression   . High cholesterol   . History of radiation therapy 08/21/16, 08/08/16, 09/04/16, 09/11/16, 09/18/16   Bracytherapy HDR to the vagina 30 Gy  in 5 fractions  . Hypothyroidism   . Pneumonia 4-5 yrs ago  . UTI (urinary tract infection)     Past Gynecological History:   No LMP recorded. Patient is postmenopausal.  Family Hx:  Family History  Problem Relation Age of Onset  . Thyroid cancer Sister     Review of Systems:  Constitutional  Feels well,    ENT Normal appearing ears and nares bilaterally Skin/Breast  No rash, sores, jaundice, itching, dryness Cardiovascular  No chest pain, shortness of breath, or edema  Pulmonary  No cough or wheeze.  Gastro Intestinal  No nausea, vomitting, or diarrhoea. No bright red blood per rectum, no abdominal pain, change in bowel movement, or constipation.  Genito Urinary  + frequency, urgency, dysuria, no bleeding Musculo Skeletal  No myalgia, arthralgia, joint swelling or pain  Neurologic  No weakness, numbness, change in gait,  Psychology  No depression, anxiety, insomnia.   Vitals:  Blood pressure (!) 147/100, pulse 77, temperature 97.8 F (36.6 C), temperature source Oral, resp. rate 20, height 5\' 2"  (1.575 m), weight 189 lb 1.6 oz (85.8 kg), SpO2 97 %.  Physical Exam: WD in NAD Neck  Supple NROM, without any enlargements.  Lymph Node Survey No cervical supraclavicular or inguinal  adenopathy Cardiovascular  Pulse normal rate, regularity and rhythm. S1 and S2 normal.  Lungs  Clear to auscultation bilateraly, without wheezes/crackles/rhonchi. Good air movement.  Skin  No rash/lesions/breakdown  Psychiatry  Alert and oriented to person, place, and time  Abdomen  Normoactive bowel sounds, abdomen soft, non-tender and obese without evidence of hernia. Incisions soft.  Back No CVA tenderness Genito Urinary  Normal vaginal cuff. No masses or lesions. Rectal  deferred  Extremities  No bilateral cyanosis, clubbing or edema.  Thereasa Solo, MD  07/03/2017, 4:06 PM

## 2017-07-03 NOTE — Patient Instructions (Addendum)
Please notify Dr Denman George at phone number [phone] if you notice vaginal bleeding, new pelvic or abdominal pains, bloating, feeling full easy, or a change in bladder or bowel function.   Please return to see Dr Sondra Come in September, 2019 and Dr Denman George in December, 2019.   The last blood pressure recorded in Epic is 114/98 in August, 2018. Today's was 147/100

## 2017-07-10 DIAGNOSIS — M25561 Pain in right knee: Secondary | ICD-10-CM | POA: Diagnosis not present

## 2017-07-10 DIAGNOSIS — M1611 Unilateral primary osteoarthritis, right hip: Secondary | ICD-10-CM | POA: Diagnosis not present

## 2017-07-10 DIAGNOSIS — M7061 Trochanteric bursitis, right hip: Secondary | ICD-10-CM | POA: Diagnosis not present

## 2017-07-10 DIAGNOSIS — Z1231 Encounter for screening mammogram for malignant neoplasm of breast: Secondary | ICD-10-CM | POA: Diagnosis not present

## 2017-08-20 DIAGNOSIS — M25551 Pain in right hip: Secondary | ICD-10-CM | POA: Diagnosis not present

## 2017-08-21 DIAGNOSIS — M1611 Unilateral primary osteoarthritis, right hip: Secondary | ICD-10-CM | POA: Diagnosis not present

## 2017-09-01 DIAGNOSIS — M25551 Pain in right hip: Secondary | ICD-10-CM | POA: Diagnosis not present

## 2017-09-01 DIAGNOSIS — E038 Other specified hypothyroidism: Secondary | ICD-10-CM | POA: Diagnosis not present

## 2017-09-01 DIAGNOSIS — Z01818 Encounter for other preprocedural examination: Secondary | ICD-10-CM | POA: Diagnosis not present

## 2017-09-01 DIAGNOSIS — N390 Urinary tract infection, site not specified: Secondary | ICD-10-CM | POA: Diagnosis not present

## 2017-09-01 DIAGNOSIS — R3129 Other microscopic hematuria: Secondary | ICD-10-CM | POA: Diagnosis not present

## 2017-09-01 DIAGNOSIS — E7849 Other hyperlipidemia: Secondary | ICD-10-CM | POA: Diagnosis not present

## 2017-09-08 ENCOUNTER — Ambulatory Visit: Payer: Self-pay | Admitting: Orthopedic Surgery

## 2017-09-11 ENCOUNTER — Encounter (HOSPITAL_COMMUNITY): Payer: Self-pay

## 2017-09-11 NOTE — Patient Instructions (Addendum)
Traci Richardson  DOB  Dec 22, 1925   Your procedure is scheduled on:  09-17-2017   Report to [facility] Main  Entrance,  Report to admitting at 5:30 AM    Call this number if you have problems the morning of surgery [phone]                Remember: Do not eat food or drink liquids :After Midnight.     Take these medicines the morning of surgery with A SIP OF WATER:   BUSPIRONE(BUSPAR), EZETIMIBE(ZETIA), SIMVASTATIN(ZOCOR), LEVOTHYROXINE                                You may not have any metal on your body including hair pins and              piercings                Do not wear jewelry, make-up, lotions, powders or perfumes, deodorant                          Do not wear nail polish.  Do not shave  48 hours prior to surgery.     Do not bring valuables to the hospital. [place].  Contacts, dentures or bridgework may not be worn into surgery.  Leave suitcase in the car. After surgery it may be brought to your room.    Special Instructions: N/A              Please read over the following fact sheets you were given: _____________________________________________________________________                                                                                                                                            Incentive Spirometer  An incentive spirometer is a tool that can help keep your lungs clear and active. This tool measures how well you are filling your lungs with each breath. Taking long deep breaths may help reverse or decrease the chance of developing breathing (pulmonary) problems (especially infection) following:  A long period of time when you are unable to move or be active. BEFORE THE PROCEDURE   If the spirometer includes an indicator to show your best effort, your nurse or respiratory therapist will set it to a desired goal.  If possible, sit up straight or lean  slightly forward. Try not to slouch.  Hold the incentive spirometer in an upright position. INSTRUCTIONS FOR USE  1. Sit on the edge of your bed if possible, or sit up as far as you can in bed  or on a chair. 2. Hold the incentive spirometer in an upright position. 3. Breathe out normally. 4. Place the mouthpiece in your mouth and seal your lips tightly around it. 5. Breathe in slowly and as deeply as possible, raising the piston or the ball toward the top of the column. 6. Hold your breath for 3-5 seconds or for as long as possible. Allow the piston or ball to fall to the bottom of the column. 7. Remove the mouthpiece from your mouth and breathe out normally. 8. Rest for a few seconds and repeat Steps 1 through 7 at least 10 times every 1-2 hours when you are awake. Take your time and take a few normal breaths between deep breaths. 9. The spirometer may include an indicator to show your best effort. Use the indicator as a goal to work toward during each repetition. 10. After each set of 10 deep breaths, practice coughing to be sure your lungs are clear. If you have an incision (the cut made at the time of surgery), support your incision when coughing by placing a pillow or rolled up towels firmly against it. Once you are able to get out of bed, walk around indoors and cough well. You may stop using the incentive spirometer when instructed by your caregiver.  RISKS AND COMPLICATIONS  Take your time so you do not get dizzy or light-headed.  If you are in pain, you may need to take or ask for pain medication before doing incentive spirometry. It is harder to take a deep breath if you are having pain. AFTER USE  Rest and breathe slowly and easily.  It can be helpful to keep track of a log of your progress. Your caregiver can provide you with a simple table to help with this. If you are using the spirometer at home, follow these instructions: [place] IF:   You are having difficultly  using the spirometer.  You have trouble using the spirometer as often as instructed.  Your pain medication is not giving enough relief while using the spirometer.  You develop fever of 100.5 F (38.1 C) or higher. SEEK IMMEDIATE MEDICAL CARE IF:   You cough up bloody sputum that had not been present before.  You develop fever of 102 F (38.9 C) or greater.  You develop worsening pain at or near the incision site. MAKE SURE YOU:   Understand these instructions.  Will watch your condition.  Will get help right away if you are not doing well or get worse. Document Released: 05/05/2006 Document Revised: 03/17/2011 Document Reviewed: 07/06/2006 ExitCare Patient Information 2014 ExitCare, [place].   ____________________________________________________WHAT IS A BLOOD TRANSFUSION? Blood Transfusion Information  A transfusion is the replacement of blood or some of its parts. Blood is made up of multiple cells which provide different functions.  Red blood cells carry oxygen and are used for blood loss replacement.  White blood cells fight against infection.  Platelets control bleeding.  Plasma helps clot blood.  Other blood products are available for specialized needs, such as hemophilia or other clotting disorders. BEFORE THE TRANSFUSION  Who gives blood for transfusions?   Healthy volunteers who are fully evaluated to make sure their blood is safe. This is blood bank blood. Transfusion therapy is the safest it has ever been in the practice of medicine. Before blood is taken from a donor, a complete history is taken to make sure that person has no history of diseases nor engages in risky social behavior (examples  are intravenous drug use or sexual activity with multiple partners). The donor's travel history is screened to minimize risk of transmitting infections, such as malaria. The donated blood is tested for signs of infectious diseases, such as HIV and hepatitis. The blood is  then tested to be sure it is compatible with you in order to minimize the chance of a transfusion reaction. If you or a relative donates blood, this is often done in anticipation of surgery and is not appropriate for emergency situations. It takes many days to process the donated blood. RISKS AND COMPLICATIONS Although transfusion therapy is very safe and saves many lives, the main dangers of transfusion include:   Getting an infectious disease.  Developing a transfusion reaction. This is an allergic reaction to something in the blood you were given. Every precaution is taken to prevent this. The decision to have a blood transfusion has been considered carefully by your caregiver before blood is given. Blood is not given unless the benefits outweigh the risks. AFTER THE TRANSFUSION  Right after receiving a blood transfusion, you will usually feel much better and more energetic. This is especially true if your red blood cells have gotten low (anemic). The transfusion raises the level of the red blood cells which carry oxygen, and this usually causes an energy increase.  The nurse administering the transfusion will monitor you carefully for complications. HOME CARE INSTRUCTIONS  No special instructions are needed after a transfusion. You may find your energy is better. Speak with your caregiver about any limitations on activity for underlying diseases you may have. SEEK MEDICAL CARE IF:   Your condition is not improving after your transfusion.  You develop redness or irritation at the intravenous (IV) site. SEEK IMMEDIATE MEDICAL CARE IF:  Any of the following symptoms occur over the next 12 hours:  Shaking chills.  You have a temperature by mouth above 102 F (38.9 C), not controlled by medicine.  Chest, back, or muscle pain.  People around you feel you are not acting correctly or are confused.  Shortness of breath or difficulty breathing.  Dizziness and fainting.  You get a rash  or develop hives.  You have a decrease in urine output.  Your urine turns a dark color or changes to pink, red, or brown. Any of the following symptoms occur over the next 10 days:  You have a temperature by mouth above 102 F (38.9 C), not controlled by medicine.  Shortness of breath.  Weakness after normal activity.  The white part of the eye turns yellow (jaundice).  You have a decrease in the amount of urine or are urinating less often.  Your urine turns a dark color or changes to pink, red, or brown. Document Released: 12/21/1999 Document Revised: 03/17/2011 Document Reviewed: 08/09/2007 ExitCare Patient Information 2014 [place].  _______________________________________________________________________[facility] - Preparing for Surgery Before surgery, you can play an important role.  Because skin is not sterile, your skin needs to be as free of germs as possible.  You can reduce the number of germs on your skin by washing with CHG (chlorahexidine gluconate) soap before surgery.  CHG is an antiseptic cleaner which kills germs and bonds with the skin to continue killing germs even after washing. Please DO NOT use if you have an allergy to CHG or antibacterial soaps.  If your skin becomes reddened/irritated stop using the CHG and inform your nurse when you arrive at Short Stay. Do not shave (including legs and underarms) for at least  48 hours prior to the first CHG shower.  You may shave your face/neck. Please follow these instructions carefully:  1.  Shower with CHG Soap the night before surgery and the  morning of Surgery.  2.  If you choose to wash your hair, wash your hair first as usual with your  normal  shampoo.  3.  After you shampoo, rinse your hair and body thoroughly to remove the  shampoo.                                        4.  Use CHG as you would any other liquid soap.  You can apply chg directly  to the skin and wash                       Gently with a  scrungie or clean washcloth.  5.  Apply the CHG Soap to your body ONLY FROM THE NECK DOWN.   Do not use on face/ open                           Wound or open sores. Avoid contact with eyes, ears mouth and genitals (private parts).                       Wash face,  Genitals (private parts) with your normal soap.             6.  Wash thoroughly, paying special attention to the area where your surgery  will be performed.  7.  Thoroughly rinse your body with warm water from the neck down.  8.  DO NOT shower/wash with your normal soap after using and rinsing off  the CHG Soap.             9.  Pat yourself dry with a clean towel.            10.  Wear clean pajamas.            11.  Place clean sheets on your bed the night of your first shower and do not  sleep with pets. Day of Surgery : Do not apply any lotions/deodorants the morning of surgery.  Please wear clean clothes to the hospital/surgery center.  FAILURE TO FOLLOW THESE INSTRUCTIONS MAY RESULT IN THE CANCELLATION OF YOUR SURGERY PATIENT SIGNATURE_________________________________  NURSE SIGNATURE__________________________________  ________________________________________________________________________

## 2017-09-14 ENCOUNTER — Encounter (HOSPITAL_COMMUNITY): Payer: Self-pay

## 2017-09-14 ENCOUNTER — Other Ambulatory Visit: Payer: Self-pay

## 2017-09-14 ENCOUNTER — Other Ambulatory Visit: Payer: Self-pay | Admitting: Orthopedic Surgery

## 2017-09-14 ENCOUNTER — Encounter (HOSPITAL_COMMUNITY)
Admission: RE | Admit: 2017-09-14 | Discharge: 2017-09-14 | Disposition: A | Discharge status: Home / Self Care | Payer: Medicare Other | Source: Ambulatory Visit | Attending: Orthopedic Surgery | Admitting: Orthopedic Surgery

## 2017-09-14 DIAGNOSIS — Z01818 Encounter for other preprocedural examination: Secondary | ICD-10-CM | POA: Insufficient documentation

## 2017-09-14 DIAGNOSIS — M1611 Unilateral primary osteoarthritis, right hip: Secondary | ICD-10-CM | POA: Insufficient documentation

## 2017-09-14 HISTORY — DX: Other intervertebral disc degeneration, lumbosacral region: M51.37

## 2017-09-14 HISTORY — DX: Presence of spectacles and contact lenses: Z97.3

## 2017-09-14 HISTORY — DX: Presence of dental prosthetic device (complete) (partial): Z97.2

## 2017-09-14 HISTORY — DX: Personal history of other diseases of the digestive system: Z87.19

## 2017-09-14 HISTORY — DX: Malignant neoplasm of endometrium: C54.1

## 2017-09-14 HISTORY — DX: Hyperlipidemia, unspecified: E78.5

## 2017-09-14 HISTORY — DX: Other intervertebral disc degeneration, lumbosacral region without mention of lumbar back pain or lower extremity pain: M51.379

## 2017-09-14 HISTORY — DX: Nocturia: R35.1

## 2017-09-14 LAB — SURGICAL PCR SCREEN
MRSA, PCR: NEGATIVE
Staphylococcus aureus: NEGATIVE

## 2017-09-14 NOTE — Care Plan (Signed)
R THA scheduled on 09-17-17.   DCP:  Home to [place] apt at [facility] DME:  [place] ordered through [facility].  Has elevated toilets with grab bars. PT:  HEP

## 2017-09-14 NOTE — Progress Notes (Addendum)
Pt brought current lab results(CBCdiff,CMP)  and ekg dated 09-01-2017, placed in chart.  ADDENDUM:  Monday 09-14-2017 @ 1620 .  Reviewed pt EKG ,since it was not confirmed or sign by dr Forde Dandy, with dr rose mda.  Per dr rose EKG is OK to use, issue. Clearance received via fax from dr swinteck office dated 08-27-2017, dr Forde Dandy , with chart.

## 2017-09-15 NOTE — Progress Notes (Addendum)
PCR screening and Type and screen completed 09/15/17 results in epic PCR negative.

## 2017-09-17 ENCOUNTER — Inpatient Hospital Stay (HOSPITAL_COMMUNITY): Payer: Medicare Other

## 2017-09-17 ENCOUNTER — Inpatient Hospital Stay (HOSPITAL_COMMUNITY)
Admission: RE | Admit: 2017-09-17 | Discharge: 2017-09-21 | DRG: 470 | Disposition: A | Discharge status: Home Health | Payer: Medicare Other | Source: Ambulatory Visit | Attending: Orthopedic Surgery | Admitting: Orthopedic Surgery

## 2017-09-17 ENCOUNTER — Inpatient Hospital Stay (HOSPITAL_COMMUNITY): Payer: Medicare Other | Admitting: Certified Registered"

## 2017-09-17 ENCOUNTER — Encounter (HOSPITAL_COMMUNITY): Payer: Self-pay

## 2017-09-17 ENCOUNTER — Other Ambulatory Visit: Payer: Self-pay

## 2017-09-17 ENCOUNTER — Encounter (HOSPITAL_COMMUNITY)
Admission: RE | Disposition: A | Discharge status: Home Health | Payer: Self-pay | Source: Ambulatory Visit | Attending: Orthopedic Surgery

## 2017-09-17 DIAGNOSIS — Z973 Presence of spectacles and contact lenses: Secondary | ICD-10-CM

## 2017-09-17 DIAGNOSIS — Z9049 Acquired absence of other specified parts of digestive tract: Secondary | ICD-10-CM

## 2017-09-17 DIAGNOSIS — E785 Hyperlipidemia, unspecified: Secondary | ICD-10-CM | POA: Diagnosis present

## 2017-09-17 DIAGNOSIS — M1990 Unspecified osteoarthritis, unspecified site: Secondary | ICD-10-CM

## 2017-09-17 DIAGNOSIS — E669 Obesity, unspecified: Secondary | ICD-10-CM | POA: Diagnosis not present

## 2017-09-17 DIAGNOSIS — Z8542 Personal history of malignant neoplasm of other parts of uterus: Secondary | ICD-10-CM | POA: Diagnosis not present

## 2017-09-17 DIAGNOSIS — Z972 Presence of dental prosthetic device (complete) (partial): Secondary | ICD-10-CM | POA: Diagnosis not present

## 2017-09-17 DIAGNOSIS — Z90710 Acquired absence of both cervix and uterus: Secondary | ICD-10-CM

## 2017-09-17 DIAGNOSIS — Z91040 Latex allergy status: Secondary | ICD-10-CM

## 2017-09-17 DIAGNOSIS — Z6833 Body mass index (BMI) 33.0-33.9, adult: Secondary | ICD-10-CM | POA: Diagnosis not present

## 2017-09-17 DIAGNOSIS — M25751 Osteophyte, right hip: Secondary | ICD-10-CM | POA: Diagnosis present

## 2017-09-17 DIAGNOSIS — Z09 Encounter for follow-up examination after completed treatment for conditions other than malignant neoplasm: Secondary | ICD-10-CM

## 2017-09-17 DIAGNOSIS — Z90722 Acquired absence of ovaries, bilateral: Secondary | ICD-10-CM | POA: Diagnosis not present

## 2017-09-17 DIAGNOSIS — F419 Anxiety disorder, unspecified: Secondary | ICD-10-CM | POA: Diagnosis present

## 2017-09-17 DIAGNOSIS — Z96641 Presence of right artificial hip joint: Secondary | ICD-10-CM | POA: Diagnosis not present

## 2017-09-17 DIAGNOSIS — Z882 Allergy status to sulfonamides status: Secondary | ICD-10-CM

## 2017-09-17 DIAGNOSIS — E039 Hypothyroidism, unspecified: Secondary | ICD-10-CM | POA: Diagnosis not present

## 2017-09-17 DIAGNOSIS — Z9079 Acquired absence of other genital organ(s): Secondary | ICD-10-CM | POA: Diagnosis not present

## 2017-09-17 DIAGNOSIS — Z471 Aftercare following joint replacement surgery: Secondary | ICD-10-CM | POA: Diagnosis not present

## 2017-09-17 DIAGNOSIS — Z885 Allergy status to narcotic agent status: Secondary | ICD-10-CM | POA: Diagnosis not present

## 2017-09-17 DIAGNOSIS — Z961 Presence of intraocular lens: Secondary | ICD-10-CM | POA: Diagnosis not present

## 2017-09-17 DIAGNOSIS — M1611 Unilateral primary osteoarthritis, right hip: Secondary | ICD-10-CM | POA: Diagnosis not present

## 2017-09-17 DIAGNOSIS — Z9841 Cataract extraction status, right eye: Secondary | ICD-10-CM | POA: Diagnosis not present

## 2017-09-17 DIAGNOSIS — F329 Major depressive disorder, single episode, unspecified: Secondary | ICD-10-CM | POA: Diagnosis not present

## 2017-09-17 DIAGNOSIS — Z923 Personal history of irradiation: Secondary | ICD-10-CM | POA: Diagnosis not present

## 2017-09-17 DIAGNOSIS — Z9842 Cataract extraction status, left eye: Secondary | ICD-10-CM | POA: Diagnosis not present

## 2017-09-17 HISTORY — PX: TOTAL HIP ARTHROPLASTY: SHX124

## 2017-09-17 LAB — TYPE AND SCREEN
ABO/RH(D): O POS
Antibody Screen: NEGATIVE

## 2017-09-17 SURGERY — ARTHROPLASTY, HIP, TOTAL, ANTERIOR APPROACH
Anesthesia: Spinal | Site: Hip | Laterality: Right

## 2017-09-17 MED ORDER — METOCLOPRAMIDE HCL 5 MG/ML IJ SOLN: 10.0000 mg | Freq: Once | INTRAMUSCULAR | Status: DC | PRN

## 2017-09-17 MED ORDER — ALUM & MAG HYDROXIDE-SIMETH 200-200-20 MG/5ML PO SUSP: 30.0000 mL | ORAL | Status: DC | PRN

## 2017-09-17 MED ORDER — METOCLOPRAMIDE HCL 5 MG/ML IJ SOLN: 5.0000 mg | Freq: Three times a day (TID) | INTRAMUSCULAR | Status: DC | PRN

## 2017-09-17 MED ORDER — MENTHOL 3 MG MT LOZG: 1.0000 | LOZENGE | OROMUCOSAL | Status: DC | PRN

## 2017-09-17 MED ORDER — DEXAMETHASONE SODIUM PHOSPHATE 10 MG/ML IJ SOLN
10.0000 mg | Freq: Once | INTRAMUSCULAR | Status: DC
  Filled 2017-09-17: qty 1

## 2017-09-17 MED ORDER — CHLORHEXIDINE GLUCONATE 4 % EX LIQD: 60.0000 mL | Freq: Once | CUTANEOUS | Status: DC

## 2017-09-17 MED ORDER — SODIUM CHLORIDE 0.9 % IV SOLN
INTRAVENOUS | Status: DC | PRN
  Administered 2017-09-17: 10 ug/min via INTRAVENOUS

## 2017-09-17 MED ORDER — FENTANYL CITRATE (PF) 100 MCG/2ML IJ SOLN: 25.0000 ug | INTRAMUSCULAR | Status: DC | PRN

## 2017-09-17 MED ORDER — KETOROLAC TROMETHAMINE 30 MG/ML IJ SOLN
INTRAMUSCULAR | Status: DC | PRN
  Administered 2017-09-17: 30 mg

## 2017-09-17 MED ORDER — SODIUM CHLORIDE 0.9 % IV SOLN: INTRAVENOUS | Status: DC

## 2017-09-17 MED ORDER — CEFAZOLIN SODIUM-DEXTROSE 2-4 GM/100ML-% IV SOLN
2.0000 g | Freq: Four times a day (QID) | INTRAVENOUS | Status: AC
  Administered 2017-09-17 (×2): 2 g via INTRAVENOUS
  Filled 2017-09-17 (×2): qty 100

## 2017-09-17 MED ORDER — ONDANSETRON HCL 4 MG/2ML IJ SOLN
INTRAMUSCULAR | Status: DC | PRN
  Administered 2017-09-17: 4 mg via INTRAVENOUS

## 2017-09-17 MED ORDER — SODIUM CHLORIDE 0.9 % IV SOLN
INTRAVENOUS | Status: DC
  Administered 2017-09-17 – 2017-09-18 (×3): via INTRAVENOUS

## 2017-09-17 MED ORDER — BUSPIRONE HCL 5 MG PO TABS
5.0000 mg | ORAL_TABLET | Freq: Every day | ORAL | Status: DC
  Administered 2017-09-18 – 2017-09-21 (×4): 5 mg via ORAL
  Filled 2017-09-17 (×4): qty 1

## 2017-09-17 MED ORDER — PROPOFOL 10 MG/ML IV BOLUS
INTRAVENOUS | Status: AC
  Filled 2017-09-17: qty 20

## 2017-09-17 MED ORDER — POVIDONE-IODINE 10 % EX SWAB
2.0000 "application " | Freq: Once | CUTANEOUS | Status: AC
  Administered 2017-09-17: 2 via TOPICAL

## 2017-09-17 MED ORDER — SODIUM CHLORIDE 0.9 % IJ SOLN
INTRAMUSCULAR | Status: DC | PRN
  Administered 2017-09-17: 30 mL

## 2017-09-17 MED ORDER — FENTANYL CITRATE (PF) 100 MCG/2ML IJ SOLN
INTRAMUSCULAR | Status: AC
  Filled 2017-09-17: qty 2

## 2017-09-17 MED ORDER — PHENOL 1.4 % MT LIQD
1.0000 | OROMUCOSAL | Status: DC | PRN
  Filled 2017-09-17: qty 177

## 2017-09-17 MED ORDER — KETOROLAC TROMETHAMINE 15 MG/ML IJ SOLN
7.5000 mg | Freq: Four times a day (QID) | INTRAMUSCULAR | Status: AC
  Administered 2017-09-17 – 2017-09-18 (×4): 7.5 mg via INTRAVENOUS
  Filled 2017-09-17 (×4): qty 1

## 2017-09-17 MED ORDER — METHOCARBAMOL 500 MG PO TABS
500.0000 mg | ORAL_TABLET | Freq: Four times a day (QID) | ORAL | Status: DC | PRN
  Administered 2017-09-18 (×2): 500 mg via ORAL
  Filled 2017-09-17 (×2): qty 1

## 2017-09-17 MED ORDER — LACTATED RINGERS IV SOLN
INTRAVENOUS | Status: DC | PRN
  Administered 2017-09-17 (×2): via INTRAVENOUS

## 2017-09-17 MED ORDER — CEFAZOLIN SODIUM-DEXTROSE 2-4 GM/100ML-% IV SOLN
INTRAVENOUS | Status: AC
  Filled 2017-09-17: qty 100

## 2017-09-17 MED ORDER — SENNA 8.6 MG PO TABS
1.0000 | ORAL_TABLET | Freq: Two times a day (BID) | ORAL | Status: DC
  Administered 2017-09-17 – 2017-09-21 (×8): 8.6 mg via ORAL
  Filled 2017-09-17 (×9): qty 1

## 2017-09-17 MED ORDER — KETOROLAC TROMETHAMINE 30 MG/ML IJ SOLN
INTRAMUSCULAR | Status: AC
  Filled 2017-09-17: qty 1

## 2017-09-17 MED ORDER — METOCLOPRAMIDE HCL 5 MG PO TABS: 5.0000 mg | ORAL_TABLET | Freq: Three times a day (TID) | ORAL | Status: DC | PRN

## 2017-09-17 MED ORDER — BUPIVACAINE-EPINEPHRINE (PF) 0.25% -1:200000 IJ SOLN
INTRAMUSCULAR | Status: AC
  Filled 2017-09-17: qty 30

## 2017-09-17 MED ORDER — ONDANSETRON HCL 4 MG/2ML IJ SOLN: 4.0000 mg | Freq: Four times a day (QID) | INTRAMUSCULAR | Status: DC | PRN

## 2017-09-17 MED ORDER — ISOPROPYL ALCOHOL 70 % SOLN
Status: AC
  Filled 2017-09-17: qty 480

## 2017-09-17 MED ORDER — METHOCARBAMOL 500 MG IVPB - SIMPLE MED
500.0000 mg | Freq: Four times a day (QID) | INTRAVENOUS | Status: DC | PRN
  Administered 2017-09-17: 500 mg via INTRAVENOUS
  Filled 2017-09-17: qty 50

## 2017-09-17 MED ORDER — BUPIVACAINE IN DEXTROSE 0.75-8.25 % IT SOLN
INTRATHECAL | Status: DC | PRN
  Administered 2017-09-17: 1.6 mL via INTRATHECAL

## 2017-09-17 MED ORDER — SIMVASTATIN 20 MG PO TABS
20.0000 mg | ORAL_TABLET | Freq: Every day | ORAL | Status: DC
  Administered 2017-09-17 – 2017-09-21 (×5): 20 mg via ORAL
  Filled 2017-09-17 (×5): qty 1

## 2017-09-17 MED ORDER — BUPIVACAINE-EPINEPHRINE 0.25% -1:200000 IJ SOLN
INTRAMUSCULAR | Status: DC | PRN
  Administered 2017-09-17: 30 mL

## 2017-09-17 MED ORDER — EZETIMIBE 10 MG PO TABS
10.0000 mg | ORAL_TABLET | Freq: Every day | ORAL | Status: DC
  Administered 2017-09-18 – 2017-09-21 (×4): 10 mg via ORAL
  Filled 2017-09-17 (×4): qty 1

## 2017-09-17 MED ORDER — DOCUSATE SODIUM 100 MG PO CAPS
100.0000 mg | ORAL_CAPSULE | Freq: Two times a day (BID) | ORAL | Status: DC
  Administered 2017-09-17 – 2017-09-20 (×7): 100 mg via ORAL
  Filled 2017-09-17 (×7): qty 1

## 2017-09-17 MED ORDER — SUCCINYLCHOLINE CHLORIDE 200 MG/10ML IV SOSY
PREFILLED_SYRINGE | INTRAVENOUS | Status: AC
  Filled 2017-09-17: qty 10

## 2017-09-17 MED ORDER — TRANEXAMIC ACID 1000 MG/10ML IV SOLN
1000.0000 mg | INTRAVENOUS | Status: AC
  Administered 2017-09-17: 1000 mg via INTRAVENOUS

## 2017-09-17 MED ORDER — MORPHINE SULFATE (PF) 2 MG/ML IV SOLN: 0.5000 mg | INTRAVENOUS | Status: DC | PRN

## 2017-09-17 MED ORDER — FENTANYL CITRATE (PF) 250 MCG/5ML IJ SOLN
INTRAMUSCULAR | Status: DC | PRN
  Administered 2017-09-17 (×2): 25 ug via INTRAVENOUS

## 2017-09-17 MED ORDER — PHENYLEPHRINE HCL-NACL 10-0.9 MG/250ML-% IV SOLN
INTRAVENOUS | Status: AC
  Filled 2017-09-17: qty 250

## 2017-09-17 MED ORDER — LIP MEDEX EX OINT
TOPICAL_OINTMENT | CUTANEOUS | Status: AC
  Filled 2017-09-17: qty 7

## 2017-09-17 MED ORDER — ASPIRIN 81 MG PO CHEW
81.0000 mg | CHEWABLE_TABLET | Freq: Two times a day (BID) | ORAL | Status: DC
  Administered 2017-09-17 – 2017-09-21 (×8): 81 mg via ORAL
  Filled 2017-09-17 (×8): qty 1

## 2017-09-17 MED ORDER — ONDANSETRON HCL 4 MG/2ML IJ SOLN
INTRAMUSCULAR | Status: AC
  Filled 2017-09-17: qty 2

## 2017-09-17 MED ORDER — SODIUM CHLORIDE 0.9 % IR SOLN
Status: DC | PRN
  Administered 2017-09-17: 3000 mL
  Administered 2017-09-17: 1000 mL

## 2017-09-17 MED ORDER — POLYETHYLENE GLYCOL 3350 17 G PO PACK
17.0000 g | PACK | Freq: Every day | ORAL | Status: DC | PRN
  Administered 2017-09-20: 17 g via ORAL
  Filled 2017-09-17: qty 1

## 2017-09-17 MED ORDER — PROPOFOL 10 MG/ML IV BOLUS
INTRAVENOUS | Status: DC | PRN
  Administered 2017-09-17: 30 mg via INTRAVENOUS
  Administered 2017-09-17 (×4): 20 mg via INTRAVENOUS

## 2017-09-17 MED ORDER — WATER FOR IRRIGATION, STERILE IR SOLN
Status: DC | PRN
  Administered 2017-09-17: 2000 mL

## 2017-09-17 MED ORDER — EPHEDRINE 5 MG/ML INJ
INTRAVENOUS | Status: AC
  Filled 2017-09-17: qty 30

## 2017-09-17 MED ORDER — ACETAMINOPHEN 10 MG/ML IV SOLN
1000.0000 mg | INTRAVENOUS | Status: AC
  Administered 2017-09-17: 1000 mg via INTRAVENOUS
  Filled 2017-09-17: qty 100

## 2017-09-17 MED ORDER — CEFAZOLIN SODIUM-DEXTROSE 2-4 GM/100ML-% IV SOLN
2.0000 g | INTRAVENOUS | Status: AC
  Administered 2017-09-17: 2 g via INTRAVENOUS

## 2017-09-17 MED ORDER — METHOCARBAMOL 500 MG IVPB - SIMPLE MED
INTRAVENOUS | Status: AC
  Administered 2017-09-17: 500 mg via INTRAVENOUS
  Filled 2017-09-17: qty 50

## 2017-09-17 MED ORDER — LIDOCAINE 2% (20 MG/ML) 5 ML SYRINGE
INTRAMUSCULAR | Status: DC | PRN
  Administered 2017-09-17: 60 mg via INTRAVENOUS

## 2017-09-17 MED ORDER — FENTANYL CITRATE (PF) 250 MCG/5ML IJ SOLN
INTRAMUSCULAR | Status: AC
  Filled 2017-09-17: qty 5

## 2017-09-17 MED ORDER — LIDOCAINE 2% (20 MG/ML) 5 ML SYRINGE
INTRAMUSCULAR | Status: AC
  Filled 2017-09-17: qty 10

## 2017-09-17 MED ORDER — HYDROCODONE-ACETAMINOPHEN 5-325 MG PO TABS
1.0000 | ORAL_TABLET | ORAL | Status: DC | PRN
  Administered 2017-09-17 – 2017-09-18 (×6): 1 via ORAL
  Filled 2017-09-17 (×2): qty 1
  Filled 2017-09-17: qty 2
  Filled 2017-09-17 (×3): qty 1

## 2017-09-17 MED ORDER — MIDAZOLAM HCL 2 MG/2ML IJ SOLN
INTRAMUSCULAR | Status: AC
  Filled 2017-09-17: qty 2

## 2017-09-17 MED ORDER — MEPERIDINE HCL 50 MG/ML IJ SOLN: 6.2500 mg | INTRAMUSCULAR | Status: DC | PRN

## 2017-09-17 MED ORDER — PROPOFOL 500 MG/50ML IV EMUL
INTRAVENOUS | Status: DC | PRN
  Administered 2017-09-17: 25 ug/kg/min via INTRAVENOUS

## 2017-09-17 MED ORDER — ONDANSETRON HCL 4 MG PO TABS: 4.0000 mg | ORAL_TABLET | Freq: Four times a day (QID) | ORAL | Status: DC | PRN

## 2017-09-17 MED ORDER — PROPOFOL 10 MG/ML IV BOLUS
INTRAVENOUS | Status: AC
  Filled 2017-09-17: qty 40

## 2017-09-17 MED ORDER — SODIUM CHLORIDE 0.9 % IJ SOLN
INTRAMUSCULAR | Status: AC
  Filled 2017-09-17: qty 50

## 2017-09-17 MED ORDER — TRANEXAMIC ACID 1000 MG/10ML IV SOLN
INTRAVENOUS | Status: AC
  Filled 2017-09-17: qty 10

## 2017-09-17 MED ORDER — ACETAMINOPHEN 325 MG PO TABS
325.0000 mg | ORAL_TABLET | Freq: Four times a day (QID) | ORAL | Status: DC | PRN
  Administered 2017-09-18 – 2017-09-21 (×6): 650 mg via ORAL
  Filled 2017-09-17 (×6): qty 2

## 2017-09-17 MED ORDER — HYDROCODONE-ACETAMINOPHEN 7.5-325 MG PO TABS
1.0000 | ORAL_TABLET | ORAL | Status: DC | PRN
  Administered 2017-09-19: 1 via ORAL
  Filled 2017-09-17: qty 1

## 2017-09-17 MED ORDER — ISOPROPYL ALCOHOL 70 % SOLN
Status: DC | PRN
  Administered 2017-09-17: 1 via TOPICAL

## 2017-09-17 MED ORDER — POLYVINYL ALCOHOL 1.4 % OP SOLN: 1.0000 [drp] | Freq: Every day | OPHTHALMIC | Status: DC | PRN

## 2017-09-17 MED ORDER — DIPHENHYDRAMINE HCL 12.5 MG/5ML PO ELIX: 12.5000 mg | ORAL_SOLUTION | ORAL | Status: DC | PRN

## 2017-09-17 MED ORDER — LEVOTHYROXINE SODIUM 100 MCG PO TABS
100.0000 ug | ORAL_TABLET | Freq: Every day | ORAL | Status: DC
  Administered 2017-09-17 – 2017-09-21 (×5): 100 ug via ORAL
  Filled 2017-09-17 (×5): qty 1

## 2017-09-17 SURGICAL SUPPLY — 53 items
ADH SKN CLS APL DERMABOND .7 (GAUZE/BANDAGES/DRESSINGS) ×2
BAG DECANTER FOR FLEXI CONT (MISCELLANEOUS) IMPLANT
BAG SPEC THK2 15X12 ZIP CLS (MISCELLANEOUS)
BAG ZIPLOCK 12X15 (MISCELLANEOUS) IMPLANT
CHLORAPREP W/TINT 26ML (MISCELLANEOUS) ×2 IMPLANT
CLOTH BEACON ORANGE TIMEOUT ST (SAFETY) ×2 IMPLANT
COVER PERINEAL POST (MISCELLANEOUS) ×2 IMPLANT
COVER SURGICAL LIGHT HANDLE (MISCELLANEOUS) ×2 IMPLANT
DECANTER SPIKE VIAL GLASS SM (MISCELLANEOUS) ×2 IMPLANT
DERMABOND ADVANCED (GAUZE/BANDAGES/DRESSINGS) ×2
DERMABOND ADVANCED .7 DNX12 (GAUZE/BANDAGES/DRESSINGS) ×2 IMPLANT
DRAPE SHEET LG 3/4 BI-LAMINATE (DRAPES) ×6 IMPLANT
DRAPE STERI IOBAN 125X83 (DRAPES) ×2 IMPLANT
DRAPE U-SHAPE 47X51 STRL (DRAPES) ×4 IMPLANT
DRSG AQUACEL AG ADV 3.5X10 (GAUZE/BANDAGES/DRESSINGS) ×2 IMPLANT
ELECT PENCIL ROCKER SW 15FT (MISCELLANEOUS) ×2 IMPLANT
ELECT REM PT RETURN 15FT ADLT (MISCELLANEOUS) ×2 IMPLANT
GAUZE SPONGE 4X4 12PLY STRL (GAUZE/BANDAGES/DRESSINGS) ×2 IMPLANT
GLOVE BIOGEL PI IND STRL 8.5 (GLOVE) ×1 IMPLANT
GLOVE BIOGEL PI INDICATOR 8.5 (GLOVE) ×1
GLOVE SURG SS PI 6.5 STRL IVOR (GLOVE) ×4 IMPLANT
GLOVE SURG SS PI 8.5 STRL IVOR (GLOVE) ×2
GLOVE SURG SS PI 8.5 STRL STRW (GLOVE) ×2 IMPLANT
GOWN SPEC L3 XXLG W/TWL (GOWN DISPOSABLE) ×2 IMPLANT
HANDPIECE INTERPULSE COAX TIP (DISPOSABLE) ×2
HEAD M SROM 36MM PLUS 1.5 (Hips) ×1 IMPLANT
HOLDER FOLEY CATH W/STRAP (MISCELLANEOUS) ×2 IMPLANT
HOOD PEEL AWAY FLYTE STAYCOOL (MISCELLANEOUS) ×8 IMPLANT
LINER ACETAB NEUTRAL 36ID 520D (Liner) ×2 IMPLANT
MARKER SKIN DUAL TIP RULER LAB (MISCELLANEOUS) ×2 IMPLANT
NEEDLE SPNL 18GX3.5 QUINCKE PK (NEEDLE) ×2 IMPLANT
PACK ANTERIOR HIP CUSTOM (KITS) ×2 IMPLANT
PIN SECTOR W/GRIP ACE CUP 52MM (Hips) ×2 IMPLANT
SAW OSC TIP CART 19.5X105X1.3 (SAW) ×2 IMPLANT
SEALER BIPOLAR AQUA 6.0 (INSTRUMENTS) ×2 IMPLANT
SET HNDPC FAN SPRY TIP SCT (DISPOSABLE) ×1 IMPLANT
SROM M HEAD 36MM PLUS 1.5 (Hips) ×2 IMPLANT
STEM TRI LOC BPS SZ7 W GRIPTON (Hips) ×1 IMPLANT
SUT ETHIBOND NAB CT1 #1 30IN (SUTURE) ×4 IMPLANT
SUT MNCRL 0 MO-4 VIOLET 18 CR (SUTURE) ×1 IMPLANT
SUT MNCRL AB 3-0 PS2 18 (SUTURE) ×2 IMPLANT
SUT MON AB 2-0 CT1 36 (SUTURE) ×4 IMPLANT
SUT MONOCRYL 0 MO 4 18  CR/8 (SUTURE) ×1
SUT STRATAFIX PDO 1 14 VIOLET (SUTURE) ×2
SUT STRATFX PDO 1 14 VIOLET (SUTURE) ×1
SUT VIC AB 2-0 CT1 27 (SUTURE) ×2
SUT VIC AB 2-0 CT1 TAPERPNT 27 (SUTURE) ×1 IMPLANT
SUTURE STRATFX PDO 1 14 VIOLET (SUTURE) ×1 IMPLANT
SYR 50ML LL SCALE MARK (SYRINGE) ×2 IMPLANT
TRAY FOLEY BAG SILVER LF 16FR (CATHETERS) ×2 IMPLANT
TRI LOC BPS SZ 7 W GRIPTON (Hips) ×2 IMPLANT
WATER STERILE IRR 1000ML POUR (IV SOLUTION) ×2 IMPLANT
YANKAUER SUCT BULB TIP 10FT TU (MISCELLANEOUS) ×2 IMPLANT

## 2017-09-17 NOTE — Discharge Instructions (Signed)
°Dr. Keane Martelli °Joint Replacement Specialist °[facility] °[address] °, [place] [address] °[phone] ° ° °TOTAL HIP REPLACEMENT POSTOPERATIVE DIRECTIONS ° ° ° °Hip Rehabilitation, Guidelines Following Surgery  ° °WEIGHT BEARING °Weight bearing as tolerated with assist device (walker, cane, etc) as directed, use it as long as suggested by your surgeon or therapist, typically at least 4-6 weeks. ° °The results of a hip operation are greatly improved after range of motion and muscle strengthening exercises. Follow all safety measures which are given to protect your hip. If any of these exercises cause increased pain or swelling in your joint, decrease the amount until you are comfortable again. Then slowly increase the exercises. Call your caregiver if you have problems or questions.  ° °HOME CARE INSTRUCTIONS  °Most of the following instructions are designed to prevent the dislocation of your new hip.  °Remove items at home which could result in a fall. This includes throw rugs or furniture in walking pathways.  °Continue medications as instructed at time of discharge. °· You may have some home medications which will be placed on hold until you complete the course of blood thinner medication. °· You may start showering once you are discharged home. Do not remove your dressing. °Do not put on socks or shoes without following the instructions of your caregivers.   °Sit on chairs with arms. Use the chair arms to help push yourself up when arising.  °Arrange for the use of a toilet seat elevator so you are not sitting low.  °· Walk with walker as instructed.  °You may resume a sexual relationship in one month or when given the OK by your caregiver.  °Use walker as long as suggested by your caregivers.  °You may put full weight on your legs and walk as much as is comfortable. °Avoid periods of inactivity such as sitting longer than an hour when not asleep. This helps prevent  blood clots.  °You may return to work once you are cleared by your surgeon.  °Do not drive a car for 6 weeks or until released by your surgeon.  °Do not drive while taking narcotics.  °Wear elastic stockings for two weeks following surgery during the day but you may remove then at night.  °Make sure you keep all of your appointments after your operation with all of your doctors and caregivers. You should call the office at the above phone number and make an appointment for approximately two weeks after the date of your surgery. °Please pick up a stool softener and laxative for home use as long as you are requiring pain medications. °· ICE to the affected hip every three hours for 30 minutes at a time and then as needed for pain and swelling. Continue to use ice on the hip for pain and swelling from surgery. You may notice swelling that will progress down to the foot and ankle.  This is normal after surgery.  Elevate the leg when you are not up walking on it.   °It is important for you to complete the blood thinner medication as prescribed by your doctor. °· Continue to use the breathing machine which will help keep your temperature down.  It is common for your temperature to cycle up and down following surgery, especially at night when you are not up moving around and exerting yourself.  The breathing machine keeps your lungs expanded and your temperature down. ° °RANGE OF MOTION AND STRENGTHENING EXERCISES  °These exercises are   designed to help you keep full movement of your hip joint. Follow your caregiver's or physical therapist's instructions. Perform all exercises about fifteen times, three times per day or as directed. Exercise both hips, even if you have had only one joint replacement. These exercises can be done on a training (exercise) mat, on the floor, on a table or on a bed. Use whatever works the best and is most comfortable for you. Use music or television while you are exercising so that the exercises  are a pleasant break in your day. This will make your life better with the exercises acting as a break in routine you can look forward to.  °Lying on your back, slowly slide your foot toward your buttocks, raising your knee up off the floor. Then slowly slide your foot back down until your leg is straight again.  °Lying on your back spread your legs as far apart as you can without causing discomfort.  °Lying on your side, raise your upper leg and foot straight up from the floor as far as is comfortable. Slowly lower the leg and repeat.  °Lying on your back, tighten up the muscle in the front of your thigh (quadriceps muscles). You can do this by keeping your leg straight and trying to raise your heel off the floor. This helps strengthen the largest muscle supporting your knee.  °Lying on your back, tighten up the muscles of your buttocks both with the legs straight and with the knee bent at a comfortable angle while keeping your heel on the floor.  ° °SKILLED REHAB INSTRUCTIONS: °If the patient is transferred to a skilled rehab facility following release from the hospital, a list of the current medications will be sent to the facility for the patient to continue.  When discharged from the skilled rehab facility, please have the facility set up the patient's Home Health Physical Therapy prior to being released. Also, the skilled facility will be responsible for providing the patient with their medications at time of release from the facility to include their pain medication and their blood thinner medication. If the patient is still at the rehab facility at time of the two week follow up appointment, the skilled rehab facility will also need to assist the patient in arranging follow up appointment in our office and any transportation needs. ° °MAKE SURE YOU:  °Understand these instructions.  °Will watch your condition.  °Will get help right away if you are not doing well or get worse. ° °Pick up stool softner and  laxative for home use following surgery while on pain medications. °Do not remove your dressing. °The dressing is waterproof--it is OK to take showers. °Continue to use ice for pain and swelling after surgery. °Do not use any lotions or creams on the incision until instructed by your surgeon. °Total Hip Protocol. ° ° °

## 2017-09-17 NOTE — Anesthesia Postprocedure Evaluation (Signed)
Anesthesia Post Note  Patient: Traci Richardson  Procedure(s) Performed: RIGHT TOTAL HIP ARTHROPLASTY ANTERIOR APPROACH (Right Hip)     Patient location during evaluation: PACU Anesthesia Type: Spinal Level of consciousness: awake and alert Pain management: pain level controlled Vital Signs Assessment: post-procedure vital signs reviewed and stable Respiratory status: spontaneous breathing and respiratory function stable Cardiovascular status: blood pressure returned to baseline and stable Postop Assessment: no headache, no backache, spinal receding and no apparent nausea or vomiting Anesthetic complications: no    Last Vitals:  Vitals:   09/17/17 1247 09/17/17 1401  BP: (!) 151/75 (!) 145/83  Pulse: 60 67  Resp:  15  Temp: (!) 36.4 C 36.9 C  SpO2: 100% 100%    Last Pain:  Vitals:   09/17/17 1434  TempSrc:   PainSc: 4                  Montez Hageman

## 2017-09-17 NOTE — Evaluation (Addendum)
Physical Therapy Evaluation Patient Details Name: Traci Richardson MRN: [id number] DOB: [birth year] Today's Date: 09/17/2017   History of Present Illness  Pt is a 84 YO female s/p R DA-THA on 09/17/17. PMH includes endometrial cancer 2015, AMS, fever, lumbar compression fracture, anxiety, DDD. Surgical history includes cataracts, L knee scope, lap chole 1990s  Clinical Impression   Pt is s/p R DA-THA. Pt presents with R hip pain, difficulty performing bed mobility/transfers/ambulation, decreased tolerance for mobility, and LE weakness. Pt to benefit from acute PT. Pt ambulated very short distance in the room, and was limited by difficulty moving and placing RLE within the RW. Pt also noted feeling "woozy" after standing, but vitals were WNL (see flowsheets). Pt will continue to progress mobility and follow acutely.     Follow Up Recommendations Follow surgeon's recommendation for DC plan and follow-up therapies;Supervision for mobility/OOB    Equipment Recommendations  Rolling walker with 5" wheels    Recommendations for Other Services       Precautions / Restrictions Precautions Precautions: Fall Restrictions Weight Bearing Restrictions: No Other Position/Activity Restrictions: WBAT       Mobility  Bed Mobility Overal bed mobility: Needs Assistance Bed Mobility: Supine to Sit     Supine to sit: Mod assist;HOB elevated     General bed mobility comments: Mod assist for LE management, trunk elevation, scooting to EOB with bed pad. Verbal cuing throughout.   Transfers Overall transfer level: Needs assistance Equipment used: Rolling walker (2 wheeled) Transfers: Sit to/from Stand Sit to Stand: Min assist         General transfer comment: Min assist for power up, steadying RW and pt. Verbal cuing for hand placement on RW.  Ambulation/Gait Ambulation/Gait assistance: Mod assist Gait Distance (Feet): 3 Feet Assistive device: Rolling walker (2 wheeled) Gait  Pattern/deviations: Step-to pattern;Wide base of support;Decreased stride length;Decreased weight shift to right;Decreased stance time - right;Trunk flexed Gait velocity: very decr, multiple pauses    General Gait Details: Pt with wide stance upon standing, verbal and tactile cuing for pt to bring RLE towards midline. Pt with limited weight shifting to RLE when practicing standing weight shifts L and R. Mod assist for trunk support, steadying, and facilitation of forward progression of RLE.   Stairs            Wheelchair Mobility    Modified Rankin (Stroke Patients Only)       Balance Overall balance assessment: Needs assistance Sitting-balance support: Bilateral upper extremity supported;Feet supported Sitting balance-Leahy Scale: Fair Sitting balance - Comments: can sit without UE use, prefers not to. Cannot tolerate challenge to balance.      Standing balance-Leahy Scale: Poor Standing balance comment: relies on RW and PT support in standing                             Pertinent Vitals/Pain Pain Assessment: 0-10 Pain Score: 3  Pain Descriptors / Indicators: Aching;Sore Pain Intervention(s): Limited activity within patient's tolerance;Ice applied;Monitored during session;Premedicated before session;Repositioned    Home Living Family/patient expects to be discharged to:: Assisted living Living Arrangements: Alone Available Help at Discharge: Family;Available PRN/intermittently(care if needed at ALF ) Type of Home: Apartment(Lives in assisted living facility [facility]), pt lives in the independent section ) Home Access: Level entry     Home Layout: One level Home Equipment: Grab bars - tub/shower;Grab bars - toilet;Walker - 4 wheels;Cane - single point      Prior  Function Level of Independence: Independent with assistive device(s)         Comments: uses rollator all the time, pt states that she cannot walk without it. Independent with ADLs.       Hand Dominance   Dominant Hand: Right    Extremity/Trunk Assessment   Upper Extremity Assessment Upper Extremity Assessment: Generalized weakness    Lower Extremity Assessment Lower Extremity Assessment: Generalized weakness(suspected R hip weakness>L hip weakness due to surgery. Able to perform quad sets x5, ankle pumps)    Cervical / Trunk Assessment Cervical / Trunk Assessment: Kyphotic  Communication   Communication: No difficulties  Cognition Arousal/Alertness: Awake/alert Behavior During Therapy: WFL for tasks assessed/performed Overall Cognitive Status: Within Functional Limits for tasks assessed                                        General Comments      Exercises Total Joint Exercises Ankle Circles/Pumps: AROM;Both;10 reps;Supine Quad Sets: AROM;Right;5 reps;Supine   Assessment/Plan    PT Assessment Patient needs continued PT services  PT Problem List Decreased strength;Pain;Decreased range of motion;Decreased balance;Decreased mobility;Decreased safety awareness;Decreased knowledge of use of DME;Decreased activity tolerance       PT Treatment Interventions DME instruction;Therapeutic activities;Patient/family education;Therapeutic exercise;Gait training;Balance training;Functional mobility training    PT Goals (Current goals can be found in the Care Plan section)  Acute Rehab PT Goals PT Goal Formulation: With patient Time For Goal Achievement: 10/01/17 Potential to Achieve Goals: Good    Frequency 7X/week   Barriers to discharge        Co-evaluation               AM-PAC PT "6 Clicks" Daily Activity  Outcome Measure Difficulty turning over in bed (including adjusting bedclothes, sheets and blankets)?: Unable Difficulty moving from lying on back to sitting on the side of the bed? : Unable Difficulty sitting down on and standing up from a chair with arms (e.g., wheelchair, bedside commode, etc,.)?: Unable Help needed  moving to and from a bed to chair (including a wheelchair)?: A Little Help needed walking in hospital room?: A Lot Help needed climbing 3-5 steps with a railing? : A Lot 6 Click Score: 10    End of Session Equipment Utilized During Treatment: Gait belt Activity Tolerance: Patient tolerated treatment well;Patient limited by fatigue Patient left: in chair;with chair alarm set;with call bell/phone within reach;with family/visitor present;with SCD's reapplied Nurse Communication: Mobility status PT Visit Diagnosis: Unsteadiness on feet (R26.81);Difficulty in walking, not elsewhere classified (R26.2)    Time: 1739-1810 PT Time Calculation (min) (ACUTE ONLY): 31 min   Charges:   PT Evaluation $PT Eval Low Complexity: 1 Low PT Treatments $Therapeutic Activity: 8-22 mins        Denyla Cortese Conception Chancy, PT, DPT  Pager # [phone]    Anatasia Tino D Damari Suastegui 09/17/2017, 7:53 PM

## 2017-09-17 NOTE — Anesthesia Preprocedure Evaluation (Addendum)
Anesthesia Evaluation  Patient identified by MRN, date of birth, ID band Patient awake    Reviewed: Allergy & Precautions, NPO status , Patient's Chart, lab work & pertinent test results  Airway Mallampati: II  TM Distance: >3 FB Neck ROM: Full    Dental no notable dental hx. (+) Partial Upper, Dental Advisory Given, Chipped   Pulmonary neg pulmonary ROS,    Pulmonary exam normal breath sounds clear to auscultation       Cardiovascular negative cardio ROS Normal cardiovascular exam Rhythm:Regular Rate:Normal     Neuro/Psych negative neurological ROS  negative psych ROS   GI/Hepatic negative GI ROS, Neg liver ROS,   Endo/Other  Hypothyroidism   Renal/GU negative Renal ROS  negative genitourinary   Musculoskeletal negative musculoskeletal ROS (+)   Abdominal   Peds negative pediatric ROS (+)  Hematology negative hematology ROS (+)   Anesthesia Other Findings   Reproductive/Obstetrics negative OB ROS                            Anesthesia Physical Anesthesia Plan  ASA: II  Anesthesia Plan: Spinal   Post-op Pain Management:    Induction:   PONV Risk Score and Plan: 2 and Ondansetron and Treatment may vary due to age or medical condition  Airway Management Planned: Simple Face Mask  Additional Equipment:   Intra-op Plan:   Post-operative Plan:   Informed Consent: I have reviewed the patients History and Physical, chart, labs and discussed the procedure including the risks, benefits and alternatives for the proposed anesthesia with the patient or authorized representative who has indicated his/her understanding and acceptance.   Dental advisory given  Plan Discussed with: CRNA  Anesthesia Plan Comments:         Anesthesia Quick Evaluation

## 2017-09-17 NOTE — Op Note (Signed)
OPERATIVE REPORT  SURGEON: Rod Can, MD   ASSISTANT: Nehemiah Massed, PA-C.  PREOPERATIVE DIAGNOSIS: Right hip arthritis.   POSTOPERATIVE DIAGNOSIS: Right hip arthritis.   PROCEDURE: Right total hip arthroplasty, anterior approach.   IMPLANTS: DePuy Tri Lock stem, size 7, hi offset. DePuy Pinnacle Cup, size 52 mm. DePuy Altrx liner, size 36 by 52 mm, neutral. DePuy metal head ball, size 36 + 1.5 mm.  ANESTHESIA:  Spinal  ESTIMATED BLOOD LOSS:-250 mL    ANTIBIOTICS: 2 g Ancef.  DRAINS: None.  COMPLICATIONS: None.   CONDITION: PACU - hemodynamically stable.   BRIEF CLINICAL NOTE: Traci Richardson is a 84 y.o. female with a long-standing history of Right hip arthritis. After failing conservative management, the patient was indicated for total hip arthroplasty. The risks, benefits, and alternatives to the procedure were explained, and the patient elected to proceed.  PROCEDURE IN DETAIL: Surgical site was marked by myself in the pre-op holding area. Once inside the operating room, spinal anesthesia was obtained, and a foley catheter was inserted. The patient was then positioned on the Hana table. All bony prominences were well padded. The hip was prepped and draped in the normal sterile surgical fashion. A time-out was called verifying side and site of surgery. The patient received IV antibiotics within 60 minutes of beginning the procedure.  The direct anterior approach to the hip was performed through the Hueter interval. Lateral femoral circumflex vessels were treated with the Auqumantys. The anterior capsule was exposed and an inverted T capsulotomy was made.The femoral neck cut was made to the level of the templated cut. A corkscrew was placed into the head and the head was removed. The femoral head was found to have eburnated bone. The head was passed to the back table and was measured.  Acetabular exposure was achieved, and the pulvinar and labrum were  excised. Sequential reaming of the acetabulum was then performed up to a size 51 mm reamer. A 52 mm cup was then opened and impacted into place at approximately 40 degrees of abduction and 20 degrees of anteversion. The final polyethylene liner was impacted into place and acetabular osteophytes were removed.   I then gained femoral exposure taking care to protect the abductors and greater trochanter. This was performed using standard external rotation, extension, and adduction. The capsule was peeled off the inner aspect of the greater trochanter, taking care to preserve the short external rotators. A cookie cutter was used to enter the femoral canal, and then the femoral canal finder was placed. Sequential broaching was performed up to a size 7. Calcar planer was used on the femoral neck remnant. I placed a hi offset neck and a trial head ball. The hip was reduced. Leg lengths and offset were checked fluoroscopically. The hip was dislocated and trial components were removed. The final implants were placed, and the hip was reduced.  Fluoroscopy was used to confirm component position and leg lengths. Lengthening of a few millimeters was required to achieve adequate soft tissue tension and stability. At 90 degrees of external rotation and full extension, the hip was stable to an anterior directed force.  The wound was copiously irrigated with normal saline using pulse lavage. Marcaine solution was injected into the periarticular soft tissue. The wound was closed in layers using #1 Vicryl and V-Loc for the fascia, 2-0 Vicryl for the subcutaneous fat, 2-0 Monocryl for the deep dermal layer, 3-0 running Monocryl subcuticular stitch, and Dermabond for the skin. Once the glue was fully dried,  an Aquacell Ag dressing was applied. The patient was transported to the recovery room in stable condition. Sponge, needle, and instrument counts were correct at the end of the case x2. The patient tolerated the  procedure well and there were no known complications.  Please note that a surgical assistant was a medical necessity for this procedure to perform it in a safe and expeditious manner. Assistant was necessary to provide appropriate retraction of vital neurovascular structures, to prevent femoral fracture, and to allow for anatomic placement of the prosthesis.

## 2017-09-17 NOTE — Transfer of Care (Signed)
Immediate Anesthesia Transfer of Care Note  Patient: Traci Richardson  Procedure(s) Performed: RIGHT TOTAL HIP ARTHROPLASTY ANTERIOR APPROACH (Right Hip)  Patient Location: PACU  Anesthesia Type:Spinal  Level of Consciousness: awake and alert   Airway & Oxygen Therapy: Patient Spontanous Breathing and Patient connected to face mask oxygen  Post-op Assessment: Report given to RN and Post -op Vital signs reviewed and stable  Post vital signs: Reviewed and stable  Last Vitals:  Vitals Value Taken Time  BP 97/72 09/17/2017 10:31 AM  Temp    Pulse 52 09/17/2017 10:34 AM  Resp 20 09/17/2017 10:34 AM  SpO2 100 % 09/17/2017 10:34 AM  Vitals shown include unvalidated device data.  Last Pain:  Vitals:   09/17/17 0638  TempSrc:   PainSc: 1       Patients Stated Pain Goal: 3 (90/90/30 1499)  Complications: No apparent anesthesia complications

## 2017-09-17 NOTE — Anesthesia Procedure Notes (Addendum)
Spinal  Patient location during procedure: OR Staffing Anesthesiologist: Montez Hageman, MD Performed: anesthesiologist  Preanesthetic Checklist Completed: patient identified, site marked, surgical consent, pre-op evaluation, timeout performed, IV checked, risks and benefits discussed and monitors and equipment checked Spinal Block Patient position: sitting Prep: DuraPrep Patient monitoring: heart rate, continuous pulse ox, blood pressure and cardiac monitor Approach: right paramedian Location: L3-4 Injection technique: single-shot Needle Needle type: Spinocan  Needle gauge: 22 G Needle length: 9 cm Additional Notes Expiration date of kit checked and confirmed. Patient tolerated procedure well, without complications.

## 2017-09-17 NOTE — H&P (Signed)
TOTAL HIP ADMISSION H&P  Patient is admitted for right total hip arthroplasty.  Subjective:  Chief Complaint: right hip pain  HPI: Traci Richardson, 84 y.o. female, has a history of pain and functional disability in the right hip(s) due to arthritis and patient has failed non-surgical conservative treatments for greater than 12 weeks to include NSAID's and/or analgesics, flexibility and strengthening excercises, use of assistive devices, weight reduction as appropriate and activity modification.  Onset of symptoms was gradual starting 4 years ago with rapidlly worsening course since that time.The patient noted no past surgery on the right hip(s).  Patient currently rates pain in the right hip at 10 out of 10 with activity. Patient has night pain, worsening of pain with activity and weight bearing, trendelenberg gait, pain that interfers with activities of daily living and pain with passive range of motion. Patient has evidence of subchondral cysts, subchondral sclerosis, periarticular osteophytes and joint space narrowing by imaging studies. This condition presents safety issues increasing the risk of falls.   There is no current active infection.  Patient Active Problem List   Diagnosis Date Noted  . Breast cancer screening 03/27/2017  . Endometrial cancer ([place]) 07/17/2016  . Endometrial adenocarcinoma ([place]) 07/11/2016  . Altered mental status 01/18/2013  . Fever 01/18/2013  . Lumbar compression fracture ([place]) 01/18/2013  . Fall 01/18/2013  . Influenza due to identified novel influenza A virus with other respiratory manifestations 01/18/2013  . Thrombocytopenia, unspecified ([place]) 03/21/2012    Class: Acute  . Colitis presumed infectious 03/18/2012  . Hypotension 03/18/2012  . Hypokalemia 03/18/2012  . Leukocytosis 03/18/2012  . Hypothyroid 03/18/2012  . Hyperlipidemia 03/18/2012  . Nonspecific (abnormal) findings on radiological and other examination of body structure 05/27/2007  . CT,  CHEST, ABNORMAL 05/27/2007   Past Medical History:  Diagnosis Date  . Anxiety   . Arthritis    right hip,  shoulders, right knee  . DDD (degenerative disc disease), lumbosacral    multilevel  . Depression   . Endometrial carcinoma [facility]) oncologist-  dr Denman George-- per lov note in epic , no recurrence   dx 06/ 2018--  Stage IB, Grade 1--  s/p TAH w/ BSO, node dissection 07-17-2016 and completed radiation 09-18-2016  . History of colitis 03/2012  . History of radiation therapy 08/21/16, 08/08/16, 09/04/16, 09/11/16, 09/18/16   Bracytherapy HDR to the vaginal cuff 30 Gy in 5 fractions (endometrial cancer)  . Hyperlipidemia   . Hypothyroidism    endocrinologist--- dr Forde Dandy  . Nocturia   . Wears glasses   . Wears partial dentures    upper    Past Surgical History:  Procedure Laterality Date  . CATARACT EXTRACTION W/ INTRAOCULAR LENS  IMPLANT, BILATERAL  last 1990s  . DILATATION & CURETTAGE/HYSTEROSCOPY WITH MYOSURE N/A 07/01/2016   Procedure: DILATATION & CURETTAGE/HYSTEROSCOPY WITH MYOSURE;  Surgeon: Servando Salina, MD;  Location: [facility];  Service: Gynecology;  Laterality: N/A;  . INCISIONAL HERNIA REPAIR  03-02-2001   dr d. Ninfa Linden @[facility]   . KNEE ARTHROSCOPY W/ MENISCAL REPAIR Left 2014 approx.  Marland Kitchen LAPAROSCOPIC CHOLECYSTECTOMY  early 98s  . ROBOTIC ASSISTED TOTAL HYSTERECTOMY WITH BILATERAL SALPINGO OOPHERECTOMY N/A 07/17/2016   Procedure: ROBOTIC ASSISTED TOTAL HYSTERECTOMY WITH BILATERAL SALPINGO OOPHORECTOMY AND LYSIS OF ADHESIONS;  Surgeon: Everitt Amber, MD;  Location: [facility];  Service: Gynecology;  Laterality: N/A;  . SENTINEL NODE BIOPSY N/A 07/17/2016   Procedure: SENTINEL NODE BIOPSY;  Surgeon: Everitt Amber, MD;  Location: [facility];  Service: Gynecology;  Laterality: N/A;  Current Facility-Administered Medications  Medication Dose Route Frequency Provider Last Rate Last Dose  . 0.9 %  sodium chloride infusion   Intravenous Continuous Alyra Patty, Aaron Edelman, MD      . acetaminophen  (OFIRMEV) IV 1,000 mg  1,000 mg Intravenous To OR Lucina , Aaron Edelman, MD      . ceFAZolin (ANCEF) 2-4 GM/100ML-% IVPB           . ceFAZolin (ANCEF) IVPB 2g/100 mL premix  2 g Intravenous On Call to OR Larry Knipp, Aaron Edelman, MD      . chlorhexidine (HIBICLENS) 4 % liquid 4 application  60 mL Topical Once Alydia Gosser, Aaron Edelman, MD      . chlorhexidine (HIBICLENS) 4 % liquid 4 application  60 mL Topical Once Sharaine Delange, Aaron Edelman, MD      . lip balm (CARMEX) ointment           . phenylephrine (NEOSYNEPHRINE) 10-0.9 MG/250ML-% infusion           . sodium chloride 0.9 % with tranexamic acid (CYKLOKAPRON) ADS Med           . tranexamic acid (CYKLOKAPRON) 1,000 mg in sodium chloride 0.9 % 100 mL IVPB  1,000 mg Intravenous To OR Attikus Bartoszek, Aaron Edelman, MD       Allergies  Allergen Reactions  . Sulfa Antibiotics Hives  . Codeine Other (See Comments)    Passes out  . Tramadol Other (See Comments)    Mood changes including confusion.  . Latex Itching and Rash    On hands w/latex gloves    Social History   Tobacco Use  . Smoking status: Never Smoker  . Smokeless tobacco: Never Used  Substance Use Topics  . Alcohol use: No    Family History  Problem Relation Age of Onset  . Thyroid cancer Sister      Review of Systems  Constitutional: Negative.   HENT: Negative.   Eyes: Negative.   Respiratory: Negative.   Cardiovascular: Negative.   Gastrointestinal: Negative.   Genitourinary: Negative.   Musculoskeletal: Positive for joint pain.  Skin: Negative.   Neurological: Negative.   Endo/Heme/Allergies: Negative.   Psychiatric/Behavioral: Negative.     Objective:  Physical Exam  Vitals reviewed. Constitutional: She is oriented to person, place, and time. She appears well-developed and well-nourished.  HENT:  Head: Normocephalic and atraumatic.  Eyes: Pupils are equal, round, and reactive to light. Conjunctivae and EOM are normal.  Neck: Normal range of motion. Neck supple.  Cardiovascular: Normal rate,  regular rhythm and intact distal pulses.  Respiratory: Effort normal. No respiratory distress.  GI: Soft. She exhibits no distension.  Genitourinary:  Genitourinary Comments: deferred  Musculoskeletal:       Right hip: She exhibits decreased range of motion and bony tenderness.  Neurological: She is alert and oriented to person, place, and time. She has normal reflexes.  Skin: Skin is warm and dry.  Psychiatric: She has a normal mood and affect. Her behavior is normal. Judgment and thought content normal.    Vital signs in last 24 hours: Temp:  [98.7 F (37.1 C)] 98.7 F (37.1 C) (09/12 0628) Pulse Rate:  [82] 82 (09/12 0628) Resp:  [16] 16 (09/12 0628) BP: (156)/(102) 156/102 (09/12 0628) SpO2:  [96 %] 96 % (09/12 0628) Weight:  [84.4 kg] 84.4 kg (09/12 1607)  Labs:   Estimated body mass index is 33.5 kg/m as calculated from the following:   Height as of this encounter: 5' 2.5" (1.588 m).   Weight as of this encounter:  84.4 kg.   Imaging Review Plain radiographs demonstrate severe degenerative joint disease of the right hip(s). The bone quality appears to be adequate for age and reported activity level.    Preoperative templating of the joint replacement has been completed, documented, and submitted to the Operating Room personnel in order to optimize intra-operative equipment management.     Assessment/Plan:  End stage arthritis, right hip(s)  The patient history, physical examination, clinical judgement of the provider and imaging studies are consistent with end stage degenerative joint disease of the right hip(s) and total hip arthroplasty is deemed medically necessary. The treatment options including medical management, injection therapy, arthroscopy and arthroplasty were discussed at length. The risks and benefits of total hip arthroplasty were presented and reviewed. The risks due to aseptic loosening, infection, stiffness, dislocation/subluxation,  thromboembolic  complications and other imponderables were discussed.  The patient acknowledged the explanation, agreed to proceed with the plan and consent was signed. Patient is being admitted for inpatient treatment for surgery, pain control, PT, OT, prophylactic antibiotics, VTE prophylaxis, progressive ambulation and ADL's and discharge planning.The patient is planning to be discharged home with HEP

## 2017-09-17 NOTE — Anesthesia Procedure Notes (Signed)
Procedure Name: MAC Date/Time: 09/17/2017 7:40 AM Performed by: Cynda Familia, CRNA Pre-anesthesia Checklist: Patient identified, Emergency Drugs available, Suction available, Patient being monitored and Timeout performed Patient Re-evaluated:Patient Re-evaluated prior to induction Oxygen Delivery Method: Simple face mask Placement Confirmation: positive ETCO2 and breath sounds checked- equal and bilateral Dental Injury: Teeth and Oropharynx as per pre-operative assessment  Comments: Sedation for spinal anes

## 2017-09-18 ENCOUNTER — Encounter (HOSPITAL_COMMUNITY): Payer: Self-pay | Admitting: Orthopedic Surgery

## 2017-09-18 LAB — BASIC METABOLIC PANEL
Anion gap: 5 (ref 5–15)
BUN: 13 mg/dL (ref 8–23)
CO2: 26 mmol/L (ref 22–32)
Calcium: 8.2 mg/dL — ABNORMAL LOW (ref 8.9–10.3)
Chloride: 111 mmol/L (ref 98–111)
Creatinine, Ser: 0.95 mg/dL (ref 0.44–1.00)
GFR calc Af Amer: 59 mL/min — ABNORMAL LOW (ref >60)
GFR calc non Af Amer: 51 mL/min — ABNORMAL LOW (ref >60)
Glucose, Bld: 142 mg/dL — ABNORMAL HIGH (ref 70–99)
Potassium: 3.7 mmol/L (ref 3.5–5.1)
Sodium: 142 mmol/L (ref 135–145)

## 2017-09-18 LAB — CBC
HCT: 29.9 % — ABNORMAL LOW (ref 36.0–46.0)
Hemoglobin: 9.9 g/dL — ABNORMAL LOW (ref 12.0–15.0)
MCH: 32.7 pg (ref 26.0–34.0)
MCHC: 33.1 g/dL (ref 30.0–36.0)
MCV: 98.7 fL (ref 78.0–100.0)
Platelets: 125 10*3/uL — ABNORMAL LOW (ref 150–400)
RBC: 3.03 MIL/uL — ABNORMAL LOW (ref 3.87–5.11)
RDW: 13.2 % (ref 11.5–15.5)
WBC: 6.4 10*3/uL (ref 4.0–10.5)

## 2017-09-18 MED ORDER — ONDANSETRON HCL 4 MG PO TABS: 4.0000 mg | ORAL_TABLET | Freq: Four times a day (QID) | ORAL | 0 refills | Status: DC | PRN

## 2017-09-18 MED ORDER — DOCUSATE SODIUM 100 MG PO CAPS: 100.0000 mg | ORAL_CAPSULE | Freq: Two times a day (BID) | ORAL | 1 refills | Status: AC

## 2017-09-18 MED ORDER — ASPIRIN 81 MG PO CHEW: 81.0000 mg | CHEWABLE_TABLET | Freq: Two times a day (BID) | ORAL | 1 refills | Status: DC

## 2017-09-18 MED ORDER — HYDROCODONE-ACETAMINOPHEN 5-325 MG PO TABS: 1.0000 | ORAL_TABLET | Freq: Four times a day (QID) | ORAL | 0 refills | Status: DC | PRN

## 2017-09-18 MED ORDER — SENNA 8.6 MG PO TABS: 1.0000 | ORAL_TABLET | Freq: Two times a day (BID) | ORAL | 0 refills | Status: AC

## 2017-09-18 NOTE — Care Management (Signed)
DC plan: per pt, her plan is HEP. Youth RW and 3in1 provided by [employer]. Marney Doctor RN,BSN [phone]

## 2017-09-18 NOTE — Discharge Summary (Signed)
Physician Discharge Summary  Patient ID: Traci Richardson MRN: [id number] DOB/AGE: 1925/08/02 84 y.o.  Admit date: 09/17/2017 Discharge date: 09/21/2017  Admission Diagnoses:  Osteoarthritis of right hip  Discharge Diagnoses:  Principal Problem:   Osteoarthritis of right hip   Past Medical History:  Diagnosis Date  . Anxiety   . Arthritis    right hip,  shoulders, right knee  . DDD (degenerative disc disease), lumbosacral    multilevel  . Depression   . Endometrial carcinoma [facility]) oncologist-  dr Denman George-- per lov note in epic , no recurrence   dx 06/ 2018--  Stage IB, Grade 1--  s/p TAH w/ BSO, node dissection 07-17-2016 and completed radiation 09-18-2016  . History of colitis 03/2012  . History of radiation therapy 08/21/16, 08/08/16, 09/04/16, 09/11/16, 09/18/16   Bracytherapy HDR to the vaginal cuff 30 Gy in 5 fractions (endometrial cancer)  . Hyperlipidemia   . Hypothyroidism    endocrinologist--- dr Forde Dandy  . Nocturia   . Wears glasses   . Wears partial dentures    upper    Surgeries: Procedure(s): RIGHT TOTAL HIP ARTHROPLASTY ANTERIOR APPROACH on 09/17/2017   Consultants (if any):   Discharged Condition: Improved  Hospital Course: Traci Richardson is an 84 y.o. female who was admitted 09/17/2017 with a diagnosis of Osteoarthritis of right hip and went to the operating room on 09/17/2017 and underwent the above named procedures.    She was given perioperative antibiotics:  Anti-infectives (From admission, onward)   Start     Dose/Rate Route Frequency Ordered Stop   09/17/17 1400  ceFAZolin (ANCEF) IVPB 2g/100 mL premix     2 g 200 mL/hr over 30 Minutes Intravenous Every 6 hours 09/17/17 1153 09/17/17 2100   09/17/17 0620  ceFAZolin (ANCEF) 2-4 GM/100ML-% IVPB    Note to Pharmacy:  Lavon Paganini   : cabinet override      09/17/17 0620 09/17/17 1829   09/17/17 0615  ceFAZolin (ANCEF) IVPB 2g/100 mL premix     2 g 200 mL/hr over 30 Minutes Intravenous On call to O.R. 09/17/17  8921 09/17/17 0800    .  She was given sequential compression devices, early ambulation, and ASA for DVT prophylaxis.  She did progress somewhat slowly with PT initially.  She benefited maximally from the hospital stay and there were no complications.    Recent vital signs:  Vitals:   09/20/17 2112 09/21/17 0506  BP: (!) 149/97 (!) 160/97  Pulse: 92 82  Resp: 16 17  Temp: 99.1 F (37.3 C) 98.1 F (36.7 C)  SpO2: 98% 100%    Recent laboratory studies:  Lab Results  Component Value Date   HGB 10.2 (L) 09/20/2017   HGB 10.2 (L) 09/19/2017   HGB 9.9 (L) 09/18/2017   Lab Results  Component Value Date   WBC 6.9 09/20/2017   PLT 128 (L) 09/20/2017   Lab Results  Component Value Date   INR 1.00 01/31/2011   Lab Results  Component Value Date   NA 142 09/18/2017   K 3.7 09/18/2017   CL 111 09/18/2017   CO2 26 09/18/2017   BUN 13 09/18/2017   CREATININE 0.95 09/18/2017   GLUCOSE 142 (H) 09/18/2017    Discharge Medications:   Allergies as of 09/21/2017      Reactions   Sulfa Antibiotics Hives   Codeine Other (See Comments)   Passes out   Tramadol Other (See Comments)   Mood changes including confusion.   Latex Itching,  Rash   On hands w/latex gloves      Medication List    STOP taking these medications   acetaminophen 500 MG tablet Commonly known as:  TYLENOL   aspirin EC 81 MG tablet Replaced by:  aspirin 81 MG chewable tablet     TAKE these medications   aspirin 81 MG chewable tablet Chew 1 tablet (81 mg total) by mouth 2 (two) times daily. Replaces:  aspirin EC 81 MG tablet   busPIRone 5 MG tablet Commonly known as:  BUSPAR Take 5 mg by mouth daily after breakfast.   docusate sodium 100 MG capsule Commonly known as:  COLACE Take 1 capsule (100 mg total) by mouth 2 (two) times daily.   ezetimibe 10 MG tablet Commonly known as:  ZETIA Take 10 mg by mouth daily after breakfast.   HYDROcodone-acetaminophen 5-325 MG tablet Commonly known as:   NORCO/VICODIN Take 1-2 tablets by mouth every 6 (six) hours as needed for moderate pain (pain score 4-6).   Traci Richardson 100 MCG tablet Commonly known as:  SYNTHROID, LEVOTHROID Take 100 mcg by mouth daily before breakfast. On an empty stomach   naproxen sodium 220 MG tablet Commonly known as:  ALEVE Take 440 mg by mouth daily after breakfast.   ondansetron 4 MG tablet Commonly known as:  ZOFRAN Take 1 tablet (4 mg total) by mouth every 6 (six) hours as needed for nausea.   senna 8.6 MG Tabs tablet Commonly known as:  SENOKOT Take 1 tablet (8.6 mg total) by mouth 2 (two) times daily.   simvastatin 20 MG tablet Commonly known as:  ZOCOR Take 20 mg by mouth daily after breakfast.   SYSTANE ULTRA 0.4-0.3 % Soln Generic drug:  Polyethyl Glycol-Propyl Glycol Place 1-2 drops into both eyes daily as needed (for dry eyes).   vitamin B-12 500 MCG tablet Commonly known as:  CYANOCOBALAMIN Take 500 mcg by mouth daily at 3 pm.   Vitamin D (Ergocalciferol) 50000 units Caps capsule Commonly known as:  DRISDOL Take 50,000 Units by mouth 2 (two) times a week. Tuesday & Thursday after breakfast Notes to patient:  Take as prescribed            Durable Medical Equipment  (From admission, onward)         Start     Ordered   09/18/17 1028  For home use only DME 3 n 1  Once     09/18/17 1027   09/18/17 1009  For home use only DME Walker rolling  Once    Comments:  [employer] [facility]  Question:  Patient needs a walker to treat with the following condition  Answer:  S/P hip replacement, right   09/18/17 1010          Diagnostic Studies: Dg Pelvis Portable  Result Date: 09/17/2017 CLINICAL DATA:  Postop right hip replacement EXAM: PORTABLE PELVIS 1-2 VIEWS COMPARISON:  09/17/2017 FINDINGS: Changes of right hip replacement. No hardware or bony complicating feature. Normal AP alignment. IMPRESSION: Right hip replacement.  No visible complicating feature. Electronically Signed   By: Rolm Baptise M.D.   On: 09/17/2017 10:53   Dg C-arm 1-60 Min-no Report  Result Date: 09/17/2017 Fluoroscopy was utilized by the requesting physician.  No radiographic interpretation.   Dg Hip Operative Unilat W Or W/o Pelvis Right  Result Date: 09/17/2017 CLINICAL DATA:  Right-sided hip replacement EXAM: OPERATIVE right HIP (WITH PELVIS IF PERFORMED) 2 VIEWS TECHNIQUE: Fluoroscopic spot image(s) were submitted for interpretation post-operatively. COMPARISON:  None. FINDINGS: Two-view show total hip replacement on the right. Components appear well positioned. No radiographically detectable complication. IMPRESSION: Good appearance following right hip replacement. Electronically Signed   By: Nelson Chimes M.D.   On: 09/17/2017 10:45    Disposition: Discharge disposition: 01-Home or Self Care       Discharge Instructions    Call MD / Call 911   Complete by:  As directed    If you experience chest pain or shortness of breath, CALL 911 and be transported to the hospital emergency room.  If you develope a fever above 101 F, pus (white drainage) or increased drainage or redness at the wound, or calf pain, call your surgeon's office.   Call MD / Call 911   Complete by:  As directed    If you experience chest pain or shortness of breath, CALL 911 and be transported to the hospital emergency room.  If you develope a fever above 101 F, pus (white drainage) or increased drainage or redness at the wound, or calf pain, call your surgeon's office.   Constipation Prevention   Complete by:  As directed    Drink plenty of fluids.  Prune juice may be helpful.  You may use a stool softener, such as Colace (over the counter) 100 mg twice a day.  Use MiraLax (over the counter) for constipation as needed.   Constipation Prevention   Complete by:  As directed    Drink plenty of fluids.  Prune juice may be helpful.  You may use a stool softener, such as Colace (over the counter) 100 mg twice a day.  Use MiraLax (over  the counter) for constipation as needed.   Diet - low sodium heart healthy   Complete by:  As directed    Diet - low sodium heart healthy   Complete by:  As directed    Driving restrictions   Complete by:  As directed    No driving for 6 weeks   Driving restrictions   Complete by:  As directed    No driving for 6 weeks   Increase activity slowly as tolerated   Complete by:  As directed    Increase activity slowly as tolerated   Complete by:  As directed    Lifting restrictions   Complete by:  As directed    No lifting for 6 weeks   Lifting restrictions   Complete by:  As directed    No lifting for 6 weeks   TED hose   Complete by:  As directed    Use stockings (TED hose) for 2 weeks on both leg(s).  You may remove them at night for sleeping.   TED hose   Complete by:  As directed    Use stockings (TED hose) for 2 weeks on both leg(s).  You may remove them at night for sleeping.      Follow-up Information    Alora Gorey, Aaron Edelman, MD. Schedule an appointment as soon as possible for a visit in 2 weeks.   Specialty:  Orthopedic Surgery Why:  For wound re-check Contact information: [address] [place] [address] [phone]            Signed: Hilton Cork Daffney Greenly 09/21/2017, 12:51 PM

## 2017-09-18 NOTE — Progress Notes (Signed)
Physical Therapy Treatment Patient Details Name: LUKE RIGSBEE MRN: [id number] DOB: [birth year] Today's Date: 09/18/2017    History of Present Illness Pt is a 84 YO female s/p R DA-THA on 09/17/17. PMH includes endometrial cancer 2015, AMS, fever, lumbar compression fracture, anxiety, DDD. Surgical history includes cataracts, L knee scope, lap chole 1990s    PT Comments    Pt very cooperative but progressing slowly with multiple rests required for task completion and significant difficulty advancing R LE with ambulation.   Follow Up Recommendations  Follow surgeon's recommendation for DC plan and follow-up therapies;Supervision for mobility/OOB     Equipment Recommendations  Rolling walker with 5" wheels    Recommendations for Other Services       Precautions / Restrictions Precautions Precautions: Fall Restrictions Weight Bearing Restrictions: No Other Position/Activity Restrictions: WBAT     Mobility  Bed Mobility               General bed mobility comments: Pt up in chair and requests back to same  Transfers Overall transfer level: Needs assistance Equipment used: Rolling walker (2 wheeled) Transfers: Sit to/from Stand Sit to Stand: Min assist         General transfer comment: cues for transition position and use of UEs to self assist  Ambulation/Gait Ambulation/Gait assistance: Min assist;Mod assist;+2 physical assistance;+2 safety/equipment Gait Distance (Feet): 18 Feet Assistive device: Rolling walker (2 wheeled) Gait Pattern/deviations: Step-to pattern;Wide base of support;Decreased stride length;Decreased weight shift to right;Decreased stance time - right;Trunk flexed Gait velocity: very decr, multiple pauses    General Gait Details: Pt with wide stance upon standing, verbal and tactile cuing for pt to bring RLE towards midline. Pt with limited weight shifting to RLE when practicing standing weight shifts L and R.  Cues for LE management and use of UEs  to self assist   Stairs             Wheelchair Mobility    Modified Rankin (Stroke Patients Only)       Balance Overall balance assessment: Needs assistance Sitting-balance support: Feet supported Sitting balance-Leahy Scale: Good     Standing balance support: Bilateral upper extremity supported Standing balance-Leahy Scale: Poor Standing balance comment: relies on RW and PT support in standing                            Cognition Arousal/Alertness: Awake/alert Behavior During Therapy: WFL for tasks assessed/performed Overall Cognitive Status: Within Functional Limits for tasks assessed                                        Exercises Total Joint Exercises Ankle Circles/Pumps: AROM;Both;10 reps;Supine Quad Sets: AROM;Right;Supine;10 reps Heel Slides: AAROM;Right;20 reps;Supine Hip ABduction/ADduction: AAROM;Right;15 reps;Supine    General Comments        Pertinent Vitals/Pain Pain Assessment: 0-10 Pain Score: 3  Pain Location: R hip Pain Descriptors / Indicators: Aching;Sore Pain Intervention(s): Limited activity within patient's tolerance;Monitored during session;Premedicated before session;Ice applied    Home Living                      Prior Function            PT Goals (current goals can now be found in the care plan section) Acute Rehab PT Goals Patient Stated Goal: Regain IND PT Goal Formulation: With  patient Time For Goal Achievement: 10/01/17 Potential to Achieve Goals: Good Progress towards PT goals: Progressing toward goals    Frequency    7X/week      PT Plan Current plan remains appropriate    Co-evaluation              AM-PAC PT "6 Clicks" Daily Activity  Outcome Measure  Difficulty turning over in bed (including adjusting bedclothes, sheets and blankets)?: Unable Difficulty moving from lying on back to sitting on the side of the bed? : Unable Difficulty sitting down on and  standing up from a chair with arms (e.g., wheelchair, bedside commode, etc,.)?: Unable Help needed moving to and from a bed to chair (including a wheelchair)?: A Little Help needed walking in hospital room?: A Lot Help needed climbing 3-5 steps with a railing? : A Lot 6 Click Score: 10    End of Session Equipment Utilized During Treatment: Gait belt Activity Tolerance: Patient tolerated treatment well;Patient limited by fatigue Patient left: in chair;with chair alarm set;with call bell/phone within reach;with family/visitor present;with SCD's reapplied Nurse Communication: Mobility status PT Visit Diagnosis: Unsteadiness on feet (R26.81);Difficulty in walking, not elsewhere classified (R26.2)     Time: 4481-8563 PT Time Calculation (min) (ACUTE ONLY): 30 min  Charges:  $Gait Training: 8-22 mins $Therapeutic Exercise: 8-22 mins                     [place] Pager [phone] Office [phone]    Ilda Laskin 09/18/2017, 9:57 AM

## 2017-09-18 NOTE — Progress Notes (Signed)
Physical Therapy Treatment Patient Details Name: Traci Richardson MRN: [id number] DOB: 1925/03/26 Today's Date: 09/18/2017    History of Present Illness Pt is a 84 YO female s/p R DA-THA on 09/17/17. PMH includes endometrial cancer 2015, AMS, fever, lumbar compression fracture, anxiety, DDD. Surgical history includes cataracts, L knee scope, lap chole 1990s    PT Comments    Pt's dtr present for family ed.  Reviewed therex program with progression and written instruction.  Many questions asked and answered.   Follow Up Recommendations  Follow surgeon's recommendation for DC plan and follow-up therapies;Supervision for mobility/OOB     Equipment Recommendations  Rolling walker with 5" wheels    Recommendations for Other Services       Precautions / Restrictions Precautions Precautions: Fall Restrictions Weight Bearing Restrictions: No Other Position/Activity Restrictions: WBAT     Mobility  Bed Mobility               General bed mobility comments: OOB deferred 2* pt fatigue  Transfers Overall transfer level: Needs assistance Equipment used: Rolling walker (2 wheeled) Transfers: Sit to/from Stand Sit to Stand: Min assist         General transfer comment: cues for transition position and use of UEs to self assist  Ambulation/Gait Ambulation/Gait assistance: Min assist;Mod assist;+2 physical assistance;+2 safety/equipment Gait Distance (Feet): 2 Feet Assistive device: Rolling walker (2 wheeled) Gait Pattern/deviations: Step-to pattern;Wide base of support;Decreased stride length;Decreased weight shift to right;Decreased stance time - right;Trunk flexed     General Gait Details: Cues for posture, sequence and position from RW.  Pt with R lean and difficulty holding wt on R LE.  Pt able to make two small steps and then states - my legs wont hold me - returned to sitting.   Stairs             Wheelchair Mobility    Modified Rankin (Stroke Patients Only)        Balance Overall balance assessment: Needs assistance Sitting-balance support: Feet supported Sitting balance-Leahy Scale: Good     Standing balance support: Bilateral upper extremity supported Standing balance-Leahy Scale: Poor                              Cognition Arousal/Alertness: Awake/alert Behavior During Therapy: WFL for tasks assessed/performed Overall Cognitive Status: Within Functional Limits for tasks assessed                                        Exercises Total Joint Exercises Ankle Circles/Pumps: AROM;Both;10 reps;Supine Quad Sets: AROM;Right;Supine;10 reps Heel Slides: AAROM;Right;20 reps;Supine Hip ABduction/ADduction: AAROM;Right;15 reps;Supine Long Arc Quad: AAROM;Right;10 reps;Seated    General Comments        Pertinent Vitals/Pain Pain Assessment: 0-10 Pain Score: 2  Pain Location: R hip Pain Descriptors / Indicators: Aching;Sore Pain Intervention(s): Limited activity within patient's tolerance;Monitored during session;Ice applied    Home Living                      Prior Function            PT Goals (current goals can now be found in the care plan section) Acute Rehab PT Goals Patient Stated Goal: Regain IND PT Goal Formulation: With patient Time For Goal Achievement: 10/01/17 Potential to Achieve Goals: Good Progress towards PT goals: Progressing toward goals  Frequency    7X/week      PT Plan Current plan remains appropriate    Co-evaluation              AM-PAC PT "6 Clicks" Daily Activity  Outcome Measure  Difficulty turning over in bed (including adjusting bedclothes, sheets and blankets)?: Unable Difficulty moving from lying on back to sitting on the side of the bed? : Unable Difficulty sitting down on and standing up from a chair with arms (e.g., wheelchair, bedside commode, etc,.)?: Unable Help needed moving to and from a bed to chair (including a wheelchair)?: A  Lot Help needed walking in hospital room?: A Little Help needed climbing 3-5 steps with a railing? : A Lot 6 Click Score: 10    End of Session Equipment Utilized During Treatment: Gait belt Activity Tolerance: Patient tolerated treatment well;Patient limited by fatigue Patient left: in bed;with call bell/phone within reach;with family/visitor present Nurse Communication: Mobility status PT Visit Diagnosis: Unsteadiness on feet (R26.81);Difficulty in walking, not elsewhere classified (R26.2)     Time: 6606-3016 PT Time Calculation (min) (ACUTE ONLY): 30 min  Charges:  $Gait Training: 8-22 mins $Therapeutic Exercise: 8-22 mins $Therapeutic Activity: 8-22 mins                     Debe Coder PT Acute Rehabilitation Services Pager [phone] Office [phone]    Quran Vasco 09/18/2017, 4:50 PM

## 2017-09-18 NOTE — Plan of Care (Signed)
Pt alert and oriented, resting with daughter at the bedside. Able to work with therapy this am, pain well controlled with PO meds. RN will monitor.

## 2017-09-18 NOTE — Progress Notes (Signed)
Physical Therapy Treatment Patient Details Name: Traci Richardson MRN: [id number] DOB: 26-Jul-1925 Today's Date: 09/18/2017    History of Present Illness Pt is a 84 YO female s/p R DA-THA on 09/17/17. PMH includes endometrial cancer 2015, AMS, fever, lumbar compression fracture, anxiety, DDD. Surgical history includes cataracts, L knee scope, lap chole 1990s    PT Comments    Pt continues cooperative this pm but very limited with ambulation.  Pt able to take two small steps before stating "I need to sit, my legs wont hold me".  RN aware.   Follow Up Recommendations  Follow surgeon's recommendation for DC plan and follow-up therapies;Supervision for mobility/OOB     Equipment Recommendations  Rolling walker with 5" wheels    Recommendations for Other Services       Precautions / Restrictions Precautions Precautions: Fall Restrictions Weight Bearing Restrictions: No Other Position/Activity Restrictions: WBAT     Mobility  Bed Mobility               General bed mobility comments: Pt up in chair and requests back to same  Transfers Overall transfer level: Needs assistance Equipment used: Rolling walker (2 wheeled) Transfers: Sit to/from Stand Sit to Stand: Min assist         General transfer comment: cues for transition position and use of UEs to self assist  Ambulation/Gait Ambulation/Gait assistance: Min assist;Mod assist;+2 physical assistance;+2 safety/equipment Gait Distance (Feet): 2 Feet Assistive device: Rolling walker (2 wheeled) Gait Pattern/deviations: Step-to pattern;Wide base of support;Decreased stride length;Decreased weight shift to right;Decreased stance time - right;Trunk flexed     General Gait Details: Cues for posture, sequence and position from RW.  Pt with R lean and difficulty holding wt on R LE.  Pt able to make two small steps and then states - my legs wont hold me - returned to sitting.   Stairs             Wheelchair Mobility     Modified Rankin (Stroke Patients Only)       Balance Overall balance assessment: Needs assistance Sitting-balance support: Feet supported Sitting balance-Leahy Scale: Good     Standing balance support: Bilateral upper extremity supported Standing balance-Leahy Scale: Poor                              Cognition Arousal/Alertness: Awake/alert Behavior During Therapy: WFL for tasks assessed/performed Overall Cognitive Status: Within Functional Limits for tasks assessed                                        Exercises Total Joint Exercises Ankle Circles/Pumps: AROM;Both;10 reps;Supine Quad Sets: AROM;Right;Supine;10 reps Heel Slides: AAROM;Right;20 reps;Supine Hip ABduction/ADduction: AAROM;Right;15 reps;Supine Long Arc Quad: AAROM;Right;10 reps;Seated    General Comments        Pertinent Vitals/Pain Pain Assessment: 0-10 Pain Score: 2  Pain Location: R hip Pain Descriptors / Indicators: Aching;Sore Pain Intervention(s): Premedicated before session;Monitored during session;Limited activity within patient's tolerance    Home Living                      Prior Function            PT Goals (current goals can now be found in the care plan section) Acute Rehab PT Goals Patient Stated Goal: Regain IND PT Goal Formulation: With patient Time  For Goal Achievement: 10/01/17 Potential to Achieve Goals: Good Progress towards PT goals: Progressing toward goals    Frequency    7X/week      PT Plan Current plan remains appropriate    Co-evaluation              AM-PAC PT "6 Clicks" Daily Activity  Outcome Measure  Difficulty turning over in bed (including adjusting bedclothes, sheets and blankets)?: Unable Difficulty moving from lying on back to sitting on the side of the bed? : Unable Difficulty sitting down on and standing up from a chair with arms (e.g., wheelchair, bedside commode, etc,.)?: Unable Help needed moving  to and from a bed to chair (including a wheelchair)?: A Lot Help needed walking in hospital room?: A Little Help needed climbing 3-5 steps with a railing? : A Lot 6 Click Score: 10    End of Session Equipment Utilized During Treatment: Gait belt Activity Tolerance: Patient tolerated treatment well;Patient limited by fatigue Patient left: in chair;with chair alarm set;with call bell/phone within reach;with family/visitor present;with SCD's reapplied Nurse Communication: Mobility status PT Visit Diagnosis: Unsteadiness on feet (R26.81);Difficulty in walking, not elsewhere classified (R26.2)     Time: 8719-5974 PT Time Calculation (min) (ACUTE ONLY): 30 min  Charges:  $Gait Training: 8-22 mins $Therapeutic Exercise: 8-22 mins                     [place] Pager [phone] Office [phone]    Maghan Jessee 09/18/2017, 2:12 PM

## 2017-09-18 NOTE — Progress Notes (Signed)
    Subjective:  Patient reports pain as mild to moderate.  Denies N/V/CP/SOB. Walked 18 ft with PT.  Objective:   VITALS:   Vitals:   09/17/17 2152 09/18/17 0158 09/18/17 0521 09/18/17 1004  BP: 125/71 (!) 145/77 129/75 105/70  Pulse: 76 75 72 83  Resp: 16 15 16 16   Temp: 98.3 F (36.8 C) (!) 97.3 F (36.3 C) 98 F (36.7 C) 98.7 F (37.1 C)  TempSrc: Oral Oral Oral Oral  SpO2: 100% 99% 96% 98%  Weight:      Height:        NAD ABD soft Sensation intact distally Intact pulses distally Dorsiflexion/Plantar flexion intact Incision: dressing C/D/I Compartment soft   Lab Results  Component Value Date   WBC 6.4 09/18/2017   HGB 9.9 (L) 09/18/2017   HCT 29.9 (L) 09/18/2017   MCV 98.7 09/18/2017   PLT 125 (L) 09/18/2017   BMET    Component Value Date/Time   NA 142 09/18/2017 0354   K 3.7 09/18/2017 0354   CL 111 09/18/2017 0354   CO2 26 09/18/2017 0354   GLUCOSE 142 (H) 09/18/2017 0354   BUN 13 09/18/2017 0354   CREATININE 0.95 09/18/2017 0354   CALCIUM 8.2 (L) 09/18/2017 0354   GFRNONAA 51 (L) 09/18/2017 0354   GFRAA 59 (L) 09/18/2017 0354     Assessment/Plan: 1 Day Post-Op   Principal Problem:   Osteoarthritis of right hip   WBAT with walker DVT ppx: Aspirin, SCDs, TEDS PO pain control PT/OT Dispo: D/C home with HEP after clears PT, today vs tomorrow   Hilton Cork Sidharth Leverette 09/18/2017, 11:59 AM   Rod Can, MD Cell [phone]

## 2017-09-19 LAB — CBC
HCT: 30.5 % — ABNORMAL LOW (ref 36.0–46.0)
Hemoglobin: 10.2 g/dL — ABNORMAL LOW (ref 12.0–15.0)
MCH: 32.4 pg (ref 26.0–34.0)
MCHC: 33.4 g/dL (ref 30.0–36.0)
MCV: 96.8 fL (ref 78.0–100.0)
Platelets: 136 10*3/uL — ABNORMAL LOW (ref 150–400)
RBC: 3.15 MIL/uL — ABNORMAL LOW (ref 3.87–5.11)
RDW: 13.1 % (ref 11.5–15.5)
WBC: 8.2 10*3/uL (ref 4.0–10.5)

## 2017-09-19 MED ORDER — ALBUTEROL SULFATE (2.5 MG/3ML) 0.083% IN NEBU
2.5000 mg | INHALATION_SOLUTION | Freq: Four times a day (QID) | RESPIRATORY_TRACT | Status: DC | PRN
  Administered 2017-09-19: 2.5 mg via RESPIRATORY_TRACT
  Filled 2017-09-19: qty 3

## 2017-09-19 NOTE — Progress Notes (Addendum)
Traci Richardson  MRN: [id number] DOB/Age: January 09, 1925 84 y.o. Physician: Ander Slade, M.D. 2 Days Post-Op Procedure(s) (LRB): RIGHT TOTAL HIP ARTHROPLASTY ANTERIOR APPROACH (Right)  Subjective: Reports continued difficulty with mobility, frequent voiding, congestion. Minimal pain Vital Signs Temp:  [98.7 F (37.1 C)-99.5 F (37.5 C)] 99.5 F (37.5 C) (09/14 0540) Pulse Rate:  [83-96] 93 (09/14 0540) Resp:  [16-17] 17 (09/13 2048) BP: (105-161)/(70-116) 161/83 (09/14 0540) SpO2:  [97 %-98 %] 97 % (09/14 0540)  Lab Results Recent Labs    09/18/17 0354 09/19/17 0403  WBC 6.4 8.2  HGB 9.9* 10.2*  HCT 29.9* 30.5*  PLT 125* 136*   BMET Recent Labs    09/18/17 0354  NA 142  K 3.7  CL 111  CO2 26  GLUCOSE 142*  BUN 13  CREATININE 0.95  CALCIUM 8.2*   INR  Date Value Ref Range Status  01/31/2011 1.00 0.00 - 1.49 Final     Exam  Audible congestion, limited inspiratory volume with IS. Thigh soft, dressing dry, N/V intact RLE  Plan Coached on improved pulmonary toilet, continue mobility training with PT. THA protocol. Will place order for PRN respiratory treatment. Draylon Mercadel M Avarose Mervine 09/19/2017, 8:32 AM    Contact # [phone]

## 2017-09-19 NOTE — Progress Notes (Signed)
Physical Therapy Treatment Patient Details Name: Traci Richardson MRN: [id number] DOB: 1925-03-28 Today's Date: 09/19/2017    History of Present Illness Pt is a 84 YO female s/p R DA-THA on 09/17/17. PMH includes endometrial cancer 2015, AMS, fever, lumbar compression fracture, anxiety, DDD. Surgical history includes cataracts, L knee scope, lap chole 1990s    PT Comments    Pt continues very cooperative but progressing very slowly with mobility - pt fatigues very easily and reports "my legs won't hold me much longer, I need to sit".  Pt scheduled by surgeon for dc home with HEP only but would greatly benefit from follow up at SNF level rehab vs HHPT intervention.  Follow Up Recommendations  Follow surgeon's recommendation for DC plan and follow-up therapies;Supervision for mobility/OOB     Equipment Recommendations  Rolling walker with 5" wheels    Recommendations for Other Services       Precautions / Restrictions Precautions Precautions: Fall Restrictions Weight Bearing Restrictions: No Other Position/Activity Restrictions: WBAT     Mobility  Bed Mobility               General bed mobility comments: up with nursing   Transfers Overall transfer level: Needs assistance Equipment used: Rolling walker (2 wheeled) Transfers: Sit to/from Stand Sit to Stand: Min assist;+2 safety/equipment         General transfer comment: cues for transition position and use of UEs to self assist  Ambulation/Gait Ambulation/Gait assistance: Min assist;Mod assist;+2 physical assistance;+2 safety/equipment Gait Distance (Feet): 5 Feet Assistive device: Rolling walker (2 wheeled) Gait Pattern/deviations: Step-to pattern;Wide base of support;Decreased stride length;Decreased weight shift to right Gait velocity: very decr, multiple pauses    General Gait Details: Cues for posture, sequence and position from RW.  Pt with difficulty fully extending R knee in standing - shoe on L foot with  marked improvement in R knee ext   Stairs             Wheelchair Mobility    Modified Rankin (Stroke Patients Only)       Balance Overall balance assessment: Needs assistance Sitting-balance support: Feet supported Sitting balance-Leahy Scale: Good     Standing balance support: Bilateral upper extremity supported Standing balance-Leahy Scale: Poor Standing balance comment: relies on RW and PT support in standing                            Cognition Arousal/Alertness: Awake/alert Behavior During Therapy: WFL for tasks assessed/performed Overall Cognitive Status: Within Functional Limits for tasks assessed                                        Exercises Total Joint Exercises Ankle Circles/Pumps: AROM;Both;Supine;20 reps Quad Sets: AROM;Right;Supine;10 reps Heel Slides: AAROM;Right;20 reps;Supine Hip ABduction/ADduction: AAROM;Right;15 reps;Supine Long Arc Quad: AAROM;Right;10 reps;Seated    General Comments        Pertinent Vitals/Pain Pain Assessment: 0-10 Pain Score: 2  Pain Location: R hip Pain Descriptors / Indicators: Aching;Sore Pain Intervention(s): Limited activity within patient's tolerance;Monitored during session;Premedicated before session;Ice applied    Home Living                      Prior Function            PT Goals (current goals can now be found in the care plan section)  Acute Rehab PT Goals Patient Stated Goal: Regain IND PT Goal Formulation: With patient Time For Goal Achievement: 10/01/17 Potential to Achieve Goals: Good Progress towards PT goals: Progressing toward goals    Frequency    7X/week      PT Plan Current plan remains appropriate    Co-evaluation              AM-PAC PT "6 Clicks" Daily Activity  Outcome Measure  Difficulty turning over in bed (including adjusting bedclothes, sheets and blankets)?: Unable Difficulty moving from lying on back to sitting on the  side of the bed? : Unable Difficulty sitting down on and standing up from a chair with arms (e.g., wheelchair, bedside commode, etc,.)?: Unable Help needed moving to and from a bed to chair (including a wheelchair)?: A Lot Help needed walking in hospital room?: A Lot Help needed climbing 3-5 steps with a railing? : Total 6 Click Score: 8    End of Session Equipment Utilized During Treatment: Gait belt Activity Tolerance: Patient tolerated treatment well;Patient limited by fatigue Patient left: in chair;with call bell/phone within reach;with family/visitor present Nurse Communication: Mobility status PT Visit Diagnosis: Unsteadiness on feet (R26.81);Difficulty in walking, not elsewhere classified (R26.2)     Time: 1829-9371 PT Time Calculation (min) (ACUTE ONLY): 40 min  Charges:  $Gait Training: 8-22 mins $Therapeutic Exercise: 8-22 mins $Therapeutic Activity: 8-22 mins                     [facility] Pager [phone] Office [phone]    Makaya Juneau 09/19/2017, 1:24 PM

## 2017-09-19 NOTE — Progress Notes (Signed)
Physical Therapy Treatment Patient Details Name: Traci Richardson MRN: [id number] DOB: Nov 02, 1925 Today's Date: 09/19/2017    History of Present Illness Pt is a 84 YO female s/p R DA-THA on 09/17/17. PMH includes endometrial cancer 2015, AMS, fever, lumbar compression fracture, anxiety, DDD. Surgical history includes cataracts, L knee scope, lap chole 1990s    PT Comments     Pt continues very cooperative but limited by fatigue.  Pt requiring increased time and assist for all tasks and demonstrating increased difficulty following cues - RN aware.  This session pt stood x 2 to work on balance, wt shifts, posture, UE WB, R knee extension and standing tolerance.  Pt scheduled for dc home with HEP only but would greatly benefit from follow up at SNF level rehab vs HHPT intervention  Follow Up Recommendations  Follow surgeon's recommendation for DC plan and follow-up therapies;Supervision for mobility/OOB     Equipment Recommendations  Rolling walker with 5" wheels;3in1 (PT)    Recommendations for Other Services       Precautions / Restrictions Precautions Precautions: Fall Restrictions Weight Bearing Restrictions: No Other Position/Activity Restrictions: WBAT     Mobility  Bed Mobility Overal bed mobility: Needs Assistance Bed Mobility: Supine to Sit;Sit to Supine     Supine to sit: Min assist;Mod assist;+2 for physical assistance Sit to supine: Mod assist;+2 for physical assistance   General bed mobility comments: Increased time with cues for sequence and use of L LE to self assist.  Physical assist to manage LEs and to control trunk.  Pt agreeable to all cues but with slow and limited follow through  Transfers Overall transfer level: Needs assistance Equipment used: Rolling walker (2 wheeled) Transfers: Sit to/from Stand Sit to Stand: Min assist;+2 safety/equipment;From elevated surface         General transfer comment: cues for transition position and use of UEs to self  assist  Ambulation/Gait Ambulation/Gait assistance: Min assist;Mod assist;+2 physical assistance;+2 safety/equipment Gait Distance (Feet): 5 Feet Assistive device: Rolling walker (2 wheeled) Gait Pattern/deviations: Step-to pattern;Wide base of support;Decreased stride length;Decreased weight shift to right Gait velocity: very decr, multiple pauses    General Gait Details: NT - standing at bedside only to work on posture, wt shifts, R knee extension, use of UEs for support, and standing tolerance   Stairs             Wheelchair Mobility    Modified Rankin (Stroke Patients Only)       Balance Overall balance assessment: Needs assistance Sitting-balance support: Feet supported Sitting balance-Leahy Scale: Fair Sitting balance - Comments: can sit without UE use, prefers not to. Cannot tolerate challenge to balance.    Standing balance support: Bilateral upper extremity supported Standing balance-Leahy Scale: Poor Standing balance comment: relies on RW and PT support in standing                            Cognition Arousal/Alertness: Awake/alert Behavior During Therapy: WFL for tasks assessed/performed Overall Cognitive Status: Impaired/Different from baseline Area of Impairment: Following commands;Problem solving                       Following Commands: Follows one step commands inconsistently     Problem Solving: Slow processing;Decreased initiation        Exercises Total Joint Exercises Ankle Circles/Pumps: AROM;Both;Supine;20 reps Quad Sets: AROM;Right;Supine;10 reps Heel Slides: AAROM;Right;20 reps;Supine Hip ABduction/ADduction: AAROM;Right;15 reps;Supine Long Arc Quad: AAROM;Right;10  reps;Seated    General Comments        Pertinent Vitals/Pain Pain Assessment: 0-10 Pain Score: 2  Pain Location: R hip Pain Descriptors / Indicators: Aching;Sore Pain Intervention(s): Limited activity within patient's tolerance;Monitored during  session;Premedicated before session;Ice applied    Home Living                      Prior Function            PT Goals (current goals can now be found in the care plan section) Acute Rehab PT Goals Patient Stated Goal: Regain IND PT Goal Formulation: With patient Time For Goal Achievement: 10/01/17 Potential to Achieve Goals: Good Progress towards PT goals: Not progressing toward goals - comment(ltd by fatigue)    Frequency    7X/week      PT Plan Current plan remains appropriate    Co-evaluation              AM-PAC PT "6 Clicks" Daily Activity  Outcome Measure  Difficulty turning over in bed (including adjusting bedclothes, sheets and blankets)?: Unable Difficulty moving from lying on back to sitting on the side of the bed? : Unable Difficulty sitting down on and standing up from a chair with arms (e.g., wheelchair, bedside commode, etc,.)?: Unable Help needed moving to and from a bed to chair (including a wheelchair)?: A Lot Help needed walking in hospital room?: Total Help needed climbing 3-5 steps with a railing? : Total 6 Click Score: 7    End of Session Equipment Utilized During Treatment: Gait belt Activity Tolerance: Patient limited by fatigue Patient left: in bed;with call bell/phone within reach;with family/visitor present Nurse Communication: Mobility status PT Visit Diagnosis: Unsteadiness on feet (R26.81);Difficulty in walking, not elsewhere classified (R26.2)     Time: 1607-3710 PT Time Calculation (min) (ACUTE ONLY): 35 min  Charges:  $Gait Training: 8-22 mins $Therapeutic Exercise: 8-22 mins $Therapeutic Activity: 23-37 mins                     Debe Coder PT Acute Rehabilitation Services Pager [phone] Office [phone]    Eli Pattillo 09/19/2017, 4:36 PM

## 2017-09-19 NOTE — Progress Notes (Signed)
Family member request HHN since patients mobility is limited.

## 2017-09-19 NOTE — Plan of Care (Signed)
Patient desires to be discharged to home, but is very unsteady ambulating to bedside commode.  Requires two assist and gait belt and even then seems unable to follow commands and move in a safe manner.  Daughter at bedside.

## 2017-09-20 LAB — CBC
HCT: 30.7 % — ABNORMAL LOW (ref 36.0–46.0)
Hemoglobin: 10.2 g/dL — ABNORMAL LOW (ref 12.0–15.0)
MCH: 32.2 pg (ref 26.0–34.0)
MCHC: 33.2 g/dL (ref 30.0–36.0)
MCV: 96.8 fL (ref 78.0–100.0)
Platelets: 128 10*3/uL — ABNORMAL LOW (ref 150–400)
RBC: 3.17 MIL/uL — ABNORMAL LOW (ref 3.87–5.11)
RDW: 13 % (ref 11.5–15.5)
WBC: 6.9 10*3/uL (ref 4.0–10.5)

## 2017-09-20 NOTE — Progress Notes (Signed)
Physical Therapy Treatment Patient Details Name: Traci Richardson MRN: [id number] DOB: 14-Oct-1925 Today's Date: 09/20/2017    History of Present Illness Pt is a 84 YO female s/p R DA-THA on 09/17/17. PMH includes endometrial cancer 2015, AMS, fever, lumbar compression fracture, anxiety, DDD. Surgical history includes cataracts, L knee scope, lap chole 1990s    PT Comments    Patient seen x2 today for skilled PT treatment to progress patient functional mobility. At prior session, PT educating patient on need to begin taking short distance walking to restroom with patient and family member reporting completion of this x3 with nursing staff. Patient able to progress gait distance this afternoon, but with continued difficulty with R LE foot clearance and need for close min guard for safety and stability concerns.    Follow Up Recommendations  Follow surgeon's recommendation for DC plan and follow-up therapies;Supervision for mobility/OOB     Equipment Recommendations  Rolling walker with 5" wheels;3in1 (PT)    Recommendations for Other Services       Precautions / Restrictions Precautions Precautions: Fall Restrictions Weight Bearing Restrictions: No Other Position/Activity Restrictions: WBAT     Mobility  Bed Mobility               General bed mobility comments: up in recliner  Transfers Overall transfer level: Needs assistance Equipment used: Rolling walker (2 wheeled) Transfers: Sit to/from Stand Sit to Stand: Min guard         General transfer comment: min guard with verbal cueing needed for hand placement prior to transfer  Ambulation/Gait Ambulation/Gait assistance: Min assist;Min guard;+2 safety/equipment Gait Distance (Feet): 22 Feet Assistive device: Rolling walker (2 wheeled) Gait Pattern/deviations: Step-to pattern;Decreased stride length;Decreased step length - right;Decreased weight shift to right;Wide base of support Gait velocity: decreased   General  Gait Details: difficulty with R LE foot clearance; declined wearing shoes as prior notes this assisted with R LE foot clearance; requires motivational cueing to progress mobility; poor R knee extension in stance phase of gait   Stairs             Wheelchair Mobility    Modified Rankin (Stroke Patients Only)       Balance Overall balance assessment: Needs assistance Sitting-balance support: Feet supported Sitting balance-Leahy Scale: Fair     Standing balance support: Bilateral upper extremity supported;During functional activity Standing balance-Leahy Scale: Poor Standing balance comment: reliant on external support                            Cognition Arousal/Alertness: Awake/alert Behavior During Therapy: WFL for tasks assessed/performed Overall Cognitive Status: Within Functional Limits for tasks assessed                                        Exercises Total Joint Exercises Ankle Circles/Pumps: Both;10 reps;Seated Long Arc Quad: Right;10 reps;Seated Marching in Standing: Right;5 reps Other Exercises Other Exercises: sit to stand from recliner x 5    General Comments General comments (skin integrity, edema, etc.): education to perform bed level ther ex this even independently/with family as patient is very fatigued following gait      Pertinent Vitals/Pain Pain Assessment: No/denies pain    Home Living                      Prior Function  PT Goals (current goals can now be found in the care plan section) Acute Rehab PT Goals Patient Stated Goal: Regain IND PT Goal Formulation: With patient Time For Goal Achievement: 10/01/17 Potential to Achieve Goals: Good Progress towards PT goals: Progressing toward goals    Frequency    7X/week      PT Plan Current plan remains appropriate    Co-evaluation              AM-PAC PT "6 Clicks" Daily Activity  Outcome Measure  Difficulty turning over  in bed (including adjusting bedclothes, sheets and blankets)?: Unable Difficulty moving from lying on back to sitting on the side of the bed? : Unable Difficulty sitting down on and standing up from a chair with arms (e.g., wheelchair, bedside commode, etc,.)?: Unable Help needed moving to and from a bed to chair (including a wheelchair)?: A Little Help needed walking in hospital room?: A Little Help needed climbing 3-5 steps with a railing? : A Lot 6 Click Score: 11    End of Session Equipment Utilized During Treatment: Gait belt Activity Tolerance: Patient limited by fatigue Patient left: in chair;with call bell/phone within reach;with family/visitor present Nurse Communication: Mobility status PT Visit Diagnosis: Unsteadiness on feet (R26.81);Difficulty in walking, not elsewhere classified (R26.2)     Time: 6599-3570 PT Time Calculation (min) (ACUTE ONLY): 14 min  Charges:  $Gait Training: 8-22 mins       Lanney Gins, PT, DPT Supplemental Physical Therapist 09/20/17 2:57 PM Pager: [phone] Office: [phone]

## 2017-09-20 NOTE — Progress Notes (Signed)
Subjective: 3 Days Post-Op Procedure(s) (LRB): RIGHT TOTAL HIP ARTHROPLASTY ANTERIOR APPROACH (Right) Patient reports pain as mild lying in bed. Located in right hip region. Tolerating PO's well. Limited progress with PT. Denies SOB or CP. IS being used.      Objective: Vital signs in last 24 hours: Temp:  [98.1 F (36.7 C)-99.5 F (37.5 C)] 98.1 F (36.7 C) (09/15 0508) Pulse Rate:  [85-97] 87 (09/15 0738) Resp:  [16-18] 16 (09/15 0508) BP: (124-186)/(77-99) 145/94 (09/15 0738) SpO2:  [93 %-98 %] 98 % (09/15 0738)  Intake/Output from previous day: 09/14 0701 - 09/15 0700 In: 500 [P.O.:500] Out: 310 [Urine:310] Intake/Output this shift: No intake/output data recorded.  Recent Labs    09/18/17 0354 09/19/17 0403 09/20/17 0415  HGB 9.9* 10.2* 10.2*   Recent Labs    09/19/17 0403 09/20/17 0415  WBC 8.2 6.9  RBC 3.15* 3.17*  HCT 30.5* 30.7*  PLT 136* 128*   Recent Labs    09/18/17 0354  NA 142  K 3.7  CL 111  CO2 26  BUN 13  CREATININE 0.95  GLUCOSE 142*  CALCIUM 8.2*   No results for input(s): LABPT, INR in the last 72 hours.  Well nourished. Alert and oriented x3. RRR, Lungs clear, BS x4. Abdomen soft and non tender. Right Calf soft and non tender. Right hip dressing C/D/I. No DVT signs. Compartment soft. No signs of infection.  Right LE grossly neurovascular intact.  Anticipated LOS equal to or greater than 2 midnights due to - Age 9 and older with one or more of the following:  - Obesity  - Expected need for hospital services (PT, OT, Nursing) required for safe  discharge  - Anticipated need for postoperative skilled nursing care or inpatient rehab  - Active co-morbidities: None OR   - Unanticipated findings during/Post Surgery: Slow post-op progression: GI, pain control, mobility  - Patient is a high risk of re-admission due to: None   Assessment/Plan: 3 Days Post-Op Procedure(s) (LRB): RIGHT TOTAL HIP ARTHROPLASTY ANTERIOR APPROACH  (Right) Advance diet Up with therapy Plan for discharge tomorrow  Pulmonary toilet  Continue current care Family updated at bedside     Haylei Cobin L 09/20/2017, 8:05 AM

## 2017-09-20 NOTE — Plan of Care (Signed)
Pt stable though remains weak overall. No needs this am. Pt overall better with encouragement from staff to get to chair from bsc. No needs at this time. Pt reports no pain. Family at bedside. Will continue to monitor.

## 2017-09-20 NOTE — Progress Notes (Signed)
Physical Therapy Treatment Patient Details Name: Traci Richardson MRN: [id number] DOB: 07/05/1925 Today's Date: 09/20/2017    History of Present Illness Pt is a 84 YO female s/p R DA-THA on 09/17/17. PMH includes endometrial cancer 2015, AMS, fever, lumbar compression fracture, anxiety, DDD. Surgical history includes cataracts, L knee scope, lap chole 1990s    PT Comments    Patient up in recliner. Family member present and stating the patient is much more her "normal" self today. Able to progress mobility up to 18 ft with RW, however limited by fatigue. Progression of sit to stand form Min A to min guard during visit demonstrating progress towards goals. Educated patient on short distance walking to bathroom multiple times a day to increase activity tolerance and mobility. Feel as if patient would benefit from higher level care at discharge vs HHPT due to continued need for assistance with all mobility.     Follow Up Recommendations  Follow surgeon's recommendation for DC plan and follow-up therapies;Supervision for mobility/OOB     Equipment Recommendations  Rolling walker with 5" wheels;3in1 (PT)    Recommendations for Other Services       Precautions / Restrictions Precautions Precautions: Fall Restrictions Weight Bearing Restrictions: No Other Position/Activity Restrictions: WBAT     Mobility  Bed Mobility               General bed mobility comments: up in recliner  Transfers Overall transfer level: Needs assistance Equipment used: Rolling walker (2 wheeled) Transfers: Sit to/from Stand Sit to Stand: Min assist;+2 physical assistance(progressed to min guard)         General transfer comment: initially Min a +2 to power up from recliner; able to progress to min guard by end of session  Ambulation/Gait Ambulation/Gait assistance: Min assist;+2 safety/equipment Gait Distance (Feet): 18 Feet Assistive device: Rolling walker (2 wheeled) Gait Pattern/deviations:  Step-to pattern;Decreased stride length;Decreased step length - right;Decreased weight shift to right;Wide base of support Gait velocity: decreased   General Gait Details: noted wide BOS throuhgout requiring VC to reduce; difficulty with forward progression of R LE    Stairs             Wheelchair Mobility    Modified Rankin (Stroke Patients Only)       Balance Overall balance assessment: Needs assistance Sitting-balance support: Feet supported Sitting balance-Leahy Scale: Fair     Standing balance support: Bilateral upper extremity supported;During functional activity Standing balance-Leahy Scale: Poor Standing balance comment: reliant on external support                            Cognition Arousal/Alertness: Awake/alert Behavior During Therapy: WFL for tasks assessed/performed Overall Cognitive Status: Within Functional Limits for tasks assessed                                        Exercises Total Joint Exercises Ankle Circles/Pumps: Both;10 reps;Seated Long Arc Quad: Right;10 reps;Seated Marching in Standing: Right;5 reps Other Exercises Other Exercises: sit to stand from recliner x 5    General Comments        Pertinent Vitals/Pain Pain Assessment: No/denies pain    Home Living                      Prior Function  PT Goals (current goals can now be found in the care plan section) Acute Rehab PT Goals Patient Stated Goal: Regain IND PT Goal Formulation: With patient Time For Goal Achievement: 10/01/17 Potential to Achieve Goals: Good Progress towards PT goals: Progressing toward goals    Frequency    7X/week      PT Plan Current plan remains appropriate    Co-evaluation              AM-PAC PT "6 Clicks" Daily Activity  Outcome Measure  Difficulty turning over in bed (including adjusting bedclothes, sheets and blankets)?: Unable Difficulty moving from lying on back to sitting  on the side of the bed? : Unable Difficulty sitting down on and standing up from a chair with arms (e.g., wheelchair, bedside commode, etc,.)?: Unable Help needed moving to and from a bed to chair (including a wheelchair)?: A Little Help needed walking in hospital room?: A Little Help needed climbing 3-5 steps with a railing? : A Lot 6 Click Score: 11    End of Session Equipment Utilized During Treatment: Gait belt Activity Tolerance: Patient limited by fatigue Patient left: in chair;with call bell/phone within reach;with family/visitor present Nurse Communication: Mobility status PT Visit Diagnosis: Unsteadiness on feet (R26.81);Difficulty in walking, not elsewhere classified (R26.2)     Time: 9371-6967 PT Time Calculation (min) (ACUTE ONLY): 26 min  Charges:  $Gait Training: 8-22 mins $Therapeutic Exercise: 8-22 mins                     Lanney Gins, PT, DPT Supplemental Physical Therapist 09/20/17 11:07 AM Pager: [phone] Office: [phone]

## 2017-09-20 NOTE — Plan of Care (Signed)
Patient remains limited with mobility.  Unable to take more than a step or two, even with 2 assist.

## 2017-09-21 NOTE — Progress Notes (Signed)
    Subjective:  Patient reports pain as mild to moderate.  Denies N/V/CP/SOB. Improved progress with PT.  Objective:   VITALS:   Vitals:   09/20/17 0738 09/20/17 1412 09/20/17 2112 09/21/17 0506  BP: (!) 145/94 128/83 (!) 149/97 (!) 160/97  Pulse: 87 100 92 82  Resp:  17 16 17   Temp:  98.8 F (37.1 C) 99.1 F (37.3 C) 98.1 F (36.7 C)  TempSrc:  Oral Oral Oral  SpO2: 98% 98% 98% 100%  Weight:      Height:        NAD ABD soft Sensation intact distally Intact pulses distally Dorsiflexion/Plantar flexion intact Incision: dressing C/D/I Compartment soft   Lab Results  Component Value Date   WBC 6.9 09/20/2017   HGB 10.2 (L) 09/20/2017   HCT 30.7 (L) 09/20/2017   MCV 96.8 09/20/2017   PLT 128 (L) 09/20/2017   BMET    Component Value Date/Time   NA 142 09/18/2017 0354   K 3.7 09/18/2017 0354   CL 111 09/18/2017 0354   CO2 26 09/18/2017 0354   GLUCOSE 142 (H) 09/18/2017 0354   BUN 13 09/18/2017 0354   CREATININE 0.95 09/18/2017 0354   CALCIUM 8.2 (L) 09/18/2017 0354   GFRNONAA 51 (L) 09/18/2017 0354   GFRAA 59 (L) 09/18/2017 0354     Assessment/Plan: 4 Days Post-Op   Principal Problem:   Osteoarthritis of right hip   WBAT with walker DVT ppx: Aspirin, SCDs, TEDS PO pain control PT/OT Dispo: D/C home with HEP today   Traci Richardson 09/21/2017, 12:45 PM   Rod Can, MD Cell [phone]

## 2017-09-21 NOTE — Care Management Important Message (Signed)
Important Message  Patient Details  Name: Traci Richardson MRN: [id number] Date of Birth: 11-30-1925   Medicare Important Message Given:  Yes    Kerin Salen 09/21/2017, 12:22 [place] Message  Patient Details  Name: Traci Richardson MRN: [id number] Date of Birth: February 02, 1925   Medicare Important Message Given:  Yes    Kerin Salen 09/21/2017, 12:22 PM

## 2017-09-21 NOTE — Progress Notes (Signed)
Physical Therapy Treatment Patient Details Name: Traci Richardson MRN: [id number] DOB: October 22, 1925 Today's Date: 09/21/2017    History of Present Illness Pt is a 84 YO female s/p R DA-THA on 09/17/17. PMH includes endometrial cancer 2015, AMS, fever, lumbar compression fracture, anxiety, DDD. Surgical history includes cataracts, L knee scope, lap chole 1990s    PT Comments    POD # 4 Applied B shoes.  Assisted with amb to bathroom pt able to self advance R LE.  Assisted in bathroom for hygiene.  Assisted with amb a short distance in hallway.  performed some THR TE's followed by ICE.    Follow Up Recommendations  Follow surgeon's recommendation for DC plan and follow-up therapies;Supervision for mobility/OOB     Equipment Recommendations  Rolling walker with 5" wheels;3in1 (PT)    Recommendations for Other Services       Precautions / Restrictions Precautions Precautions: Fall Restrictions Weight Bearing Restrictions: No Other Position/Activity Restrictions: WBAT     Mobility  Bed Mobility               General bed mobility comments: OOB in recliner  Transfers Overall transfer level: Needs assistance Equipment used: Rolling walker (2 wheeled) Transfers: Sit to/from Stand Sit to Stand: Min guard;Min assist         General transfer comment: min guard with verbal cueing needed for hand placement prior to transfer  Ambulation/Gait Ambulation/Gait assistance: Min assist;Min guard Gait Distance (Feet): 28 Feet Assistive device: Rolling walker (2 wheeled) Gait Pattern/deviations: Step-to pattern;Step-through pattern Gait velocity: decreased   General Gait Details: 50% VC's on proper sequencing.  Increased abilty to self advance R LE.  Assisted with amb to bathroom then a short distance in hallway.     Stairs             Wheelchair Mobility    Modified Rankin (Stroke Patients Only)       Balance                                            Cognition Arousal/Alertness: Awake/alert Behavior During Therapy: WFL for tasks assessed/performed Overall Cognitive Status: Within Functional Limits for tasks assessed                                 General Comments: increased cognition, appears at baseline      Exercises   Total Hip Replacement TE's 10 reps ankle pumps 10 reps knee presses 10 reps heel slides 10 reps SAQ's 10 reps ABD Followed by ICE     General Comments        Pertinent Vitals/Pain Pain Assessment: Faces Faces Pain Scale: Hurts little more Pain Location: R hip Pain Descriptors / Indicators: Aching;Sore Pain Intervention(s): Monitored during session;Repositioned;Ice applied    Home Living                      Prior Function            PT Goals (current goals can now be found in the care plan section) Progress towards PT goals: Progressing toward goals    Frequency    7X/week      PT Plan Current plan remains appropriate    Co-evaluation              AM-PAC PT "  6 Clicks" Daily Activity  Outcome Measure  Difficulty turning over in bed (including adjusting bedclothes, sheets and blankets)?: A Lot Difficulty moving from lying on back to sitting on the side of the bed? : A Lot Difficulty sitting down on and standing up from a chair with arms (e.g., wheelchair, bedside commode, etc,.)?: A Lot Help needed moving to and from a bed to chair (including a wheelchair)?: A Lot Help needed walking in hospital room?: A Lot Help needed climbing 3-5 steps with a railing? : A Lot 6 Click Score: 12    End of Session Equipment Utilized During Treatment: Gait belt Activity Tolerance: Patient tolerated treatment well Patient left: in chair;with call bell/phone within reach;with family/visitor present Nurse Communication: Mobility status PT Visit Diagnosis: Unsteadiness on feet (R26.81);Difficulty in walking, not elsewhere classified (R26.2)     Time: 7903-8333 PT  Time Calculation (min) (ACUTE ONLY): 25 min  Charges:  $Gait Training: 8-22 mins $Therapeutic Exercise: 8-22 mins                     Rica Koyanagi  PTA Acute  Rehabilitation Services Pager      [phone] Office      [phone]

## 2017-09-30 DIAGNOSIS — M6281 Muscle weakness (generalized): Secondary | ICD-10-CM | POA: Diagnosis not present

## 2017-09-30 DIAGNOSIS — R262 Difficulty in walking, not elsewhere classified: Secondary | ICD-10-CM | POA: Diagnosis not present

## 2017-09-30 DIAGNOSIS — R2681 Unsteadiness on feet: Secondary | ICD-10-CM | POA: Diagnosis not present

## 2017-10-01 ENCOUNTER — Ambulatory Visit: Payer: Self-pay | Admitting: Radiation Oncology

## 2017-10-02 DIAGNOSIS — M1611 Unilateral primary osteoarthritis, right hip: Secondary | ICD-10-CM | POA: Diagnosis not present

## 2017-10-05 ENCOUNTER — Encounter: Payer: Self-pay | Admitting: Radiation Oncology

## 2017-10-05 ENCOUNTER — Ambulatory Visit
Admission: RE | Admit: 2017-10-05 | Discharge: 2017-10-05 | Disposition: A | Discharge status: Home / Self Care | Payer: Medicare Other | Source: Ambulatory Visit | Attending: Radiation Oncology | Admitting: Radiation Oncology

## 2017-10-05 ENCOUNTER — Other Ambulatory Visit: Payer: Self-pay

## 2017-10-05 VITALS — BP 131/84 | HR 86 | Temp 98.1°F | Resp 20 | Ht 62.0 in | Wt 193.6 lb

## 2017-10-05 DIAGNOSIS — C541 Malignant neoplasm of endometrium: Secondary | ICD-10-CM | POA: Insufficient documentation

## 2017-10-05 DIAGNOSIS — Z923 Personal history of irradiation: Secondary | ICD-10-CM | POA: Insufficient documentation

## 2017-10-05 DIAGNOSIS — M1990 Unspecified osteoarthritis, unspecified site: Secondary | ICD-10-CM | POA: Insufficient documentation

## 2017-10-05 DIAGNOSIS — Z882 Allergy status to sulfonamides status: Secondary | ICD-10-CM | POA: Insufficient documentation

## 2017-10-05 DIAGNOSIS — E039 Hypothyroidism, unspecified: Secondary | ICD-10-CM | POA: Insufficient documentation

## 2017-10-05 DIAGNOSIS — Z885 Allergy status to narcotic agent status: Secondary | ICD-10-CM | POA: Insufficient documentation

## 2017-10-05 DIAGNOSIS — Z888 Allergy status to other drugs, medicaments and biological substances status: Secondary | ICD-10-CM | POA: Insufficient documentation

## 2017-10-05 DIAGNOSIS — R2681 Unsteadiness on feet: Secondary | ICD-10-CM | POA: Diagnosis not present

## 2017-10-05 DIAGNOSIS — M6281 Muscle weakness (generalized): Secondary | ICD-10-CM | POA: Diagnosis not present

## 2017-10-05 DIAGNOSIS — R262 Difficulty in walking, not elsewhere classified: Secondary | ICD-10-CM | POA: Diagnosis not present

## 2017-10-05 DIAGNOSIS — Z79899 Other long term (current) drug therapy: Secondary | ICD-10-CM | POA: Insufficient documentation

## 2017-10-05 DIAGNOSIS — Z79890 Hormone replacement therapy (postmenopausal): Secondary | ICD-10-CM | POA: Insufficient documentation

## 2017-10-05 DIAGNOSIS — Z08 Encounter for follow-up examination after completed treatment for malignant neoplasm: Secondary | ICD-10-CM | POA: Diagnosis not present

## 2017-10-05 DIAGNOSIS — Z7982 Long term (current) use of aspirin: Secondary | ICD-10-CM | POA: Diagnosis not present

## 2017-10-05 DIAGNOSIS — Z8542 Personal history of malignant neoplasm of other parts of uterus: Secondary | ICD-10-CM | POA: Diagnosis not present

## 2017-10-05 NOTE — Progress Notes (Signed)
Radiation Oncology         [phone]) [phone] ________________________________  Name: Traci Richardson MRN: [id number]  Date: 10/05/2017  DOB: 1925-12-18  Follow-Up Visit Note  CC: Reynold Bowen, MD  Everitt Amber, MD    ICD-10-CM   1. Endometrial adenocarcinoma (HCC) C54.1     Diagnosis: Stage IB grade I endometrial carcinoma   Interval Since Last Radiation: 1 year  Radiation Treatment Dates: 08/21/16-09/18/16:  Site/Dose: Vaginal Cuff Brachytherapy to total 30 Gy in 5 fractions  Narrative:  Ms. Woodside is a pleasant 84 y.o. female with a history of endometrial cancer. She is accompanied by her family member today.   She recently had a right hip replacement on 09/17/2017 with a 5 day admission. She is feeling well following her recent surgery. She saw Dr. Denman George approximately 3 months ago. She had a follow up appointment with Dr. Denman George on 12.27/2019.   She reports associated right leg pain s/p surgery. She denies any other symptoms.   Past Medical History:  Past Medical History:  Diagnosis Date  . Anxiety   . Arthritis    right hip,  shoulders, right knee  . DDD (degenerative disc disease), lumbosacral    multilevel  . Depression   . Endometrial carcinoma [facility]) oncologist-  dr Denman George-- per lov note in epic , no recurrence   dx 06/ 2018--  Stage IB, Grade 1--  s/p TAH w/ BSO, node dissection 07-17-2016 and completed radiation 09-18-2016  . History of colitis 03/2012  . History of radiation therapy 08/21/16, 08/08/16, 09/04/16, 09/11/16, 09/18/16   Bracytherapy HDR to the vaginal cuff 30 Gy in 5 fractions (endometrial cancer)  . Hyperlipidemia   . Hypothyroidism    endocrinologist--- dr Forde Dandy  . Nocturia   . Wears glasses   . Wears partial dentures    upper    Past Surgical History: Past Surgical History:  Procedure Laterality Date  . CATARACT EXTRACTION W/ INTRAOCULAR LENS  IMPLANT, BILATERAL  last 1990s  . DILATATION & CURETTAGE/HYSTEROSCOPY WITH MYOSURE N/A 07/01/2016   Procedure:  DILATATION & CURETTAGE/HYSTEROSCOPY WITH MYOSURE;  Surgeon: Servando Salina, MD;  Location: [facility];  Service: Gynecology;  Laterality: N/A;  . INCISIONAL HERNIA REPAIR  03-02-2001   dr d. Ninfa Linden @[facility]   . KNEE ARTHROSCOPY W/ MENISCAL REPAIR Left 2014 approx.  Marland Kitchen LAPAROSCOPIC CHOLECYSTECTOMY  early 22s  . ROBOTIC ASSISTED TOTAL HYSTERECTOMY WITH BILATERAL SALPINGO OOPHERECTOMY N/A 07/17/2016   Procedure: ROBOTIC ASSISTED TOTAL HYSTERECTOMY WITH BILATERAL SALPINGO OOPHORECTOMY AND LYSIS OF ADHESIONS;  Surgeon: Everitt Amber, MD;  Location: [facility];  Service: Gynecology;  Laterality: N/A;  . SENTINEL NODE BIOPSY N/A 07/17/2016   Procedure: SENTINEL NODE BIOPSY;  Surgeon: Everitt Amber, MD;  Location: [facility];  Service: Gynecology;  Laterality: N/A;  . TOTAL HIP ARTHROPLASTY Right 09/17/2017   Procedure: RIGHT TOTAL HIP ARTHROPLASTY ANTERIOR APPROACH;  Surgeon: Rod Can, MD;  Location: [facility];  Service: Orthopedics;  Laterality: Right;  Needs RNFA    Social History:  Social History   Socioeconomic History  . Marital status: Widowed    Spouse name: Not on file  . Number of children: 2  . Years of education: Not on file  . Highest education level: Not on file  Occupational History  . Not on file  Social Needs  . Financial resource strain: Not on file  . Food insecurity:    Worry: Not on file    Inability: Not on file  . Transportation needs:    Medical: Not  on file    Non-medical: Not on file  Tobacco Use  . Smoking status: Never Smoker  . Smokeless tobacco: Never Used  Substance and Sexual Activity  . Alcohol use: No  . Drug use: No  . Sexual activity: Never  Lifestyle  . Physical activity:    Days per week: Not on file    Minutes per session: Not on file  . Stress: Not on file  Relationships  . Social connections:    Talks on phone: Not on file    Gets together: Not on file    Attends religious service: Not on file    Active member of club or organization: Not on file      Attends meetings of clubs or organizations: Not on file    Relationship status: Not on file  . Intimate partner violence:    Fear of current or ex partner: Not on file    Emotionally abused: Not on file    Physically abused: Not on file    Forced sexual activity: Not on file  Other Topics Concern  . Not on file  Social History Narrative  . Not on file    Family History: Family History  Problem Relation Age of Onset  . Thyroid cancer Sister                               ALLERGIES:  is allergic to sulfa antibiotics; codeine; tramadol; and latex.  Meds: Current Outpatient Medications  Medication Sig Dispense Refill  . acetaminophen (TYLENOL) 500 MG tablet Take 1,000 mg by mouth 2 (two) times daily.    Marland Kitchen aspirin 81 MG chewable tablet Chew 1 tablet (81 mg total) by mouth 2 (two) times daily. 60 tablet 1  . busPIRone (BUSPAR) 5 MG tablet Take 5 mg by mouth daily after breakfast.    . docusate sodium (COLACE) 100 MG capsule Take 1 capsule (100 mg total) by mouth 2 (two) times daily. 60 capsule 1  . ezetimibe (ZETIA) 10 MG tablet Take 10 mg by mouth daily after breakfast.  6  . levothyroxine (SYNTHROID, LEVOTHROID) 100 MCG tablet Take 100 mcg by mouth daily before breakfast. On an empty stomach    . Polyethyl Glycol-Propyl Glycol (SYSTANE ULTRA) 0.4-0.3 % SOLN Place 1-2 drops into both eyes daily as needed (for dry eyes).    Marland Kitchen senna (SENOKOT) 8.6 MG TABS tablet Take 1 tablet (8.6 mg total) by mouth 2 (two) times daily. 120 each 0  . simvastatin (ZOCOR) 20 MG tablet Take 20 mg by mouth daily after breakfast.  6  . vitamin B-12 (CYANOCOBALAMIN) 500 MCG tablet Take 500 mcg by mouth daily at 3 pm.     . Vitamin D, Ergocalciferol, (DRISDOL) 50000 units CAPS capsule Take 50,000 Units by mouth 2 (two) times a week. Tuesday & Thursday after breakfast  3  . HYDROcodone-acetaminophen (NORCO/VICODIN) 5-325 MG tablet Take 1-2 tablets by mouth every 6 (six) hours as needed for moderate pain (pain  score 4-6). (Patient not taking: Reported on 10/05/2017) 30 tablet 0  . naproxen sodium (ANAPROX) 220 MG tablet Take 440 mg by mouth daily after breakfast.    . ondansetron (ZOFRAN) 4 MG tablet Take 1 tablet (4 mg total) by mouth every 6 (six) hours as needed for nausea. (Patient not taking: Reported on 10/05/2017) 20 tablet 0   No current facility-administered medications for this encounter.     Physical Findings: Wt Readings  from Last 3 Encounters:  10/05/17 193 lb 9.6 oz (87.8 kg)  09/17/17 186 lb 2 oz (84.4 kg)  09/14/17 186 lb 2 oz (84.4 kg)   Temp Readings from Last 3 Encounters:  10/05/17 98.1 F (36.7 C) (Oral)  09/21/17 98.1 F (36.7 C) (Oral)  09/14/17 98.3 F (36.8 C) (Oral)   BP Readings from Last 3 Encounters:  10/05/17 131/84  09/21/17 (!) 144/83  09/14/17 (!) 141/88   Pulse Readings from Last 3 Encounters:  10/05/17 86  09/21/17 92  09/14/17 73   Lungs are clear to auscultation bilaterally. Heart has regular rate and rhythm. No palpable cervical, supraclavicular, or axillary adenopathy. Abdomen soft, non-tender, normal bowel sounds. On pelvic examination the external genitalia were unremarkable. A speculum exam was performed. There are no mucosal lesions noted in the vaginal vault. On bimanual  examination there were no pelvic masses appreciated.   Lab Findings: Lab Results  Component Value Date   WBC 6.9 09/20/2017   HGB 10.2 (L) 09/20/2017   HCT 30.7 (L) 09/20/2017   MCV 96.8 09/20/2017   PLT 128 (L) 09/20/2017    Radiographic Findings: Dg Pelvis Portable  Result Date: 09/17/2017 CLINICAL DATA:  Postop right hip replacement EXAM: PORTABLE PELVIS 1-2 VIEWS COMPARISON:  09/17/2017 FINDINGS: Changes of right hip replacement. No hardware or bony complicating feature. Normal AP alignment. IMPRESSION: Right hip replacement.  No visible complicating feature. Electronically Signed   By: Rolm Baptise M.D.   On: 09/17/2017 10:53   Dg C-arm 1-60 Min-no  Report  Result Date: 09/17/2017 Fluoroscopy was utilized by the requesting physician.  No radiographic interpretation.   Dg Hip Operative Unilat W Or W/o Pelvis Right  Result Date: 09/17/2017 CLINICAL DATA:  Right-sided hip replacement EXAM: OPERATIVE right HIP (WITH PELVIS IF PERFORMED) 2 VIEWS TECHNIQUE: Fluoroscopic spot image(s) were submitted for interpretation post-operatively. COMPARISON:  None. FINDINGS: Two-view show total hip replacement on the right. Components appear well positioned. No radiographically detectable complication. IMPRESSION: Good appearance following right hip replacement. Electronically Signed   By: Nelson Chimes M.D.   On: 09/17/2017 10:45    Impression: No evidence of recurrence on clinical exam.   Plan: Patient will follow up with Dr. Denman George in 3 months and Radiation Oncology in 6 months.   -----------------------------------  Blair Promise, PhD, MD  This document serves as a record of services personally performed by Gery Pray, MD. It was created on his behalf by [facility], a trained medical scribe. The creation of this record is based on the scribe's personal observations and the provider's statements to them. This document has been checked and approved by the attending provider.

## 2017-10-05 NOTE — Progress Notes (Signed)
Traci Richardson presents today for f/u with Dr. Sondra Come. Pt is accompanied by daughter. Pt denies dysuria/hematuria. Pt denies vaginal bleeding/discharge. Pt denies rectal bleeding, or diarrhea. Pt reports slight constipation from recent hip surgery and pain medications. Pt denies abdominal bloating, N/V.   BP 131/84 (BP Location: Right Arm, Patient Position: Sitting)   Pulse 86   Temp 98.1 F (36.7 C) (Oral)   Resp 20   Ht 5\' 2"  (1.575 m)   Wt 193 lb 9.6 oz (87.8 kg)   SpO2 99%   BMI 35.41 kg/m   Wt Readings from Last 3 Encounters:  10/05/17 193 lb 9.6 oz (87.8 kg)  09/17/17 186 lb 2 oz (84.4 kg)  09/14/17 186 lb 2 oz (84.4 kg)   Loma Sousa, RN BSN

## 2017-10-06 DIAGNOSIS — R262 Difficulty in walking, not elsewhere classified: Secondary | ICD-10-CM | POA: Diagnosis not present

## 2017-10-06 DIAGNOSIS — R2681 Unsteadiness on feet: Secondary | ICD-10-CM | POA: Diagnosis not present

## 2017-10-06 DIAGNOSIS — M6281 Muscle weakness (generalized): Secondary | ICD-10-CM | POA: Diagnosis not present

## 2017-10-08 DIAGNOSIS — R262 Difficulty in walking, not elsewhere classified: Secondary | ICD-10-CM | POA: Diagnosis not present

## 2017-10-08 DIAGNOSIS — R2681 Unsteadiness on feet: Secondary | ICD-10-CM | POA: Diagnosis not present

## 2017-10-08 DIAGNOSIS — M6281 Muscle weakness (generalized): Secondary | ICD-10-CM | POA: Diagnosis not present

## 2017-10-09 DIAGNOSIS — R262 Difficulty in walking, not elsewhere classified: Secondary | ICD-10-CM | POA: Diagnosis not present

## 2017-10-09 DIAGNOSIS — R2681 Unsteadiness on feet: Secondary | ICD-10-CM | POA: Diagnosis not present

## 2017-10-09 DIAGNOSIS — M6281 Muscle weakness (generalized): Secondary | ICD-10-CM | POA: Diagnosis not present

## 2017-10-12 DIAGNOSIS — R2681 Unsteadiness on feet: Secondary | ICD-10-CM | POA: Diagnosis not present

## 2017-10-12 DIAGNOSIS — M6281 Muscle weakness (generalized): Secondary | ICD-10-CM | POA: Diagnosis not present

## 2017-10-12 DIAGNOSIS — R262 Difficulty in walking, not elsewhere classified: Secondary | ICD-10-CM | POA: Diagnosis not present

## 2017-10-19 DIAGNOSIS — M1712 Unilateral primary osteoarthritis, left knee: Secondary | ICD-10-CM | POA: Diagnosis not present

## 2017-10-19 DIAGNOSIS — Z471 Aftercare following joint replacement surgery: Secondary | ICD-10-CM | POA: Diagnosis not present

## 2017-10-20 DIAGNOSIS — R262 Difficulty in walking, not elsewhere classified: Secondary | ICD-10-CM | POA: Diagnosis not present

## 2017-10-20 DIAGNOSIS — R2681 Unsteadiness on feet: Secondary | ICD-10-CM | POA: Diagnosis not present

## 2017-10-20 DIAGNOSIS — M6281 Muscle weakness (generalized): Secondary | ICD-10-CM | POA: Diagnosis not present

## 2017-10-21 DIAGNOSIS — R262 Difficulty in walking, not elsewhere classified: Secondary | ICD-10-CM | POA: Diagnosis not present

## 2017-10-21 DIAGNOSIS — M6281 Muscle weakness (generalized): Secondary | ICD-10-CM | POA: Diagnosis not present

## 2017-10-21 DIAGNOSIS — R2681 Unsteadiness on feet: Secondary | ICD-10-CM | POA: Diagnosis not present

## 2017-10-22 DIAGNOSIS — M6281 Muscle weakness (generalized): Secondary | ICD-10-CM | POA: Diagnosis not present

## 2017-10-22 DIAGNOSIS — R262 Difficulty in walking, not elsewhere classified: Secondary | ICD-10-CM | POA: Diagnosis not present

## 2017-10-22 DIAGNOSIS — R2681 Unsteadiness on feet: Secondary | ICD-10-CM | POA: Diagnosis not present

## 2017-10-23 DIAGNOSIS — R262 Difficulty in walking, not elsewhere classified: Secondary | ICD-10-CM | POA: Diagnosis not present

## 2017-10-23 DIAGNOSIS — M6281 Muscle weakness (generalized): Secondary | ICD-10-CM | POA: Diagnosis not present

## 2017-10-23 DIAGNOSIS — R2681 Unsteadiness on feet: Secondary | ICD-10-CM | POA: Diagnosis not present

## 2017-10-26 DIAGNOSIS — R262 Difficulty in walking, not elsewhere classified: Secondary | ICD-10-CM | POA: Diagnosis not present

## 2017-10-26 DIAGNOSIS — R2681 Unsteadiness on feet: Secondary | ICD-10-CM | POA: Diagnosis not present

## 2017-10-26 DIAGNOSIS — M6281 Muscle weakness (generalized): Secondary | ICD-10-CM | POA: Diagnosis not present

## 2017-10-27 DIAGNOSIS — R262 Difficulty in walking, not elsewhere classified: Secondary | ICD-10-CM | POA: Diagnosis not present

## 2017-10-27 DIAGNOSIS — R2681 Unsteadiness on feet: Secondary | ICD-10-CM | POA: Diagnosis not present

## 2017-10-27 DIAGNOSIS — M6281 Muscle weakness (generalized): Secondary | ICD-10-CM | POA: Diagnosis not present

## 2017-10-30 DIAGNOSIS — R2681 Unsteadiness on feet: Secondary | ICD-10-CM | POA: Diagnosis not present

## 2017-10-30 DIAGNOSIS — M6281 Muscle weakness (generalized): Secondary | ICD-10-CM | POA: Diagnosis not present

## 2017-10-30 DIAGNOSIS — R262 Difficulty in walking, not elsewhere classified: Secondary | ICD-10-CM | POA: Diagnosis not present

## 2017-11-02 DIAGNOSIS — R262 Difficulty in walking, not elsewhere classified: Secondary | ICD-10-CM | POA: Diagnosis not present

## 2017-11-02 DIAGNOSIS — Z96641 Presence of right artificial hip joint: Secondary | ICD-10-CM | POA: Diagnosis not present

## 2017-11-02 DIAGNOSIS — R2681 Unsteadiness on feet: Secondary | ICD-10-CM | POA: Diagnosis not present

## 2017-11-02 DIAGNOSIS — M1611 Unilateral primary osteoarthritis, right hip: Secondary | ICD-10-CM | POA: Diagnosis not present

## 2017-11-02 DIAGNOSIS — Z471 Aftercare following joint replacement surgery: Secondary | ICD-10-CM | POA: Diagnosis not present

## 2017-11-02 DIAGNOSIS — M6281 Muscle weakness (generalized): Secondary | ICD-10-CM | POA: Diagnosis not present

## 2017-11-03 DIAGNOSIS — R2681 Unsteadiness on feet: Secondary | ICD-10-CM | POA: Diagnosis not present

## 2017-11-03 DIAGNOSIS — R262 Difficulty in walking, not elsewhere classified: Secondary | ICD-10-CM | POA: Diagnosis not present

## 2017-11-03 DIAGNOSIS — M6281 Muscle weakness (generalized): Secondary | ICD-10-CM | POA: Diagnosis not present

## 2017-11-05 DIAGNOSIS — R262 Difficulty in walking, not elsewhere classified: Secondary | ICD-10-CM | POA: Diagnosis not present

## 2017-11-05 DIAGNOSIS — R2681 Unsteadiness on feet: Secondary | ICD-10-CM | POA: Diagnosis not present

## 2017-11-05 DIAGNOSIS — M6281 Muscle weakness (generalized): Secondary | ICD-10-CM | POA: Diagnosis not present

## 2017-11-06 DIAGNOSIS — R262 Difficulty in walking, not elsewhere classified: Secondary | ICD-10-CM | POA: Diagnosis not present

## 2017-11-06 DIAGNOSIS — R2681 Unsteadiness on feet: Secondary | ICD-10-CM | POA: Diagnosis not present

## 2017-11-06 DIAGNOSIS — M6281 Muscle weakness (generalized): Secondary | ICD-10-CM | POA: Diagnosis not present

## 2017-11-10 DIAGNOSIS — R2681 Unsteadiness on feet: Secondary | ICD-10-CM | POA: Diagnosis not present

## 2017-11-10 DIAGNOSIS — R262 Difficulty in walking, not elsewhere classified: Secondary | ICD-10-CM | POA: Diagnosis not present

## 2017-11-10 DIAGNOSIS — M6281 Muscle weakness (generalized): Secondary | ICD-10-CM | POA: Diagnosis not present

## 2017-11-13 DIAGNOSIS — R262 Difficulty in walking, not elsewhere classified: Secondary | ICD-10-CM | POA: Diagnosis not present

## 2017-11-13 DIAGNOSIS — R2681 Unsteadiness on feet: Secondary | ICD-10-CM | POA: Diagnosis not present

## 2017-11-13 DIAGNOSIS — M6281 Muscle weakness (generalized): Secondary | ICD-10-CM | POA: Diagnosis not present

## 2017-11-16 DIAGNOSIS — R2681 Unsteadiness on feet: Secondary | ICD-10-CM | POA: Diagnosis not present

## 2017-11-16 DIAGNOSIS — R262 Difficulty in walking, not elsewhere classified: Secondary | ICD-10-CM | POA: Diagnosis not present

## 2017-11-16 DIAGNOSIS — M6281 Muscle weakness (generalized): Secondary | ICD-10-CM | POA: Diagnosis not present

## 2017-11-17 DIAGNOSIS — R262 Difficulty in walking, not elsewhere classified: Secondary | ICD-10-CM | POA: Diagnosis not present

## 2017-11-17 DIAGNOSIS — R2681 Unsteadiness on feet: Secondary | ICD-10-CM | POA: Diagnosis not present

## 2017-11-17 DIAGNOSIS — M6281 Muscle weakness (generalized): Secondary | ICD-10-CM | POA: Diagnosis not present

## 2017-11-19 DIAGNOSIS — R2681 Unsteadiness on feet: Secondary | ICD-10-CM | POA: Diagnosis not present

## 2017-11-19 DIAGNOSIS — R262 Difficulty in walking, not elsewhere classified: Secondary | ICD-10-CM | POA: Diagnosis not present

## 2017-11-19 DIAGNOSIS — M6281 Muscle weakness (generalized): Secondary | ICD-10-CM | POA: Diagnosis not present

## 2017-11-20 DIAGNOSIS — M6281 Muscle weakness (generalized): Secondary | ICD-10-CM | POA: Diagnosis not present

## 2017-11-20 DIAGNOSIS — R2681 Unsteadiness on feet: Secondary | ICD-10-CM | POA: Diagnosis not present

## 2017-11-20 DIAGNOSIS — R262 Difficulty in walking, not elsewhere classified: Secondary | ICD-10-CM | POA: Diagnosis not present

## 2017-11-23 DIAGNOSIS — R262 Difficulty in walking, not elsewhere classified: Secondary | ICD-10-CM | POA: Diagnosis not present

## 2017-11-23 DIAGNOSIS — R2681 Unsteadiness on feet: Secondary | ICD-10-CM | POA: Diagnosis not present

## 2017-11-23 DIAGNOSIS — M6281 Muscle weakness (generalized): Secondary | ICD-10-CM | POA: Diagnosis not present

## 2017-11-25 DIAGNOSIS — Z23 Encounter for immunization: Secondary | ICD-10-CM | POA: Diagnosis not present

## 2017-11-27 DIAGNOSIS — M6281 Muscle weakness (generalized): Secondary | ICD-10-CM | POA: Diagnosis not present

## 2017-11-27 DIAGNOSIS — R262 Difficulty in walking, not elsewhere classified: Secondary | ICD-10-CM | POA: Diagnosis not present

## 2017-11-27 DIAGNOSIS — R2681 Unsteadiness on feet: Secondary | ICD-10-CM | POA: Diagnosis not present

## 2017-12-14 DIAGNOSIS — R262 Difficulty in walking, not elsewhere classified: Secondary | ICD-10-CM | POA: Diagnosis not present

## 2017-12-14 DIAGNOSIS — M6281 Muscle weakness (generalized): Secondary | ICD-10-CM | POA: Diagnosis not present

## 2017-12-14 DIAGNOSIS — R2681 Unsteadiness on feet: Secondary | ICD-10-CM | POA: Diagnosis not present

## 2017-12-15 DIAGNOSIS — M1712 Unilateral primary osteoarthritis, left knee: Secondary | ICD-10-CM | POA: Diagnosis not present

## 2017-12-15 DIAGNOSIS — Z471 Aftercare following joint replacement surgery: Secondary | ICD-10-CM | POA: Diagnosis not present

## 2017-12-15 DIAGNOSIS — Z96641 Presence of right artificial hip joint: Secondary | ICD-10-CM | POA: Diagnosis not present

## 2018-01-01 ENCOUNTER — Inpatient Hospital Stay: Payer: Medicare Other | Attending: Gynecologic Oncology | Admitting: Gynecologic Oncology

## 2018-01-01 ENCOUNTER — Encounter: Payer: Self-pay | Admitting: Gynecologic Oncology

## 2018-01-01 VITALS — BP 158/82 | HR 76 | Temp 97.5°F | Resp 18 | Ht 63.0 in | Wt 181.0 lb

## 2018-01-01 DIAGNOSIS — Z90710 Acquired absence of both cervix and uterus: Secondary | ICD-10-CM

## 2018-01-01 DIAGNOSIS — Z923 Personal history of irradiation: Secondary | ICD-10-CM | POA: Diagnosis not present

## 2018-01-01 DIAGNOSIS — R350 Frequency of micturition: Secondary | ICD-10-CM | POA: Diagnosis not present

## 2018-01-01 DIAGNOSIS — C541 Malignant neoplasm of endometrium: Secondary | ICD-10-CM | POA: Insufficient documentation

## 2018-01-01 DIAGNOSIS — Z90722 Acquired absence of ovaries, bilateral: Secondary | ICD-10-CM | POA: Diagnosis not present

## 2018-01-01 NOTE — Patient Instructions (Signed)
Please notify Dr Denman George at phone number [phone] if you notice vaginal bleeding, new pelvic or abdominal pains, bloating, feeling full easy, or a change in bladder or bowel function.   Please return to see Dr Sondra Come as scheduled and Dr Denman George in June, 2020.

## 2018-01-01 NOTE — Progress Notes (Signed)
Follow-up Note: Gyn-Onc  Consult was requested by Dr. Garwin Brothers for the evaluation of Traci Richardson 84 y.o. female  CC:  Endometrial cancer follow-up Urinary frequency  Assessment/Plan:  Traci Richardson  is a 84 y.o.  year old with history of stage IB grade 1 endometriral adenocarcnoma.  High/intermediate risk factors for recurrence therefore she received vaginal brachytherapy for adjuvant treatment completed in September, 2018.    I recommend she continue to follow-up at 3 monthly intervals for symptom review, physical examination and pelvic examination. Pap smear is not recommended in routine endometrial cancer surveillance. After 2 years we will space these visits to every 6 months, and then annually if recurrence has not developed within 5 years. All questions were answered.  Follow-up with Dr Sondra Come in 3 months and myself in 6 months. After that we will transition to 6 monthly checks.   HPI: Traci Richardson is a 84 year old who was seen in consultation at the request of Dr Garwin Brothers for grade 1 endometrial cancer.  The patient has a history of postmenopausal bleeding for 3 months. She was seen by Dr Garwin Brothers on 06/24/16 and [place] was performed which showed a 1.9cm right ovarian complex cyst, a 107mm thickened endometrium with a polyp and a normal uterus.   She went to the OR 09/01/26 and a hysteroscopy and polypectomy was performed. Final pathology showed a grade 1 endometrial cancer with CAH.  She is 84 years of age but of excellent general health. She has had a lap chole and open umbilical hernia repair with mesh. She has had 2 vaginal deliveries. Her daughter has a history of endometrial cancer, but there is no other cancer history in the family.   She takes ASA 81mg  daily.  On 07/17/16 she underwent robotic hysterectomy, BSO and SLN biopsy. Surgery was uncomplicated. Final pathology revealed a grade 1 cancer, with 4cm maximum size. The myometrial invasion was 0.6 of 1cm. There was no  LVSI. A single left obturator SLN was negative. Due to high/intermediate risk factors noted in the endometrial tumor, vaginal brachytherapy adjuvant therapy was recommended in accordance with NCCN guidelines.  Interval Hx: She went on to receive vaginal brachytherapy. Treatment dates: 08/21/16-09/18/16: vaginal cuff brachytherapy to total 30 Gy in 5 fractions.  She has no complaints concerning for recurrent cancer.    Current Meds:  Outpatient Encounter Medications as of 01/01/2018  Medication Sig  . acetaminophen (TYLENOL) 500 MG tablet Take 1,000 mg by mouth 2 (two) times daily.  Marland Kitchen aspirin 81 MG chewable tablet Chew 1 tablet (81 mg total) by mouth 2 (two) times daily.  . busPIRone (BUSPAR) 5 MG tablet Take 5 mg by mouth daily after breakfast.  . docusate sodium (COLACE) 100 MG capsule Take 1 capsule (100 mg total) by mouth 2 (two) times daily.  Marland Kitchen ezetimibe (ZETIA) 10 MG tablet Take 10 mg by mouth daily after breakfast.  . HYDROcodone-acetaminophen (NORCO/VICODIN) 5-325 MG tablet Take 1-2 tablets by mouth every 6 (six) hours as needed for moderate pain (pain score 4-6). (Patient not taking: Reported on 10/05/2017)  . levothyroxine (SYNTHROID, LEVOTHROID) 100 MCG tablet Take 100 mcg by mouth daily before breakfast. On an empty stomach  . naproxen sodium (ANAPROX) 220 MG tablet Take 440 mg by mouth daily after breakfast.  . ondansetron (ZOFRAN) 4 MG tablet Take 1 tablet (4 mg total) by mouth every 6 (six) hours as needed for nausea. (Patient not taking: Reported on 10/05/2017)  . Polyethyl Glycol-Propyl Glycol (SYSTANE ULTRA) 0.4-0.3 %  SOLN Place 1-2 drops into both eyes daily as needed (for dry eyes).  Marland Kitchen senna (SENOKOT) 8.6 MG TABS tablet Take 1 tablet (8.6 mg total) by mouth 2 (two) times daily.  . simvastatin (ZOCOR) 20 MG tablet Take 20 mg by mouth daily after breakfast.  . vitamin B-12 (CYANOCOBALAMIN) 500 MCG tablet Take 500 mcg by mouth daily at 3 pm.   . Vitamin D, Ergocalciferol,  (DRISDOL) 50000 units CAPS capsule Take 50,000 Units by mouth 2 (two) times a week. Tuesday & Thursday after breakfast   No facility-administered encounter medications on file as of 01/01/2018.     Allergy:  Allergies  Allergen Reactions  . Sulfa Antibiotics Hives  . Codeine Other (See Comments)    Passes out  . Tramadol Other (See Comments)    Mood changes including confusion.  . Latex Itching and Rash    On hands w/latex gloves    Social Hx:   Social History   Socioeconomic History  . Marital status: Widowed    Spouse name: Not on file  . Number of children: 2  . Years of education: Not on file  . Highest education level: Not on file  Occupational History  . Not on file  Social Needs  . Financial resource strain: Not on file  . Food insecurity:    Worry: Not on file    Inability: Not on file  . Transportation needs:    Medical: Not on file    Non-medical: Not on file  Tobacco Use  . Smoking status: Never Smoker  . Smokeless tobacco: Never Used  Substance and Sexual Activity  . Alcohol use: No  . Drug use: No  . Sexual activity: Never  Lifestyle  . Physical activity:    Days per week: Not on file    Minutes per session: Not on file  . Stress: Not on file  Relationships  . Social connections:    Talks on phone: Not on file    Gets together: Not on file    Attends religious service: Not on file    Active member of club or organization: Not on file    Attends meetings of clubs or organizations: Not on file    Relationship status: Not on file  . Intimate partner violence:    Fear of current or ex partner: Not on file    Emotionally abused: Not on file    Physically abused: Not on file    Forced sexual activity: Not on file  Other Topics Concern  . Not on file  Social History Narrative  . Not on file    Past Surgical Hx:  Past Surgical History:  Procedure Laterality Date  . CATARACT EXTRACTION W/ INTRAOCULAR LENS  IMPLANT, BILATERAL  last 1990s  .  DILATATION & CURETTAGE/HYSTEROSCOPY WITH MYOSURE N/A 07/01/2016   Procedure: DILATATION & CURETTAGE/HYSTEROSCOPY WITH MYOSURE;  Surgeon: Servando Salina, MD;  Location: [facility];  Service: Gynecology;  Laterality: N/A;  . INCISIONAL HERNIA REPAIR  03-02-2001   dr d. Ninfa Linden @[facility]   . KNEE ARTHROSCOPY W/ MENISCAL REPAIR Left 2014 approx.  Marland Kitchen LAPAROSCOPIC CHOLECYSTECTOMY  early 55s  . ROBOTIC ASSISTED TOTAL HYSTERECTOMY WITH BILATERAL SALPINGO OOPHERECTOMY N/A 07/17/2016   Procedure: ROBOTIC ASSISTED TOTAL HYSTERECTOMY WITH BILATERAL SALPINGO OOPHORECTOMY AND LYSIS OF ADHESIONS;  Surgeon: Everitt Amber, MD;  Location: [facility];  Service: Gynecology;  Laterality: N/A;  . SENTINEL NODE BIOPSY N/A 07/17/2016   Procedure: SENTINEL NODE BIOPSY;  Surgeon: Everitt Amber, MD;  Location: [facility];  Service: Gynecology;  Laterality: N/A;  . TOTAL HIP ARTHROPLASTY Right 09/17/2017   Procedure: RIGHT TOTAL HIP ARTHROPLASTY ANTERIOR APPROACH;  Surgeon: Rod Can, MD;  Location: [facility];  Service: Orthopedics;  Laterality: Right;  Needs RNFA    Past Medical Hx:  Past Medical History:  Diagnosis Date  . Anxiety   . Arthritis    right hip,  shoulders, right knee  . DDD (degenerative disc disease), lumbosacral    multilevel  . Depression   . Endometrial carcinoma [facility]) oncologist-  dr Denman George-- per lov note in epic , no recurrence   dx 06/ 2018--  Stage IB, Grade 1--  s/p TAH w/ BSO, node dissection 07-17-2016 and completed radiation 09-18-2016  . History of colitis 03/2012  . History of radiation therapy 08/21/16, 08/08/16, 09/04/16, 09/11/16, 09/18/16   Bracytherapy HDR to the vaginal cuff 30 Gy in 5 fractions (endometrial cancer)  . Hyperlipidemia   . Hypothyroidism    endocrinologist--- dr Forde Dandy  . Nocturia   . Wears glasses   . Wears partial dentures    upper    Past Gynecological History:   No LMP recorded. Patient is postmenopausal.  Family Hx:  Family History  Problem Relation Age of Onset  .  Thyroid cancer Sister     Review of Systems:  Constitutional  Feels well,    ENT Normal appearing ears and nares bilaterally Skin/Breast  No rash, sores, jaundice, itching, dryness Cardiovascular  No chest pain, shortness of breath, or edema  Pulmonary  No cough or wheeze.  Gastro Intestinal  No nausea, vomitting, or diarrhoea. No bright red blood per rectum, no abdominal pain, change in bowel movement, or constipation.  Genito Urinary  + frequency, urgency, dysuria, no bleeding Musculo Skeletal  No myalgia, arthralgia, joint swelling or pain  Neurologic  No weakness, numbness, change in gait,  Psychology  No depression, anxiety, insomnia.   Vitals:  Blood pressure (!) 158/82, pulse 76, temperature (!) 97.5 F (36.4 C), temperature source Oral, resp. rate 18, height 5\' 3"  (1.6 m), weight 181 lb (82.1 kg), SpO2 97 %.  Physical Exam: WD in NAD Neck  Supple NROM, without any enlargements.  Lymph Node Survey No cervical supraclavicular or inguinal adenopathy Cardiovascular  Pulse normal rate, regularity and rhythm. S1 and S2 normal.  Lungs  Clear to auscultation bilateraly, without wheezes/crackles/rhonchi. Good air movement.  Skin  No rash/lesions/breakdown  Psychiatry  Alert and oriented to person, place, and time  Abdomen  Normoactive bowel sounds, abdomen soft, non-tender and obese without evidence of hernia. Incisions soft.  Back No CVA tenderness Genito Urinary  Normal vaginal cuff. No masses or lesions. Rectal  deferred  Extremities  No bilateral cyanosis, clubbing or edema.  Thereasa Solo, MD  01/01/2018, 4:03 PM

## 2018-01-11 DIAGNOSIS — M1712 Unilateral primary osteoarthritis, left knee: Secondary | ICD-10-CM | POA: Diagnosis not present

## 2018-01-11 DIAGNOSIS — Z96641 Presence of right artificial hip joint: Secondary | ICD-10-CM | POA: Diagnosis not present

## 2018-01-11 DIAGNOSIS — M7061 Trochanteric bursitis, right hip: Secondary | ICD-10-CM | POA: Diagnosis not present

## 2018-02-16 DIAGNOSIS — D6489 Other specified anemias: Secondary | ICD-10-CM | POA: Diagnosis not present

## 2018-02-16 DIAGNOSIS — E7849 Other hyperlipidemia: Secondary | ICD-10-CM | POA: Diagnosis not present

## 2018-02-16 DIAGNOSIS — Z6835 Body mass index (BMI) 35.0-35.9, adult: Secondary | ICD-10-CM | POA: Diagnosis not present

## 2018-02-16 DIAGNOSIS — E559 Vitamin D deficiency, unspecified: Secondary | ICD-10-CM | POA: Diagnosis not present

## 2018-02-16 DIAGNOSIS — E038 Other specified hypothyroidism: Secondary | ICD-10-CM | POA: Diagnosis not present

## 2018-02-16 DIAGNOSIS — D696 Thrombocytopenia, unspecified: Secondary | ICD-10-CM | POA: Diagnosis not present

## 2018-02-16 DIAGNOSIS — C541 Malignant neoplasm of endometrium: Secondary | ICD-10-CM | POA: Diagnosis not present

## 2018-02-16 DIAGNOSIS — M810 Age-related osteoporosis without current pathological fracture: Secondary | ICD-10-CM | POA: Diagnosis not present

## 2018-03-23 DIAGNOSIS — Z471 Aftercare following joint replacement surgery: Secondary | ICD-10-CM | POA: Diagnosis not present

## 2018-03-23 DIAGNOSIS — Z96641 Presence of right artificial hip joint: Secondary | ICD-10-CM | POA: Diagnosis not present

## 2018-04-05 ENCOUNTER — Ambulatory Visit: Payer: Medicare Other | Admitting: Radiation Oncology

## 2018-05-04 DIAGNOSIS — M1712 Unilateral primary osteoarthritis, left knee: Secondary | ICD-10-CM | POA: Diagnosis not present

## 2018-06-09 DIAGNOSIS — Z1159 Encounter for screening for other viral diseases: Secondary | ICD-10-CM | POA: Diagnosis not present

## 2018-07-02 ENCOUNTER — Inpatient Hospital Stay: Payer: Medicare Other | Attending: Gynecologic Oncology | Admitting: Gynecologic Oncology

## 2018-07-02 ENCOUNTER — Encounter: Payer: Self-pay | Admitting: Gynecologic Oncology

## 2018-07-02 ENCOUNTER — Other Ambulatory Visit: Payer: Self-pay

## 2018-07-02 VITALS — BP 143/86 | HR 79 | Temp 98.7°F | Resp 18 | Ht 63.0 in

## 2018-07-02 DIAGNOSIS — Z90722 Acquired absence of ovaries, bilateral: Secondary | ICD-10-CM | POA: Diagnosis not present

## 2018-07-02 DIAGNOSIS — Z90710 Acquired absence of both cervix and uterus: Secondary | ICD-10-CM | POA: Diagnosis not present

## 2018-07-02 DIAGNOSIS — C541 Malignant neoplasm of endometrium: Secondary | ICD-10-CM | POA: Insufficient documentation

## 2018-07-02 DIAGNOSIS — Z923 Personal history of irradiation: Secondary | ICD-10-CM | POA: Insufficient documentation

## 2018-07-02 DIAGNOSIS — R350 Frequency of micturition: Secondary | ICD-10-CM | POA: Insufficient documentation

## 2018-07-02 NOTE — Progress Notes (Signed)
Follow-up Note: Gyn-Onc  Consult was requested by Dr. Garwin Brothers for the evaluation of Traci Richardson 85 y.o. female  CC:  Endometrial cancer follow-up Urinary frequency  Assessment/Plan:  Traci Richardson  is a 85 y.o.  year old with history of stage IB grade 1 endometriral adenocarcnoma.  High/intermediate risk factors for recurrence therefore she received vaginal brachytherapy for adjuvant treatment completed in September, 2018.    I recommend she continue to follow-up at 3 monthly intervals for symptom review, physical examination and pelvic examination. Pap smear is not recommended in routine endometrial cancer surveillance. After 2 years we will space these visits to every 6 months, and then annually if recurrence has not developed within 5 years. All questions were answered.  Follow-up with Dr Sondra Come in 3 months and myself in 6 months. After that we will transition to 6 monthly checks.   HPI: Traci Richardson is a 85 year old who was seen in consultation at the request of Dr Garwin Brothers for grade 1 endometrial cancer.  The patient has a history of postmenopausal bleeding for 3 months. She was seen by Dr Garwin Brothers on 06/24/16 and [place] was performed which showed a 1.9cm right ovarian complex cyst, a 7mm thickened endometrium with a polyp and a normal uterus.   She went to the OR 09/01/26 and a hysteroscopy and polypectomy was performed. Final pathology showed a grade 1 endometrial cancer with CAH.  She is 85 years of age but of excellent general health. She has had a lap chole and open umbilical hernia repair with mesh. She has had 2 vaginal deliveries. Her daughter has a history of endometrial cancer, but there is no other cancer history in the family.   She takes ASA 81mg  daily.  On 07/17/16 she underwent robotic hysterectomy, BSO and SLN biopsy. Surgery was uncomplicated. Final pathology revealed a grade 1 cancer, with 4cm maximum size. The myometrial invasion was 0.6 of 1cm. There was no  LVSI. A single left obturator SLN was negative. Due to high/intermediate risk factors noted in the endometrial tumor, vaginal brachytherapy adjuvant therapy was recommended in accordance with NCCN guidelines.  Interval Hx: She went on to receive vaginal brachytherapy. Treatment dates: 08/21/16-09/18/16: vaginal cuff brachytherapy to total 30 Gy in 5 fractions.  She has no complaints concerning for recurrent cancer.    Today we discussed survivorship issues particularly related to side effects of radiation and weight gain and exercise and diet.   Current Meds:  Outpatient Encounter Medications as of 07/02/2018  Medication Sig  . acetaminophen (TYLENOL) 500 MG tablet Take 1,000 mg by mouth 2 (two) times daily.  Marland Kitchen aspirin 81 MG chewable tablet Chew 1 tablet (81 mg total) by mouth 2 (two) times daily.  . busPIRone (BUSPAR) 5 MG tablet Take 5 mg by mouth daily after breakfast.  . docusate sodium (COLACE) 100 MG capsule Take 1 capsule (100 mg total) by mouth 2 (two) times daily.  Marland Kitchen ezetimibe (ZETIA) 10 MG tablet Take 10 mg by mouth daily after breakfast.  . HYDROcodone-acetaminophen (NORCO/VICODIN) 5-325 MG tablet Take 1-2 tablets by mouth every 6 (six) hours as needed for moderate pain (pain score 4-6). (Patient not taking: Reported on 10/05/2017)  . levothyroxine (SYNTHROID, LEVOTHROID) 100 MCG tablet Take 100 mcg by mouth daily before breakfast. On an empty stomach  . naproxen sodium (ANAPROX) 220 MG tablet Take 440 mg by mouth daily after breakfast.  . ondansetron (ZOFRAN) 4 MG tablet Take 1 tablet (4 mg total) by mouth every  6 (six) hours as needed for nausea. (Patient not taking: Reported on 10/05/2017)  . Polyethyl Glycol-Propyl Glycol (SYSTANE ULTRA) 0.4-0.3 % SOLN Place 1-2 drops into both eyes daily as needed (for dry eyes).  Marland Kitchen senna (SENOKOT) 8.6 MG TABS tablet Take 1 tablet (8.6 mg total) by mouth 2 (two) times daily.  . simvastatin (ZOCOR) 20 MG tablet Take 20 mg by mouth daily after  breakfast.  . vitamin B-12 (CYANOCOBALAMIN) 500 MCG tablet Take 500 mcg by mouth daily at 3 pm.   . Vitamin D, Ergocalciferol, (DRISDOL) 50000 units CAPS capsule Take 50,000 Units by mouth 2 (two) times a week. Tuesday & Thursday after breakfast   No facility-administered encounter medications on file as of 07/02/2018.     Allergy:  Allergies  Allergen Reactions  . Sulfa Antibiotics Hives  . Codeine Other (See Comments)    Passes out  . Tramadol Other (See Comments)    Mood changes including confusion.  . Latex Itching and Rash    On hands w/latex gloves    Social Hx:   Social History   Socioeconomic History  . Marital status: Widowed    Spouse name: Not on file  . Number of children: 2  . Years of education: Not on file  . Highest education level: Not on file  Occupational History  . Not on file  Social Needs  . Financial resource strain: Not on file  . Food insecurity    Worry: Not on file    Inability: Not on file  . Transportation needs    Medical: Not on file    Non-medical: Not on file  Tobacco Use  . Smoking status: Never Smoker  . Smokeless tobacco: Never Used  Substance and Sexual Activity  . Alcohol use: No  . Drug use: No  . Sexual activity: Never  Lifestyle  . Physical activity    Days per week: Not on file    Minutes per session: Not on file  . Stress: Not on file  Relationships  . Social Herbalist on phone: Not on file    Gets together: Not on file    Attends religious service: Not on file    Active member of club or organization: Not on file    Attends meetings of clubs or organizations: Not on file    Relationship status: Not on file  . Intimate partner violence    Fear of current or ex partner: Not on file    Emotionally abused: Not on file    Physically abused: Not on file    Forced sexual activity: Not on file  Other Topics Concern  . Not on file  Social History Narrative  . Not on file    Past Surgical Hx:  Past  Surgical History:  Procedure Laterality Date  . CATARACT EXTRACTION W/ INTRAOCULAR LENS  IMPLANT, BILATERAL  last 1990s  . DILATATION & CURETTAGE/HYSTEROSCOPY WITH MYOSURE N/A 07/01/2016   Procedure: DILATATION & CURETTAGE/HYSTEROSCOPY WITH MYOSURE;  Surgeon: Servando Salina, MD;  Location: [facility];  Service: Gynecology;  Laterality: N/A;  . INCISIONAL HERNIA REPAIR  03-02-2001   dr d. Ninfa Linden @[facility]   . KNEE ARTHROSCOPY W/ MENISCAL REPAIR Left 2014 approx.  Marland Kitchen LAPAROSCOPIC CHOLECYSTECTOMY  early 51s  . ROBOTIC ASSISTED TOTAL HYSTERECTOMY WITH BILATERAL SALPINGO OOPHERECTOMY N/A 07/17/2016   Procedure: ROBOTIC ASSISTED TOTAL HYSTERECTOMY WITH BILATERAL SALPINGO OOPHORECTOMY AND LYSIS OF ADHESIONS;  Surgeon: Everitt Amber, MD;  Location: [facility];  Service: Gynecology;  Laterality:  N/A;  . SENTINEL NODE BIOPSY N/A 07/17/2016   Procedure: SENTINEL NODE BIOPSY;  Surgeon: Everitt Amber, MD;  Location: [facility];  Service: Gynecology;  Laterality: N/A;  . TOTAL HIP ARTHROPLASTY Right 09/17/2017   Procedure: RIGHT TOTAL HIP ARTHROPLASTY ANTERIOR APPROACH;  Surgeon: Rod Can, MD;  Location: [facility];  Service: Orthopedics;  Laterality: Right;  Needs RNFA    Past Medical Hx:  Past Medical History:  Diagnosis Date  . Anxiety   . Arthritis    right hip,  shoulders, right knee  . DDD (degenerative disc disease), lumbosacral    multilevel  . Depression   . Endometrial carcinoma [facility]) oncologist-  dr Denman George-- per lov note in epic , no recurrence   dx 06/ 2018--  Stage IB, Grade 1--  s/p TAH w/ BSO, node dissection 07-17-2016 and completed radiation 09-18-2016  . History of colitis 03/2012  . History of radiation therapy 08/21/16, 08/08/16, 09/04/16, 09/11/16, 09/18/16   Bracytherapy HDR to the vaginal cuff 30 Gy in 5 fractions (endometrial cancer)  . Hyperlipidemia   . Hypothyroidism    endocrinologist--- dr Forde Dandy  . Nocturia   . Wears glasses   . Wears partial dentures    upper    Past Gynecological  History:   No LMP recorded. Patient is postmenopausal.  Family Hx:  Family History  Problem Relation Age of Onset  . Thyroid cancer Sister     Review of Systems:  Constitutional  Feels well,    ENT Normal appearing ears and nares bilaterally Skin/Breast  No rash, sores, jaundice, itching, dryness Cardiovascular  No chest pain, shortness of breath, or edema  Pulmonary  No cough or wheeze.  Gastro Intestinal  No nausea, vomitting, or diarrhoea. No bright red blood per rectum, no abdominal pain, change in bowel movement, or constipation.  Genito Urinary  + frequency, urgency, dysuria, no bleeding Musculo Skeletal  No myalgia, arthralgia, joint swelling or pain  Neurologic  No weakness, numbness, change in gait,  Psychology  No depression, anxiety, insomnia.   Vitals:  Blood pressure (!) 143/86, pulse 79, temperature 98.7 F (37.1 C), temperature source Oral, resp. rate 18, height 5\' 3"  (1.6 m).  Physical Exam: WD in NAD Neck  Supple NROM, without any enlargements.  Lymph Node Survey No cervical supraclavicular or inguinal adenopathy Cardiovascular  Pulse normal rate, regularity and rhythm. S1 and S2 normal.  Lungs  Clear to auscultation bilateraly, without wheezes/crackles/rhonchi. Good air movement.  Skin  No rash/lesions/breakdown  Psychiatry  Alert and oriented to person, place, and time  Abdomen  Normoactive bowel sounds, abdomen soft, non-tender and obese without evidence of hernia. Incisions soft.  Back No CVA tenderness Genito Urinary  Normal vaginal cuff. No masses or lesions. Rectal  deferred  Extremities  No bilateral cyanosis, clubbing or edema.  Thereasa Solo, MD  07/02/2018, 4:06 PM

## 2018-07-02 NOTE — Patient Instructions (Signed)
Please notify Dr Denman George at phone number [phone] if you notice vaginal bleeding, new pelvic or abdominal pains, bloating, feeling full easy, or a change in bladder or bowel function.   If the viral numbers are low and you feel safe to venture to the [facility], please follow-up with Dr Sondra Come in September and Dr Denman George in December.  However, if you are concerned about the safety of this, and you have no concerning symptoms (such as those listed above), it is safe to wait until things have subsided before you come back.

## 2018-07-18 DIAGNOSIS — Z20828 Contact with and (suspected) exposure to other viral communicable diseases: Secondary | ICD-10-CM | POA: Diagnosis not present

## 2018-09-03 DIAGNOSIS — Z96641 Presence of right artificial hip joint: Secondary | ICD-10-CM | POA: Diagnosis not present

## 2018-09-03 DIAGNOSIS — M1712 Unilateral primary osteoarthritis, left knee: Secondary | ICD-10-CM | POA: Diagnosis not present

## 2018-09-03 DIAGNOSIS — Z471 Aftercare following joint replacement surgery: Secondary | ICD-10-CM | POA: Diagnosis not present

## 2018-09-16 DIAGNOSIS — E039 Hypothyroidism, unspecified: Secondary | ICD-10-CM | POA: Diagnosis not present

## 2018-09-16 DIAGNOSIS — R42 Dizziness and giddiness: Secondary | ICD-10-CM | POA: Diagnosis not present

## 2018-09-16 DIAGNOSIS — E785 Hyperlipidemia, unspecified: Secondary | ICD-10-CM | POA: Diagnosis not present

## 2018-09-16 DIAGNOSIS — S32009S Unspecified fracture of unspecified lumbar vertebra, sequela: Secondary | ICD-10-CM | POA: Diagnosis not present

## 2018-09-16 DIAGNOSIS — D696 Thrombocytopenia, unspecified: Secondary | ICD-10-CM | POA: Diagnosis not present

## 2018-09-16 DIAGNOSIS — E669 Obesity, unspecified: Secondary | ICD-10-CM | POA: Diagnosis not present

## 2018-09-16 DIAGNOSIS — M5416 Radiculopathy, lumbar region: Secondary | ICD-10-CM | POA: Diagnosis not present

## 2018-09-16 DIAGNOSIS — M810 Age-related osteoporosis without current pathological fracture: Secondary | ICD-10-CM | POA: Diagnosis not present

## 2018-09-16 DIAGNOSIS — D649 Anemia, unspecified: Secondary | ICD-10-CM | POA: Diagnosis not present

## 2018-09-16 DIAGNOSIS — Z0000 Encounter for general adult medical examination without abnormal findings: Secondary | ICD-10-CM | POA: Diagnosis not present

## 2018-09-16 DIAGNOSIS — C541 Malignant neoplasm of endometrium: Secondary | ICD-10-CM | POA: Diagnosis not present

## 2018-09-16 DIAGNOSIS — E559 Vitamin D deficiency, unspecified: Secondary | ICD-10-CM | POA: Diagnosis not present

## 2018-10-05 DIAGNOSIS — Z20828 Contact with and (suspected) exposure to other viral communicable diseases: Secondary | ICD-10-CM | POA: Diagnosis not present

## 2018-10-05 DIAGNOSIS — Z23 Encounter for immunization: Secondary | ICD-10-CM | POA: Diagnosis not present

## 2020-01-10 DIAGNOSIS — Z1159 Encounter for screening for other viral diseases: Secondary | ICD-10-CM | POA: Diagnosis not present

## 2020-01-10 DIAGNOSIS — Z20828 Contact with and (suspected) exposure to other viral communicable diseases: Secondary | ICD-10-CM | POA: Diagnosis not present

## 2020-01-13 DIAGNOSIS — Z1159 Encounter for screening for other viral diseases: Secondary | ICD-10-CM | POA: Diagnosis not present

## 2020-01-13 DIAGNOSIS — Z20828 Contact with and (suspected) exposure to other viral communicable diseases: Secondary | ICD-10-CM | POA: Diagnosis not present

## 2020-01-19 DIAGNOSIS — Z20828 Contact with and (suspected) exposure to other viral communicable diseases: Secondary | ICD-10-CM | POA: Diagnosis not present

## 2020-01-30 DIAGNOSIS — D649 Anemia, unspecified: Secondary | ICD-10-CM | POA: Diagnosis not present

## 2020-01-30 DIAGNOSIS — Z20828 Contact with and (suspected) exposure to other viral communicable diseases: Secondary | ICD-10-CM | POA: Diagnosis not present

## 2020-01-30 DIAGNOSIS — C541 Malignant neoplasm of endometrium: Secondary | ICD-10-CM | POA: Diagnosis not present

## 2020-01-30 DIAGNOSIS — E669 Obesity, unspecified: Secondary | ICD-10-CM | POA: Diagnosis not present

## 2020-01-30 DIAGNOSIS — E785 Hyperlipidemia, unspecified: Secondary | ICD-10-CM | POA: Diagnosis not present

## 2020-01-30 DIAGNOSIS — I700 Atherosclerosis of aorta: Secondary | ICD-10-CM | POA: Diagnosis not present

## 2020-01-30 DIAGNOSIS — M5416 Radiculopathy, lumbar region: Secondary | ICD-10-CM | POA: Diagnosis not present

## 2020-01-30 DIAGNOSIS — D696 Thrombocytopenia, unspecified: Secondary | ICD-10-CM | POA: Diagnosis not present

## 2020-01-30 DIAGNOSIS — E042 Nontoxic multinodular goiter: Secondary | ICD-10-CM | POA: Diagnosis not present

## 2020-01-30 DIAGNOSIS — M810 Age-related osteoporosis without current pathological fracture: Secondary | ICD-10-CM | POA: Diagnosis not present

## 2020-02-02 DIAGNOSIS — Z20828 Contact with and (suspected) exposure to other viral communicable diseases: Secondary | ICD-10-CM | POA: Diagnosis not present

## 2020-02-06 DIAGNOSIS — Z20828 Contact with and (suspected) exposure to other viral communicable diseases: Secondary | ICD-10-CM | POA: Diagnosis not present

## 2020-02-09 DIAGNOSIS — Z20828 Contact with and (suspected) exposure to other viral communicable diseases: Secondary | ICD-10-CM | POA: Diagnosis not present

## 2020-02-13 DIAGNOSIS — Z20828 Contact with and (suspected) exposure to other viral communicable diseases: Secondary | ICD-10-CM | POA: Diagnosis not present

## 2020-02-15 ENCOUNTER — Emergency Department (HOSPITAL_COMMUNITY): Payer: Medicare Other

## 2020-02-15 ENCOUNTER — Encounter (HOSPITAL_COMMUNITY): Payer: Self-pay | Admitting: Emergency Medicine

## 2020-02-15 ENCOUNTER — Emergency Department (HOSPITAL_COMMUNITY)
Admission: EM | Admit: 2020-02-15 | Discharge: 2020-02-16 | Disposition: A | Discharge status: Home / Self Care | Payer: Medicare Other | Attending: Emergency Medicine | Admitting: Emergency Medicine

## 2020-02-15 DIAGNOSIS — R0902 Hypoxemia: Secondary | ICD-10-CM | POA: Diagnosis not present

## 2020-02-15 DIAGNOSIS — Z79899 Other long term (current) drug therapy: Secondary | ICD-10-CM | POA: Diagnosis not present

## 2020-02-15 DIAGNOSIS — R079 Chest pain, unspecified: Secondary | ICD-10-CM | POA: Diagnosis not present

## 2020-02-15 DIAGNOSIS — I10 Essential (primary) hypertension: Secondary | ICD-10-CM | POA: Diagnosis not present

## 2020-02-15 DIAGNOSIS — R0602 Shortness of breath: Secondary | ICD-10-CM | POA: Diagnosis not present

## 2020-02-15 DIAGNOSIS — R030 Elevated blood-pressure reading, without diagnosis of hypertension: Secondary | ICD-10-CM | POA: Diagnosis not present

## 2020-02-15 DIAGNOSIS — I517 Cardiomegaly: Secondary | ICD-10-CM | POA: Diagnosis not present

## 2020-02-15 DIAGNOSIS — E039 Hypothyroidism, unspecified: Secondary | ICD-10-CM | POA: Insufficient documentation

## 2020-02-15 DIAGNOSIS — R0789 Other chest pain: Secondary | ICD-10-CM | POA: Insufficient documentation

## 2020-02-15 DIAGNOSIS — Z8542 Personal history of malignant neoplasm of other parts of uterus: Secondary | ICD-10-CM | POA: Insufficient documentation

## 2020-02-15 DIAGNOSIS — R072 Precordial pain: Secondary | ICD-10-CM | POA: Diagnosis not present

## 2020-02-15 LAB — CBC
HCT: 37.6 % (ref 36.0–46.0)
Hemoglobin: 12.3 g/dL (ref 12.0–15.0)
MCH: 30.2 pg (ref 26.0–34.0)
MCHC: 32.7 g/dL (ref 30.0–36.0)
MCV: 92.4 fL (ref 80.0–100.0)
Platelets: 180 10*3/uL (ref 150–400)
RBC: 4.07 MIL/uL (ref 3.87–5.11)
RDW: 14.4 % (ref 11.5–15.5)
WBC: 7.5 10*3/uL (ref 4.0–10.5)
nRBC: 0 % (ref 0.0–0.2)

## 2020-02-15 LAB — BASIC METABOLIC PANEL
Anion gap: 12 (ref 5–15)
BUN: 16 mg/dL (ref 8–23)
CO2: 24 mmol/L (ref 22–32)
Calcium: 9.1 mg/dL (ref 8.9–10.3)
Chloride: 105 mmol/L (ref 98–111)
Creatinine, Ser: 0.92 mg/dL (ref 0.44–1.00)
GFR, Estimated: 58 mL/min — ABNORMAL LOW (ref >60)
Glucose, Bld: 95 mg/dL (ref 70–99)
Potassium: 4 mmol/L (ref 3.5–5.1)
Sodium: 141 mmol/L (ref 135–145)

## 2020-02-15 LAB — TROPONIN I (HIGH SENSITIVITY)
Troponin I (High Sensitivity): 6 ng/L (ref <18)
Troponin I (High Sensitivity): 7 ng/L (ref <18)

## 2020-02-15 NOTE — ED Triage Notes (Signed)
Patient complains of chest pain and shortness of breath that started at approximately 1400 today. Patient alert and oriented and in no apparent distress at this time. Currently rates pain 6/10.

## 2020-02-15 NOTE — ED Notes (Signed)
Chest pain that started earlier today while playing bingo. Also reports shortness of breath that improved after removing protective mask.  18g left AC. 324mg  aspirin. 1x SL NTG.

## 2020-02-16 DIAGNOSIS — R0789 Other chest pain: Secondary | ICD-10-CM | POA: Diagnosis not present

## 2020-02-16 LAB — D-DIMER, QUANTITATIVE: D-Dimer, Quant: <0.27 ug/mL-FEU (ref 0.00–0.50)

## 2020-02-16 MED ORDER — NITROGLYCERIN 0.4 MG SL SUBL: 0.4000 mg | SUBLINGUAL_TABLET | SUBLINGUAL | 0 refills | Status: DC | PRN

## 2020-02-16 MED ORDER — ACETAMINOPHEN 325 MG PO TABS
650.0000 mg | ORAL_TABLET | Freq: Once | ORAL | Status: AC
  Administered 2020-02-16: 650 mg via ORAL
  Filled 2020-02-16: qty 2

## 2020-02-16 MED ORDER — NITROGLYCERIN 0.4 MG SL SUBL
0.4000 mg | SUBLINGUAL_TABLET | SUBLINGUAL | Status: DC | PRN
  Administered 2020-02-16: 0.4 mg via SUBLINGUAL
  Filled 2020-02-16: qty 1

## 2020-02-16 NOTE — Discharge Instructions (Signed)
Your EKG and heart enzymes were normal today.  However, I am still concerned that you may be having some heart problems.  Please follow-up with the cardiology group for further outpatient evaluation.  In the meantime, take aspirin 81 mg every day.  If your pain comes back, put one of the nitroglycerin tablets under your tongue.  You may repeat this for up to 3 doses, 5 minutes apart.  If you ever have pain that is not relieved completely by 3 doses of nitroglycerin, call 911 and come to the emergency department immediately.  Your blood pressure was a little high today.  Please have it rechecked at your facility.  If it stays high, you may need to be on medication to treat it.

## 2020-02-16 NOTE — ED Provider Notes (Signed)
[facility] Provider Note   CSN: [id number] Arrival date & time: 02/15/20  1812   History Chief Complaint  Patient presents with  . Chest Pain    Traci Richardson is a 87 y.o. female.  The history is provided by the patient.  Chest Pain She has history of hyperlipidemia and comes in because of chest pain which started about 2:30 PM.  Pain is described as a dull, heavy feeling in the midsternal area without radiation.  She rates it at 6/10.  It was worse when she was walking and worse when she lay flat.  Nothing seemed to make it any better.  There was some mild associated dyspnea, but no nausea or vomiting or diaphoresis.  Pain has been constant.  She came by ambulance and EMS did give her aspirin and nitroglycerin.  Nitroglycerin did give some temporary relief.  She has never had pain like this before.  She had is have a history of endometrial cancer in the remote past but no active cancer, no history of recent surgery or travel.  She is not receiving exogenous estrogens.  She denies history of hypertension, diabetes.  She is a non-smoker.  There is a family history of premature coronary atherosclerosis.  Past Medical History:  Diagnosis Date  . Anxiety   . Arthritis    right hip,  shoulders, right knee  . DDD (degenerative disc disease), lumbosacral    multilevel  . Depression   . Endometrial carcinoma [facility]) oncologist-  dr Denman George-- per lov note in epic , no recurrence   dx 06/ 2018--  Stage IB, Grade 1--  s/p TAH w/ BSO, node dissection 07-17-2016 and completed radiation 09-18-2016  . History of colitis 03/2012  . History of radiation therapy 08/21/16, 08/08/16, 09/04/16, 09/11/16, 09/18/16   Bracytherapy HDR to the vaginal cuff 30 Gy in 5 fractions (endometrial cancer)  . Hyperlipidemia   . Hypothyroidism    endocrinologist--- dr Forde Dandy  . Nocturia   . Wears glasses   . Wears partial dentures    upper    Patient Active Problem List   Diagnosis  Date Noted  . Osteoarthritis of right hip 09/17/2017  . Breast cancer screening 03/27/2017  . Endometrial cancer ([place]) 07/17/2016  . Endometrial adenocarcinoma ([place]) 07/11/2016  . Altered mental status 01/18/2013  . Fever 01/18/2013  . Lumbar compression fracture ([place]) 01/18/2013  . Fall 01/18/2013  . Influenza due to identified novel influenza A virus with other respiratory manifestations 01/18/2013  . Thrombocytopenia, unspecified ([place]) 03/21/2012    Class: Acute  . Colitis presumed infectious 03/18/2012  . Hypotension 03/18/2012  . Hypokalemia 03/18/2012  . Leukocytosis 03/18/2012  . Hypothyroid 03/18/2012  . Hyperlipidemia 03/18/2012  . Nonspecific (abnormal) findings on radiological and other examination of body structure 05/27/2007  . CT, CHEST, ABNORMAL 05/27/2007    Past Surgical History:  Procedure Laterality Date  . CATARACT EXTRACTION W/ INTRAOCULAR LENS  IMPLANT, BILATERAL  last 1990s  . DILATATION & CURETTAGE/HYSTEROSCOPY WITH MYOSURE N/A 07/01/2016   Procedure: DILATATION & CURETTAGE/HYSTEROSCOPY WITH MYOSURE;  Surgeon: Servando Salina, MD;  Location: [facility];  Service: Gynecology;  Laterality: N/A;  . INCISIONAL HERNIA REPAIR  03-02-2001   dr d. Ninfa Linden @[facility]   . KNEE ARTHROSCOPY W/ MENISCAL REPAIR Left 2014 approx.  Marland Kitchen LAPAROSCOPIC CHOLECYSTECTOMY  early 3s  . ROBOTIC ASSISTED TOTAL HYSTERECTOMY WITH BILATERAL SALPINGO OOPHERECTOMY N/A 07/17/2016   Procedure: ROBOTIC ASSISTED TOTAL HYSTERECTOMY WITH BILATERAL SALPINGO OOPHORECTOMY AND LYSIS OF ADHESIONS;  Surgeon: Everitt Amber, MD;  Location: [facility];  Service: Gynecology;  Laterality: N/A;  . SENTINEL NODE BIOPSY N/A 07/17/2016   Procedure: SENTINEL NODE BIOPSY;  Surgeon: Everitt Amber, MD;  Location: [facility];  Service: Gynecology;  Laterality: N/A;  . TOTAL HIP ARTHROPLASTY Right 09/17/2017   Procedure: RIGHT TOTAL HIP ARTHROPLASTY ANTERIOR APPROACH;  Surgeon: Rod Can, MD;  Location: [facility];  Service:  Orthopedics;  Laterality: Right;  Needs RNFA     OB History   No obstetric history on file.     Family History  Problem Relation Age of Onset  . Thyroid cancer Sister     Social History   Tobacco Use  . Smoking status: Never Smoker  . Smokeless tobacco: Never Used  Vaping Use  . Vaping Use: Never used  Substance Use Topics  . Alcohol use: No  . Drug use: No    Home Medications Prior to Admission medications   Medication Sig Start Date End Date Taking? Authorizing Provider  acetaminophen (TYLENOL) 500 MG tablet Take 1,000 mg by mouth 2 (two) times daily.    [provider]  aspirin 81 MG chewable tablet Chew 1 tablet (81 mg total) by mouth 2 (two) times daily. 09/18/17   Swinteck, Aaron Edelman, MD  busPIRone (BUSPAR) 5 MG tablet Take 5 mg by mouth daily after breakfast. 03/27/16   [provider]  docusate sodium (COLACE) 100 MG capsule Take 1 capsule (100 mg total) by mouth 2 (two) times daily. 09/18/17   Swinteck, Aaron Edelman, MD  ezetimibe (ZETIA) 10 MG tablet Take 10 mg by mouth daily after breakfast. 04/30/16   [provider]  HYDROcodone-acetaminophen (NORCO/VICODIN) 5-325 MG tablet Take 1-2 tablets by mouth every 6 (six) hours as needed for moderate pain (pain score 4-6). Patient not taking: Reported on 10/05/2017 09/18/17   Rod Can, MD  levothyroxine (SYNTHROID, LEVOTHROID) 100 MCG tablet Take 100 mcg by mouth daily before breakfast. On an empty stomach    [provider]  naproxen sodium (ANAPROX) 220 MG tablet Take 440 mg by mouth daily after breakfast.    [provider]  ondansetron (ZOFRAN) 4 MG tablet Take 1 tablet (4 mg total) by mouth every 6 (six) hours as needed for nausea. Patient not taking: Reported on 10/05/2017 09/18/17   Rod Can, MD  Polyethyl Glycol-Propyl Glycol (SYSTANE ULTRA) 0.4-0.3 % SOLN Place 1-2 drops into both eyes daily as needed (for dry eyes).    [provider]  senna (SENOKOT) 8.6 MG TABS  tablet Take 1 tablet (8.6 mg total) by mouth 2 (two) times daily. 09/18/17   Swinteck, Aaron Edelman, MD  simvastatin (ZOCOR) 20 MG tablet Take 20 mg by mouth daily after breakfast. 06/02/16   [provider]  vitamin B-12 (CYANOCOBALAMIN) 500 MCG tablet Take 500 mcg by mouth daily at 3 pm.     [provider]  Vitamin D, Ergocalciferol, (DRISDOL) 50000 units CAPS capsule Take 50,000 Units by mouth 2 (two) times a week. Tuesday & Thursday after breakfast 05/02/16   [provider]    Allergies    Sulfa antibiotics, Codeine, Tramadol, and Latex  Review of Systems   Review of Systems  Cardiovascular: Positive for chest pain.  All other systems reviewed and are negative.   Physical Exam Updated Vital Signs BP (!) 195/96   Pulse 69   Temp 98.1 F (36.7 C) (Oral)   Resp 16   SpO2 94%   Physical Exam Vitals and nursing note reviewed.  87 year old female, resting comfortably and in no acute distress. Vital signs are significant for elevated blood pressure. Oxygen saturation is 94%, which is normal. Head is normocephalic and atraumatic. PERRLA, EOMI. Oropharynx is clear. Neck is nontender and supple without adenopathy or JVD. Back is nontender and there is no CVA tenderness. Lungs are clear without rales, wheezes, or rhonchi. Chest is mildly tender in the parasternal area bilaterally.  This does not reproduce her pain. Heart has regular rate and rhythm without murmur. Abdomen is soft, flat, nontender without masses or hepatosplenomegaly and peristalsis is normoactive. Extremities have trace edema, full range of motion is present. Skin is warm and dry without rash. Neurologic: Mental status is normal, cranial nerves are intact, there are no motor or sensory deficits.   ED Results / Procedures / Treatments   Labs (all labs ordered are listed, but only abnormal results are displayed) Labs Reviewed  BASIC METABOLIC PANEL - Abnormal; Notable for the following  components:      Result Value   GFR, Estimated 58 (*)    All other components within normal limits  CBC  D-DIMER, QUANTITATIVE (NOT AT [facility])  TROPONIN I (HIGH SENSITIVITY)  TROPONIN I (HIGH SENSITIVITY)    EKG EKG Interpretation  Date/Time:  Wednesday February 15 2020 18:16:17 EST Ventricular Rate:  69 PR Interval:  162 QRS Duration: 82 QT Interval:  436 QTC Calculation: 467 R Axis:   45 Text Interpretation: Normal sinus rhythm Normal ECG When compared with ECG of 06/30/2016, No significant change was found Confirmed by Delora Fuel ([id number]) on 02/15/2020 11:08:44 PM   Radiology DG Chest 2 View  Result Date: 02/15/2020 CLINICAL DATA:  Chest pain EXAM: CHEST - 2 VIEW COMPARISON:  Chest radiograph 01/18/2013, lumbar CT 04/13/2017 FINDINGS: Bandlike opacities in the lung bases likely reflect a combination of subsegmental atelectasis and scarring. No new consolidative process is seen. Stable cardiomediastinal contours with cardiomegaly and a calcified, tortuous aorta. Pulmonary vascularity is normally distributed. No convincing features of edema. No pneumothorax. No effusion. Age indeterminate upper lumbar compression deformity, correlate with point tenderness. Progressive degenerative changes are present in the imaged spine and shoulders. Levocurvature of the thoracic spine is similar to priors. IMPRESSION: 1. Bandlike opacities in the lung bases likely reflect a combination of subsegmental atelectasis and scarring. No new consolidative process. 2. Cardiomegaly without convincing features of edema. 3. Age-indeterminate compression deformity of the upper lumbar spine, with some increased vertebral body height loss from comparison lumbar CT. Correlate with point tenderness and consider dedicated lumbar images. Electronically Signed   By: Lovena Le M.D.   On: 02/15/2020 18:58    Procedures Procedures   Medications Ordered in ED Medications  nitroGLYCERIN (NITROSTAT) SL tablet 0.4 mg (0.4 mg  Sublingual Given 02/16/20 0053)  acetaminophen (TYLENOL) tablet 650 mg (650 mg Oral Given 02/16/20 0050)    ED Course  I have reviewed the triage vital signs and the nursing notes.  Pertinent labs & imaging results that were available during my care of the patient were reviewed by me and considered in my medical decision making (see chart for details).  MDM Rules/Calculators/A&P Chest pain of uncertain cause.  ECG is normal and troponin is normal x2.  These make ACS very unlikely.  She does not have specific risk factors for pulmonary embolism.  Blood pressure is elevated which is something new for her.  Chest x-ray is shows cardiomegaly and vertebral compression but no acute process.  Old records are reviewed showing she completed  treatment of endometrial cancer in 2018.  Patient's heart pathway patient risk score is 5 which is an elevated risk but with normal troponins still appropriate for outpatient follow-up.  She had complete relief of pain with nitroglycerin.  D-dimer is normal.  Blood pressure has come down to almost normal.  She is discharged with an ambulatory referral to cardiology for outpatient evaluation.  She is advised to take aspirin 81 mg daily and is given a prescription for nitroglycerin.  Strict return precautions were given.  Final Clinical Impression(s) / ED Diagnoses Final diagnoses:  Nonspecific chest pain  Elevated blood pressure reading without diagnosis of hypertension    Rx / DC Orders ED Discharge Orders         Ordered    Ambulatory referral to Cardiology        02/16/20 0200    nitroGLYCERIN (NITROSTAT) 0.4 MG SL tablet  Every 5 min PRN        02/16/20 8416           Delora Fuel, MD 60/63/01 [phone]

## 2020-02-20 DIAGNOSIS — Z20828 Contact with and (suspected) exposure to other viral communicable diseases: Secondary | ICD-10-CM | POA: Diagnosis not present

## 2020-02-27 DIAGNOSIS — Z20828 Contact with and (suspected) exposure to other viral communicable diseases: Secondary | ICD-10-CM | POA: Diagnosis not present

## 2020-02-29 ENCOUNTER — Encounter: Payer: Self-pay | Admitting: Cardiology

## 2020-02-29 ENCOUNTER — Telehealth: Payer: Self-pay | Admitting: Cardiology

## 2020-02-29 ENCOUNTER — Ambulatory Visit (INDEPENDENT_AMBULATORY_CARE_PROVIDER_SITE_OTHER): Payer: Medicare Other | Admitting: Cardiology

## 2020-02-29 ENCOUNTER — Other Ambulatory Visit: Payer: Self-pay

## 2020-02-29 VITALS — BP 130/90 | HR 75 | Ht 63.0 in | Wt 194.0 lb

## 2020-02-29 DIAGNOSIS — H02831 Dermatochalasis of right upper eyelid: Secondary | ICD-10-CM | POA: Diagnosis not present

## 2020-02-29 DIAGNOSIS — Z961 Presence of intraocular lens: Secondary | ICD-10-CM | POA: Diagnosis not present

## 2020-02-29 DIAGNOSIS — R072 Precordial pain: Secondary | ICD-10-CM

## 2020-02-29 DIAGNOSIS — E782 Mixed hyperlipidemia: Secondary | ICD-10-CM

## 2020-02-29 DIAGNOSIS — H02834 Dermatochalasis of left upper eyelid: Secondary | ICD-10-CM | POA: Diagnosis not present

## 2020-02-29 DIAGNOSIS — H04123 Dry eye syndrome of bilateral lacrimal glands: Secondary | ICD-10-CM | POA: Diagnosis not present

## 2020-02-29 MED ORDER — METOPROLOL SUCCINATE ER 25 MG PO TB24: 25.0000 mg | ORAL_TABLET | Freq: Every day | ORAL | 3 refills | Status: DC

## 2020-02-29 NOTE — Progress Notes (Signed)
Primary Care Provider: Reynold Bowen, MD Cardiologist: No primary care provider on file. Electrophysiologist: None  Clinic Note: Chief Complaint  Patient presents with  . Hospitalization Follow-up    Follow-up ER visit for chest pain    ===================================  ASSESSMENT/PLAN   Problem List Items Addressed This Visit    Precordial pain - Primary    I spent a long time talking to her about her symptoms, her daughter/granddaughter seem to think that a lot of this discomfort is brought on by how she walks with her walker.  She bends forward and puts a lot of pressure on her chest and shoulders.  A lot of the discomfort is reproducible on exam making it more consistent with musculoskeletal symptoms.  We talked about stress test evaluation versus coronary CTA, but Cosima and her companion felt like these tests were probably unnecessary.  They did not want going to doing the studies and less the symptoms were worse or is not improving.  She was not overly concerned about the symptoms.  As such, we decided to treat as musculoskeletal pain and  Plan:  I will start Toprol 25 mg nightly given her current blood pressure -> mostly for possible antianginal benefit  continue to use the 325 mg aspirin that she has been using for pain to treat possible musculoskeletal pain.  Give as needed nitro -> she will take this pleasant 81 mg aspirin as needed for chest pain  Asked that she try to adjust how she uses her walker to avoid putting undue stress on her shoulders.  Follow-up in about 3 months to reassess her symptoms.  At that time, if symptoms are still present or getting worse, we would potentially pursue further ischemic evaluation.       Relevant Orders   EKG 12-Lead (Completed)   Hyperlipidemia (Chronic)    Her labs as of last March were outstanding on current dose of Zetia and simvastatin.      Relevant Medications   metoprolol succinate (TOPROL XL) 25 MG 24 hr  tablet     ===================================  HPI:    ABBYGALE LAPID is a 87 y.o. female with no prior cardiac history, but hyperlipidemia without hypertension or diabetes who is being seen today for the Post ER evaluation of CHEST PAIN at the request of Delora Fuel, MD.  CHARLENA HAUB was last seen on at [facility], ER on 02/15/2020 with complaint of chest pain that began about 2:30 in the evening.  Describes a dull heavy feeling in the sternal area.  Worse with walking and then with lying flat.  Nothing makes it better.  May be some relief with the aspirin and nitroglycerin. => Troponin x2 normal.  Plan outpatient follow-up.  Recent Hospitalizations: ER visit noted above  Reviewed  CV studies:    The following studies were reviewed today: (if available, images/films reviewed: From Epic Chart or Care Everywhere) . N/A   Interval History:   NATILEE GAUER is accompanied today by a young lady who brings her granddaughter.  She herself is not the greatest of historians.  She relates that the episode on ninth was associated with having walked quite a long way with her walker.  She felt a tightness in her chest and maybe some short of breath.  She said she is not really sure if there is the aspirin or the nitroglycerin that you feel better. She lives in [place] [facility].  She uses a walker, and according to  the only with her she really puts a lot of weight down on the walker and holds on very tight.  She is not very active and does not do a lot of walking except for meals etc.  Wondering why she is nervous because her left knee hurts.  Says she really does not do much, she will get short of breath if she overexerts meaning walking too far without stopping.  She still has chest discomfort off and on but nothing like she had when she went to emergency room.  Its described as a heaviness and is worse with doing certain movements as well as walking.  In fact sometimes lying  down makes it worse, sometimes sitting up makes it worse.  Moving arms in certain ways makes it worse.  She denies any heart failure symptoms of PND, orthopnea or edema.  No rapid irregular heartbeats palpitations.  No lightheadedness, dizziness or syncope/near syncope, no TIA or amaurosis fugax.  No claudication.   The patient does not have symptoms concerning for COVID-19 infection (fever, chills, cough, or new shortness of breath).   REVIEWED OF SYSTEMS   Review of Systems  Constitutional: Negative for malaise/fatigue (Just not very active) and weight loss.  HENT: Negative for nosebleeds.   Respiratory: Negative for cough and shortness of breath.   Gastrointestinal: Negative for blood in stool and melena.  Musculoskeletal: Positive for back pain and joint pain (Shoulders; left knee).  Neurological: Negative for dizziness and weakness.  Psychiatric/Behavioral: Positive for memory loss (Forgets little things here and there). The patient is not nervous/anxious and does not have insomnia.    I have reviewed and (if needed) personally updated the patient's problem list, medications, allergies, past medical and surgical history, social and family history.   PAST MEDICAL HISTORY   Past Medical History:  Diagnosis Date  . Anxiety   . Arthritis    right hip,  shoulders, right knee  . DDD (degenerative disc disease), lumbosacral    multilevel  . Depression   . Endometrial carcinoma [facility]) oncologist-  dr Denman George-- per lov note in epic , no recurrence   dx 06/ 2018--  Stage IB, Grade 1--  s/p TAH w/ BSO, node dissection 07-17-2016 and completed radiation 09-18-2016  . History of colitis 03/2012  . History of radiation therapy 08/21/16, 08/08/16, 09/04/16, 09/11/16, 09/18/16   Bracytherapy HDR to the vaginal cuff 30 Gy in 5 fractions (endometrial cancer)  . Hyperlipidemia   . Hypothyroidism    endocrinologist--- dr Forde Dandy  . Nocturia   . Wears glasses   . Wears partial dentures    upper     PAST SURGICAL HISTORY   Past Surgical History:  Procedure Laterality Date  . CATARACT EXTRACTION W/ INTRAOCULAR LENS  IMPLANT, BILATERAL  last 1990s  . DILATATION & CURETTAGE/HYSTEROSCOPY WITH MYOSURE N/A 07/01/2016   Procedure: DILATATION & CURETTAGE/HYSTEROSCOPY WITH MYOSURE;  Surgeon: Servando Salina, MD;  Location: [facility];  Service: Gynecology;  Laterality: N/A;  . INCISIONAL HERNIA REPAIR  03-02-2001   dr d. Ninfa Linden @[facility]   . KNEE ARTHROSCOPY W/ MENISCAL REPAIR Left 2014 approx.  Marland Kitchen LAPAROSCOPIC CHOLECYSTECTOMY  early 20s  . ROBOTIC ASSISTED TOTAL HYSTERECTOMY WITH BILATERAL SALPINGO OOPHERECTOMY N/A 07/17/2016   Procedure: ROBOTIC ASSISTED TOTAL HYSTERECTOMY WITH BILATERAL SALPINGO OOPHORECTOMY AND LYSIS OF ADHESIONS;  Surgeon: Everitt Amber, MD;  Location: [facility];  Service: Gynecology;  Laterality: N/A;  . SENTINEL NODE BIOPSY N/A 07/17/2016   Procedure: SENTINEL NODE BIOPSY;  Surgeon: Everitt Amber, MD;  Location:  [facility];  Service: Gynecology;  Laterality: N/A;  . TOTAL HIP ARTHROPLASTY Right 09/17/2017   Procedure: RIGHT TOTAL HIP ARTHROPLASTY ANTERIOR APPROACH;  Surgeon: Rod Can, MD;  Location: [facility];  Service: Orthopedics;  Laterality: Right;  Needs RNFA    Immunization History  Administered Date(s) Administered  . Td 04/16/2016    MEDICATIONS/ALLERGIES   Current Meds  Medication Sig  . acetaminophen (TYLENOL) 500 MG tablet Take 1,000 mg by mouth 2 (two) times daily.  Marland Kitchen aspirin 81 MG chewable tablet Chew 1 tablet (81 mg total) by mouth 2 (two) times daily.  . busPIRone (BUSPAR) 5 MG tablet Take 5 mg by mouth daily after breakfast.  . docusate sodium (COLACE) 100 MG capsule Take 1 capsule (100 mg total) by mouth 2 (two) times daily.  Marland Kitchen ezetimibe (ZETIA) 10 MG tablet Take 10 mg by mouth daily after breakfast.  . levothyroxine (SYNTHROID, LEVOTHROID) 100 MCG tablet Take 100 mcg by mouth daily before breakfast. On an empty stomach  . metoprolol succinate (TOPROL  XL) 25 MG 24 hr tablet Take 1 tablet (25 mg total) by mouth at bedtime.  . naproxen sodium (ANAPROX) 220 MG tablet Take 440 mg by mouth daily after breakfast.  . nitroGLYCERIN (NITROSTAT) 0.4 MG SL tablet Place 1 tablet (0.4 mg total) under the tongue every 5 (five) minutes as needed for chest pain.  Vladimir Faster Glycol-Propyl Glycol 0.4-0.3 % SOLN Place 1-2 drops into both eyes daily as needed (for dry eyes).  Marland Kitchen senna (SENOKOT) 8.6 MG TABS tablet Take 1 tablet (8.6 mg total) by mouth 2 (two) times daily.  . simvastatin (ZOCOR) 20 MG tablet Take 20 mg by mouth daily after breakfast.  . Vitamin D, Ergocalciferol, (DRISDOL) 50000 units CAPS capsule Take 50,000 Units by mouth 2 (two) times a week. Tuesday & Thursday after breakfast    Allergies  Allergen Reactions  . Sulfa Antibiotics Hives  . Codeine Other (See Comments)    Passes out  . Tramadol Other (See Comments)    Mood changes including confusion.  . Latex Itching and Rash    On hands w/latex gloves    SOCIAL HISTORY/FAMILY HISTORY   Reviewed in Epic:  Pertinent findings:  Social History   Tobacco Use  . Smoking status: Never Smoker  . Smokeless tobacco: Never Used  Vaping Use  . Vaping Use: Never used  Substance Use Topics  . Alcohol use: No  . Drug use: No   Social History   Social History Narrative  . Not on file    OBJCTIVE -PE, EKG, labs   Wt Readings from Last 3 Encounters:  02/29/20 194 lb (88 kg)  01/01/18 181 lb (82.1 kg)  10/05/17 193 lb 9.6 oz (87.8 kg)    Physical Exam: BP 130/90   Pulse 75   Ht 5\' 3"  (1.6 m)   Wt 194 lb (88 kg)   SpO2 98%   BMI 34.37 kg/m  Physical Exam Constitutional:      General: She is not in acute distress.    Appearance: Normal appearance. She is obese. She is not ill-appearing or toxic-appearing.     Comments: Relatively ill-appearing elderly woman.  Well-nourished, well-groomed.  HENT:     Head: Normocephalic and atraumatic.  Neck:     Vascular: No carotid  bruit.  Cardiovascular:     Rate and Rhythm: Normal rate and regular rhythm.     Pulses: Normal pulses.     Heart sounds: Murmur (Cannot exclude soft SEM at  RUSB) heard.  No friction rub. No gallop.   Pulmonary:     Effort: Pulmonary effort is normal. No respiratory distress.     Breath sounds: Normal breath sounds.  Chest:     Chest wall: No tenderness (Tenderness along the costosternal border on both sides.;  Also in the lateral pectoral muscles.).  Musculoskeletal:        General: No swelling. Normal range of motion.     Cervical back: Normal range of motion and neck supple.  Skin:    General: Skin is warm and dry.  Neurological:     General: No focal deficit present.     Mental Status: She is alert and oriented to person, place, and time.     Gait: Gait abnormal (Slow, steady gait, uses walker, placing lots of pressure in her arms or shoulders.).  Psychiatric:        Mood and Affect: Mood normal.        Behavior: Behavior normal.        Thought Content: Thought content normal.        Judgment: Judgment normal.     Comments: Some memory loss.      Adult ECG Report  Rate: 75 ;  Rhythm: normal sinus rhythm and Normal axis, intervals, durations.;   Narrative Interpretation: Normal  Recent Labs: Reviewed  03/26/2019: TC 135, TG 140, HDL 48, LDL 59. No results found for: CHOL, HDL, LDLCALC, LDLDIRECT, TRIG, CHOLHDL Lab Results  Component Value Date   CREATININE 0.92 02/15/2020   BUN 16 02/15/2020   NA 141 02/15/2020   K 4.0 02/15/2020   CL 105 02/15/2020   CO2 24 02/15/2020   CBC Latest Ref Rng & Units 02/15/2020 09/20/2017 09/19/2017  WBC 4.0 - 10.5 K/uL 7.5 6.9 8.2  Hemoglobin 12.0 - 15.0 g/dL 12.3 10.2(L) 10.2(L)  Hematocrit 36.0 - 46.0 % 37.6 30.7(L) 30.5(L)  Platelets 150 - 400 K/uL 180 128(L) 136(L)    Lab Results  Component Value Date   TSH 2.249 01/19/2013    ==================================================  COVID-19 Education: The signs and symptoms of  COVID-19 were discussed with the patient and how to seek care for testing (follow up with PCP or arrange E-visit).   The importance of social distancing and COVID-19 vaccination was discussed today. The patient is practicing social distancing & Masking.   I spent a total of 44minutes with the patient spent in direct patient consultation.  Additional time spent with chart review  / charting (studies, outside notes, etc): 10 min Total Time: 44 min   Current medicines are reviewed at length with the patient today.  (+/- concerns) none  This visit occurred during the SARS-CoV-2 public health emergency.  Safety protocols were in place, including screening questions prior to the visit, additional usage of staff PPE, and extensive cleaning of exam room while observing appropriate contact time as indicated for disinfecting solutions.  Notice: This dictation was prepared with Dragon dictation along with smaller phrase technology. Any transcriptional errors that result from this process are unintentional and may not be corrected upon review.  Patient Instructions / Medication Changes & Studies & Tests Ordered   Patient Instructions  Medication Instructions:  Start Toprol 25 mg at bedtime   continue taking  Asprin 325 mg daily   If you pain in chest  Take 81 mg aspirin and nitroglycerin tablet  If pain persist after taking 3 tables  5 min apart cal (911)  *If you need a refill on your cardiac  medications before your next appointment, please call your pharmacy*   Lab Work: Not needed    Testing/Procedures:  Not needed  Follow-Up: At [facility], you and your health needs are our priority.  As part of our continuing mission to provide you with exceptional heart care, we have created designated Provider Care Teams.  These Care Teams include your primary Cardiologist (physician) and Advanced Practice Providers (APPs -  Physician Assistants and Nurse Practitioners) who all work together to  provide you with the care you need, when you need it.     Your next appointment:   3 month(s)  The format for your next appointment:   In Person  Provider:   Glenetta Hew, MD   Other Instructions   try to watch how you place pressure on shoulders while using your walker to see if that cause you discomfort    Studies Ordered:   Orders Placed This Encounter  Procedures  . EKG 12-Lead     Glenetta Hew, M.D., M.S. Interventional Cardiologist   Pager # [phone] Phone # [phone] [address] [place], [place] [address]   Thank you for choosing Heartcare at [facility]!!

## 2020-02-29 NOTE — Telephone Encounter (Signed)
Called pt to advise fu appointment details for 05/24/20 w/[facility]. No answer/no machine. Mailed reminder.

## 2020-02-29 NOTE — Patient Instructions (Signed)
Medication Instructions:  Start Toprol 25 mg at bedtime   continue taking  Asprin 325 mg daily   If you pain in chest  Take 81 mg aspirin and nitroglycerin tablet  If pain persist after taking 3 tables  5 min apart cal (911)  *If you need a refill on your cardiac medications before your next appointment, please call your pharmacy*   Lab Work: Not needed    Testing/Procedures:  Not needed  Follow-Up: At [facility], you and your health needs are our priority.  As part of our continuing mission to provide you with exceptional heart care, we have created designated Provider Care Teams.  These Care Teams include your primary Cardiologist (physician) and Advanced Practice Providers (APPs -  Physician Assistants and Nurse Practitioners) who all work together to provide you with the care you need, when you need it.     Your next appointment:   3 month(s)  The format for your next appointment:   In Person  Provider:   Glenetta Hew, MD   Other Instructions   try to watch how you place pressure on shoulders while using your walker to see if that cause you discomfort

## 2020-03-01 DIAGNOSIS — Z20828 Contact with and (suspected) exposure to other viral communicable diseases: Secondary | ICD-10-CM | POA: Diagnosis not present

## 2020-03-05 DIAGNOSIS — Z20828 Contact with and (suspected) exposure to other viral communicable diseases: Secondary | ICD-10-CM | POA: Diagnosis not present

## 2020-03-08 DIAGNOSIS — Z20828 Contact with and (suspected) exposure to other viral communicable diseases: Secondary | ICD-10-CM | POA: Diagnosis not present

## 2020-03-12 DIAGNOSIS — M1712 Unilateral primary osteoarthritis, left knee: Secondary | ICD-10-CM | POA: Diagnosis not present

## 2020-03-12 DIAGNOSIS — Z20828 Contact with and (suspected) exposure to other viral communicable diseases: Secondary | ICD-10-CM | POA: Diagnosis not present

## 2020-03-19 DIAGNOSIS — Z20828 Contact with and (suspected) exposure to other viral communicable diseases: Secondary | ICD-10-CM | POA: Diagnosis not present

## 2020-03-20 DIAGNOSIS — M1712 Unilateral primary osteoarthritis, left knee: Secondary | ICD-10-CM | POA: Diagnosis not present

## 2020-03-25 ENCOUNTER — Encounter: Payer: Self-pay | Admitting: Cardiology

## 2020-03-25 NOTE — Assessment & Plan Note (Signed)
Her labs as of last March were outstanding on current dose of Zetia and simvastatin.

## 2020-03-25 NOTE — Assessment & Plan Note (Addendum)
I spent a long time talking to her about her symptoms, her daughter/granddaughter seem to think that a lot of this discomfort is brought on by how she walks with her walker.  She bends forward and puts a lot of pressure on her chest and shoulders.  A lot of the discomfort is reproducible on exam making it more consistent with musculoskeletal symptoms.  We talked about stress test evaluation versus coronary CTA, but Cieanna and her companion felt like these tests were probably unnecessary.  They did not want going to doing the studies and less the symptoms were worse or is not improving.  She was not overly concerned about the symptoms.  As such, we decided to treat as musculoskeletal pain and  Plan:  I will start Toprol 25 mg nightly given her current blood pressure -> mostly for possible antianginal benefit  continue to use the 325 mg aspirin that she has been using for pain to treat possible musculoskeletal pain.  Give as needed nitro -> she will take this pleasant 81 mg aspirin as needed for chest pain  Asked that she try to adjust how she uses her walker to avoid putting undue stress on her shoulders.  Follow-up in about 3 months to reassess her symptoms.  At that time, if symptoms are still present or getting worse, we would potentially pursue further ischemic evaluation.

## 2020-03-26 DIAGNOSIS — M1712 Unilateral primary osteoarthritis, left knee: Secondary | ICD-10-CM | POA: Diagnosis not present

## 2020-04-02 DIAGNOSIS — Z20828 Contact with and (suspected) exposure to other viral communicable diseases: Secondary | ICD-10-CM | POA: Diagnosis not present

## 2020-04-30 DIAGNOSIS — M1712 Unilateral primary osteoarthritis, left knee: Secondary | ICD-10-CM | POA: Diagnosis not present

## 2020-05-14 DIAGNOSIS — Z20828 Contact with and (suspected) exposure to other viral communicable diseases: Secondary | ICD-10-CM | POA: Diagnosis not present

## 2020-05-21 DIAGNOSIS — M25511 Pain in right shoulder: Secondary | ICD-10-CM | POA: Diagnosis not present

## 2020-05-21 DIAGNOSIS — M25512 Pain in left shoulder: Secondary | ICD-10-CM | POA: Diagnosis not present

## 2020-05-21 DIAGNOSIS — R6883 Chills (without fever): Secondary | ICD-10-CM | POA: Diagnosis not present

## 2020-05-21 DIAGNOSIS — R319 Hematuria, unspecified: Secondary | ICD-10-CM | POA: Diagnosis not present

## 2020-05-21 DIAGNOSIS — N390 Urinary tract infection, site not specified: Secondary | ICD-10-CM | POA: Diagnosis not present

## 2020-05-24 ENCOUNTER — Ambulatory Visit: Payer: Medicare Other | Admitting: Cardiology

## 2020-06-04 DIAGNOSIS — Z20828 Contact with and (suspected) exposure to other viral communicable diseases: Secondary | ICD-10-CM | POA: Diagnosis not present

## 2020-06-11 DIAGNOSIS — Z20828 Contact with and (suspected) exposure to other viral communicable diseases: Secondary | ICD-10-CM | POA: Diagnosis not present

## 2020-06-11 NOTE — Progress Notes (Signed)
Cardiology Clinic Note   Patient Name: Traci Richardson Date of Encounter: 06/12/2020  Primary Care Provider:  Reynold Bowen, MD Primary Cardiologist:  Glenetta Hew, MD  Patient Profile    Traci Richardson 87 year old female presents the clinic today for follow-up evaluation of her chest pain and hyperlipidemia.  Past Medical History    Past Medical History:  Diagnosis Date  . Anxiety   . Arthritis    right hip,  shoulders, right knee  . DDD (degenerative disc disease), lumbosacral    multilevel  . Depression   . Endometrial carcinoma [facility]) oncologist-  dr Denman George-- per lov note in epic , no recurrence   dx 06/ 2018--  Stage IB, Grade 1--  s/p TAH w/ BSO, node dissection 07-17-2016 and completed radiation 09-18-2016  . History of colitis 03/2012  . History of radiation therapy 08/21/16, 08/08/16, 09/04/16, 09/11/16, 09/18/16   Bracytherapy HDR to the vaginal cuff 30 Gy in 5 fractions (endometrial cancer)  . Hyperlipidemia   . Hypothyroidism    endocrinologist--- dr Forde Dandy  . Nocturia   . Wears glasses   . Wears partial dentures    upper   Past Surgical History:  Procedure Laterality Date  . CATARACT EXTRACTION W/ INTRAOCULAR LENS  IMPLANT, BILATERAL  last 1990s  . DILATATION & CURETTAGE/HYSTEROSCOPY WITH MYOSURE N/A 07/01/2016   Procedure: DILATATION & CURETTAGE/HYSTEROSCOPY WITH MYOSURE;  Surgeon: Servando Salina, MD;  Location: [facility];  Service: Gynecology;  Laterality: N/A;  . INCISIONAL HERNIA REPAIR  03-02-2001   dr d. Ninfa Linden @[facility]   . KNEE ARTHROSCOPY W/ MENISCAL REPAIR Left 2014 approx.  Marland Kitchen LAPAROSCOPIC CHOLECYSTECTOMY  early 1s  . ROBOTIC ASSISTED TOTAL HYSTERECTOMY WITH BILATERAL SALPINGO OOPHERECTOMY N/A 07/17/2016   Procedure: ROBOTIC ASSISTED TOTAL HYSTERECTOMY WITH BILATERAL SALPINGO OOPHORECTOMY AND LYSIS OF ADHESIONS;  Surgeon: Everitt Amber, MD;  Location: [facility];  Service: Gynecology;  Laterality: N/A;  . SENTINEL NODE BIOPSY N/A 07/17/2016   Procedure: SENTINEL  NODE BIOPSY;  Surgeon: Everitt Amber, MD;  Location: [facility];  Service: Gynecology;  Laterality: N/A;  . TOTAL HIP ARTHROPLASTY Right 09/17/2017   Procedure: RIGHT TOTAL HIP ARTHROPLASTY ANTERIOR APPROACH;  Surgeon: Rod Can, MD;  Location: [facility];  Service: Orthopedics;  Laterality: Right;  Needs RNFA    Allergies  Allergies  Allergen Reactions  . Sulfa Antibiotics Hives  . Codeine Other (See Comments)    Passes out  . Tramadol Other (See Comments)    Mood changes including confusion.  . Latex Itching and Rash    On hands w/latex gloves    History of Present Illness    Traci Richardson is a PMH of hypotension, colitis, hypothyroid, OA, lumbar compression fracture, hyperlipidemia, abnormal chest CT, hypokalemia, and precordial chest pain.  She was seen by Dr. Ellyn Hack on 02/29/2020.  During that time she was noted to have musculoskeletal type chest discomfort.  It was felt that her pain was caused by her position with her walker which put increased pressure on her chest and shoulders.  Stress test evaluation versus coronary CTA were discussed but felt to be unnecessary.  She was started on metoprolol 25 mg nightly, instructed to take 325 ASA for pain, and nitroglycerin and aspirin for chest pain.  Follow-up was planned for 3 months.  She presents the clinic today for follow-up evaluation states she feels well.  She continues to be very physically active.  Her daughter reports that she has the furthest room from the dining area.  She walks  her Bldg. 4 times per day.  She reports that she also uses a recumbent bike in the activity room.  She has been using this to times per week for about 5 minutes at a time.  We discussed the importance of her metoprolol medication and low-sodium diet.  I will continue her current medications and have her follow-up in 9 months.  Today she denies chest pain, shortness of breath, lower extremity edema, fatigue, palpitations, melena, hematuria, hemoptysis,  diaphoresis, weakness, presyncope, syncope, orthopnea, and PND.   Home Medications    Prior to Admission medications   Medication Sig Start Date End Date Taking? Authorizing Provider  acetaminophen (TYLENOL) 500 MG tablet Take 1,000 mg by mouth 2 (two) times daily.    [provider]  aspirin 81 MG chewable tablet Chew 1 tablet (81 mg total) by mouth 2 (two) times daily. 09/18/17   Swinteck, Aaron Edelman, MD  busPIRone (BUSPAR) 5 MG tablet Take 5 mg by mouth daily after breakfast. 03/27/16   [provider]  docusate sodium (COLACE) 100 MG capsule Take 1 capsule (100 mg total) by mouth 2 (two) times daily. 09/18/17   Swinteck, Aaron Edelman, MD  ezetimibe (ZETIA) 10 MG tablet Take 10 mg by mouth daily after breakfast. 04/30/16   [provider]  levothyroxine (SYNTHROID, LEVOTHROID) 100 MCG tablet Take 100 mcg by mouth daily before breakfast. On an empty stomach    [provider]  metoprolol succinate (TOPROL XL) 25 MG 24 hr tablet Take 1 tablet (25 mg total) by mouth at bedtime. 02/29/20   Leonie Man, MD  naproxen sodium (ANAPROX) 220 MG tablet Take 440 mg by mouth daily after breakfast.    [provider]  nitroGLYCERIN (NITROSTAT) 0.4 MG SL tablet Place 1 tablet (0.4 mg total) under the tongue every 5 (five) minutes as needed for chest pain. 0/09/38   Delora Fuel, MD  Polyethyl Glycol-Propyl Glycol 0.4-0.3 % SOLN Place 1-2 drops into Richardson eyes daily as needed (for dry eyes).    [provider]  senna (SENOKOT) 8.6 MG TABS tablet Take 1 tablet (8.6 mg total) by mouth 2 (two) times daily. 09/18/17   Swinteck, Aaron Edelman, MD  simvastatin (ZOCOR) 20 MG tablet Take 20 mg by mouth daily after breakfast. 06/02/16   [provider]  Vitamin D, Ergocalciferol, (DRISDOL) 50000 units CAPS capsule Take 50,000 Units by mouth 2 (two) times a week. Tuesday & Thursday after breakfast 05/02/16   [provider]    Family History    Family History   Problem Relation Age of Onset  . Thyroid cancer Sister   . Heart attack Father   . Stroke Brother    She indicated that her mother is deceased. She indicated that her father is deceased. She indicated that the status of her sister is unknown. She indicated that the status of her brother is unknown.  Social History    Social History   Socioeconomic History  . Marital status: Widowed    Spouse name: Not on file  . Number of children: 2  . Years of education: Not on file  . Highest education level: Not on file  Occupational History  . Not on file  Tobacco Use  . Smoking status: Never Smoker  . Smokeless tobacco: Never Used  Vaping Use  . Vaping Use: Never used  Substance and Sexual Activity  . Alcohol use: No  . Drug use: No  . Sexual activity: Never  Other Topics Concern  . Not  on file  Social History Narrative  . Not on file   Social Determinants of Health   Financial Resource Strain: Not on file  Food Insecurity: Not on file  Transportation Needs: Not on file  Physical Activity: Not on file  Stress: Not on file  Social Connections: Not on file  Intimate Partner Violence: Not on file     Review of Systems    General:  No chills, fever, night sweats or weight changes.  Cardiovascular:  No chest pain, dyspnea on exertion, edema, orthopnea, palpitations, paroxysmal nocturnal dyspnea. Dermatological: No rash, lesions/masses Respiratory: No cough, dyspnea Urologic: No hematuria, dysuria Abdominal:   No nausea, vomiting, diarrhea, bright red blood per rectum, melena, or hematemesis Neurologic:  No visual changes, wkns, changes in mental status. All other systems reviewed and are otherwise negative except as noted above.  Physical Exam    VS:  BP 127/82 (BP Location: Left Arm, Patient Position: Sitting, Cuff Size: Normal)   Pulse 76   Ht 5\' 2"  (1.575 m)   Wt 191 lb 9.6 oz (86.9 kg)   SpO2 97%   BMI 35.04 kg/m  , BMI Body mass index is 35.04 kg/m. GEN: Well  nourished, well developed, in no acute distress. HEENT: normal. Neck: Supple, no JVD, carotid bruits, or masses. Cardiac: RRR, no murmurs, rubs, or gallops. No clubbing, cyanosis, edema.  Radials/DP/PT 2+ and equal bilaterally.  Respiratory:  Respirations regular and unlabored, clear to auscultation bilaterally. GI: Soft, nontender, nondistended, BS + x 4. MS: no deformity or atrophy. Skin: warm and dry, no rash. Neuro:  Strength and sensation are intact. Psych: Normal affect.  Accessory Clinical Findings    Recent Labs: 02/15/2020: BUN 16; Creatinine, Ser 0.92; Hemoglobin 12.3; Platelets 180; Potassium 4.0; Sodium 141   Recent Lipid Panel No results found for: CHOL, TRIG, HDL, CHOLHDL, VLDL, LDLCALC, LDLDIRECT  ECG personally reviewed by me today-none today.  EKG 02/29/2020 Normal sinus rhythm 75 bpm Assessment & Plan   1.  Chest pain- resolved.  Previously seen and evaluated.  Pain felt to be musculoskeletal related to increased pressure on shoulders and chest with using walker. Continue metoprolol, aspirin, nitroglycerin Heart healthy low-sodium diet Increase physical activity as tolerated  Hyperlipidemia-LDL 59 on 03/28/19 Continue simvastatin, ezetimibe Heart healthy low-sodium high-fiber diet Increase physical activity as tolerated  Disposition: Follow-up with Dr. Ellyn Hack in 6-9 months.  Jossie Ng. Kennetha Pearman NP-C    06/12/2020, 3:29 PM [address] Office [phone] Fax [phone]  Notice: This dictation was prepared with Dragon dictation along with smaller phrase technology. Any transcriptional errors that result from this process are unintentional and may not be corrected upon review.  I spent 10 minutes examining this patient, reviewing medications, and using patient centered shared decision making involving her cardiac care.  Prior to her visit I spent greater than 20 minutes reviewing her past medical history,   medications, and prior cardiac tests.

## 2020-06-12 ENCOUNTER — Other Ambulatory Visit: Payer: Self-pay

## 2020-06-12 ENCOUNTER — Ambulatory Visit (INDEPENDENT_AMBULATORY_CARE_PROVIDER_SITE_OTHER): Payer: Medicare Other | Admitting: General Practice

## 2020-06-12 ENCOUNTER — Encounter: Payer: Self-pay | Admitting: General Practice

## 2020-06-12 VITALS — BP 127/82 | HR 76 | Ht 62.0 in | Wt 191.6 lb

## 2020-06-12 DIAGNOSIS — E782 Mixed hyperlipidemia: Secondary | ICD-10-CM | POA: Diagnosis not present

## 2020-06-12 DIAGNOSIS — R072 Precordial pain: Secondary | ICD-10-CM | POA: Diagnosis not present

## 2020-06-12 NOTE — Patient Instructions (Addendum)
Medication Instructions:  The current medical regimen is effective;  continue present plan and medications as directed. Please refer to the Current Medication list given to you today.  *If you need a refill on your cardiac medications before your next appointment, please call your pharmacy*  Lab Work:   Testing/Procedures:  NONE    NONE  Follow-Up: Your next appointment:  9 month(s) In Person with You may see Glenetta Hew, MD  IF UNAVAILABLE JESSE CLEAVER, FNP-C or one of the following Advanced Practice Providers on your designated Care Team:  Rosaria Ferries, PA-C  Jory Sims, DNP, ANP   Please call our office 2 months in advance to schedule this appointment   At [facility], you and your health needs are our priority.  As part of our continuing mission to provide you with exceptional heart care, we have created designated Provider Care Teams.  These Care Teams include your primary Cardiologist (physician) and Advanced Practice Providers (APPs -  Physician Assistants and Nurse Practitioners) who all work together to provide you with the care you need, when you need it.

## 2020-06-14 DIAGNOSIS — Z20828 Contact with and (suspected) exposure to other viral communicable diseases: Secondary | ICD-10-CM | POA: Diagnosis not present

## 2020-07-10 DIAGNOSIS — Z20828 Contact with and (suspected) exposure to other viral communicable diseases: Secondary | ICD-10-CM | POA: Diagnosis not present

## 2020-07-17 DIAGNOSIS — M1712 Unilateral primary osteoarthritis, left knee: Secondary | ICD-10-CM | POA: Diagnosis not present

## 2020-08-02 DIAGNOSIS — Z20828 Contact with and (suspected) exposure to other viral communicable diseases: Secondary | ICD-10-CM | POA: Diagnosis not present

## 2020-08-06 DIAGNOSIS — Z20828 Contact with and (suspected) exposure to other viral communicable diseases: Secondary | ICD-10-CM | POA: Diagnosis not present

## 2020-08-08 DIAGNOSIS — I700 Atherosclerosis of aorta: Secondary | ICD-10-CM | POA: Diagnosis not present

## 2020-08-08 DIAGNOSIS — D649 Anemia, unspecified: Secondary | ICD-10-CM | POA: Diagnosis not present

## 2020-08-08 DIAGNOSIS — E042 Nontoxic multinodular goiter: Secondary | ICD-10-CM | POA: Diagnosis not present

## 2020-08-08 DIAGNOSIS — E559 Vitamin D deficiency, unspecified: Secondary | ICD-10-CM | POA: Diagnosis not present

## 2020-08-08 DIAGNOSIS — R079 Chest pain, unspecified: Secondary | ICD-10-CM | POA: Diagnosis not present

## 2020-08-08 DIAGNOSIS — M810 Age-related osteoporosis without current pathological fracture: Secondary | ICD-10-CM | POA: Diagnosis not present

## 2020-08-08 DIAGNOSIS — M5416 Radiculopathy, lumbar region: Secondary | ICD-10-CM | POA: Diagnosis not present

## 2020-08-08 DIAGNOSIS — N1831 Chronic kidney disease, stage 3a: Secondary | ICD-10-CM | POA: Diagnosis not present

## 2020-08-08 DIAGNOSIS — D696 Thrombocytopenia, unspecified: Secondary | ICD-10-CM | POA: Diagnosis not present

## 2020-08-08 DIAGNOSIS — C541 Malignant neoplasm of endometrium: Secondary | ICD-10-CM | POA: Diagnosis not present

## 2020-08-08 DIAGNOSIS — E785 Hyperlipidemia, unspecified: Secondary | ICD-10-CM | POA: Diagnosis not present

## 2020-08-08 DIAGNOSIS — E669 Obesity, unspecified: Secondary | ICD-10-CM | POA: Diagnosis not present

## 2020-08-09 DIAGNOSIS — Z20828 Contact with and (suspected) exposure to other viral communicable diseases: Secondary | ICD-10-CM | POA: Diagnosis not present

## 2020-08-16 DIAGNOSIS — Z20828 Contact with and (suspected) exposure to other viral communicable diseases: Secondary | ICD-10-CM | POA: Diagnosis not present

## 2020-09-03 DIAGNOSIS — Z20828 Contact with and (suspected) exposure to other viral communicable diseases: Secondary | ICD-10-CM | POA: Diagnosis not present

## 2020-09-06 DIAGNOSIS — Z20828 Contact with and (suspected) exposure to other viral communicable diseases: Secondary | ICD-10-CM | POA: Diagnosis not present

## 2020-09-13 DIAGNOSIS — Z8616 Personal history of COVID-19: Secondary | ICD-10-CM | POA: Diagnosis not present

## 2020-09-20 DIAGNOSIS — Z20828 Contact with and (suspected) exposure to other viral communicable diseases: Secondary | ICD-10-CM | POA: Diagnosis not present

## 2020-09-22 DIAGNOSIS — I10 Essential (primary) hypertension: Secondary | ICD-10-CM | POA: Diagnosis not present

## 2020-09-22 DIAGNOSIS — R42 Dizziness and giddiness: Secondary | ICD-10-CM | POA: Diagnosis not present

## 2020-09-22 DIAGNOSIS — R5383 Other fatigue: Secondary | ICD-10-CM | POA: Diagnosis not present

## 2020-09-24 DIAGNOSIS — Z20828 Contact with and (suspected) exposure to other viral communicable diseases: Secondary | ICD-10-CM | POA: Diagnosis not present

## 2020-10-02 DIAGNOSIS — M1712 Unilateral primary osteoarthritis, left knee: Secondary | ICD-10-CM | POA: Diagnosis not present

## 2020-10-09 DIAGNOSIS — M1712 Unilateral primary osteoarthritis, left knee: Secondary | ICD-10-CM | POA: Diagnosis not present

## 2020-10-11 DIAGNOSIS — Z8616 Personal history of COVID-19: Secondary | ICD-10-CM | POA: Diagnosis not present

## 2020-10-16 DIAGNOSIS — M1712 Unilateral primary osteoarthritis, left knee: Secondary | ICD-10-CM | POA: Diagnosis not present

## 2020-10-18 DIAGNOSIS — Z8616 Personal history of COVID-19: Secondary | ICD-10-CM | POA: Diagnosis not present

## 2020-11-19 DIAGNOSIS — Z23 Encounter for immunization: Secondary | ICD-10-CM | POA: Diagnosis not present

## 2020-12-04 DIAGNOSIS — H9193 Unspecified hearing loss, bilateral: Secondary | ICD-10-CM | POA: Diagnosis not present

## 2020-12-04 DIAGNOSIS — H6121 Impacted cerumen, right ear: Secondary | ICD-10-CM | POA: Diagnosis not present

## 2020-12-13 ENCOUNTER — Other Ambulatory Visit: Payer: Self-pay

## 2020-12-13 ENCOUNTER — Emergency Department (HOSPITAL_COMMUNITY)
Admission: EM | Admit: 2020-12-13 | Discharge: 2020-12-13 | Disposition: A | Discharge status: Home / Self Care | Payer: Medicare Other | Attending: Emergency Medicine | Admitting: Emergency Medicine

## 2020-12-13 ENCOUNTER — Emergency Department (HOSPITAL_COMMUNITY): Payer: Medicare Other

## 2020-12-13 DIAGNOSIS — R0902 Hypoxemia: Secondary | ICD-10-CM | POA: Diagnosis not present

## 2020-12-13 DIAGNOSIS — R072 Precordial pain: Secondary | ICD-10-CM | POA: Diagnosis not present

## 2020-12-13 DIAGNOSIS — Z8542 Personal history of malignant neoplasm of other parts of uterus: Secondary | ICD-10-CM | POA: Insufficient documentation

## 2020-12-13 DIAGNOSIS — Z79899 Other long term (current) drug therapy: Secondary | ICD-10-CM | POA: Insufficient documentation

## 2020-12-13 DIAGNOSIS — N3000 Acute cystitis without hematuria: Secondary | ICD-10-CM | POA: Insufficient documentation

## 2020-12-13 DIAGNOSIS — Z91040 Latex allergy status: Secondary | ICD-10-CM | POA: Insufficient documentation

## 2020-12-13 DIAGNOSIS — G4489 Other headache syndrome: Secondary | ICD-10-CM | POA: Diagnosis not present

## 2020-12-13 DIAGNOSIS — Z96643 Presence of artificial hip joint, bilateral: Secondary | ICD-10-CM | POA: Insufficient documentation

## 2020-12-13 DIAGNOSIS — Z7982 Long term (current) use of aspirin: Secondary | ICD-10-CM | POA: Insufficient documentation

## 2020-12-13 DIAGNOSIS — R079 Chest pain, unspecified: Secondary | ICD-10-CM | POA: Diagnosis not present

## 2020-12-13 DIAGNOSIS — R0789 Other chest pain: Secondary | ICD-10-CM | POA: Diagnosis not present

## 2020-12-13 DIAGNOSIS — E039 Hypothyroidism, unspecified: Secondary | ICD-10-CM | POA: Insufficient documentation

## 2020-12-13 DIAGNOSIS — R531 Weakness: Secondary | ICD-10-CM | POA: Diagnosis not present

## 2020-12-13 DIAGNOSIS — I209 Angina pectoris, unspecified: Secondary | ICD-10-CM | POA: Diagnosis not present

## 2020-12-13 LAB — COMPREHENSIVE METABOLIC PANEL
ALT: 14 U/L (ref 0–44)
AST: 19 U/L (ref 15–41)
Albumin: 3.6 g/dL (ref 3.5–5.0)
Alkaline Phosphatase: 49 U/L (ref 38–126)
Anion gap: 8 (ref 5–15)
BUN: 20 mg/dL (ref 8–23)
CO2: 26 mmol/L (ref 22–32)
Calcium: 9.3 mg/dL (ref 8.9–10.3)
Chloride: 106 mmol/L (ref 98–111)
Creatinine, Ser: 0.97 mg/dL (ref 0.44–1.00)
GFR, Estimated: 54 mL/min — ABNORMAL LOW (ref >60)
Glucose, Bld: 90 mg/dL (ref 70–99)
Potassium: 4.2 mmol/L (ref 3.5–5.1)
Sodium: 140 mmol/L (ref 135–145)
Total Bilirubin: 0.6 mg/dL (ref 0.3–1.2)
Total Protein: 5.9 g/dL — ABNORMAL LOW (ref 6.5–8.1)

## 2020-12-13 LAB — CBC WITH DIFFERENTIAL/PLATELET
Abs Immature Granulocytes: 0.03 10*3/uL (ref 0.00–0.07)
Basophils Absolute: 0 10*3/uL (ref 0.0–0.1)
Basophils Relative: 1 %
Eosinophils Absolute: 0.2 10*3/uL (ref 0.0–0.5)
Eosinophils Relative: 3 %
HCT: 39.1 % (ref 36.0–46.0)
Hemoglobin: 12.5 g/dL (ref 12.0–15.0)
Immature Granulocytes: 0 %
Lymphocytes Relative: 18 %
Lymphs Abs: 1.5 10*3/uL (ref 0.7–4.0)
MCH: 30.5 pg (ref 26.0–34.0)
MCHC: 32 g/dL (ref 30.0–36.0)
MCV: 95.4 fL (ref 80.0–100.0)
Monocytes Absolute: 0.6 10*3/uL (ref 0.1–1.0)
Monocytes Relative: 7 %
Neutro Abs: 6.1 10*3/uL (ref 1.7–7.7)
Neutrophils Relative %: 71 %
Platelets: 180 10*3/uL (ref 150–400)
RBC: 4.1 MIL/uL (ref 3.87–5.11)
RDW: 14.2 % (ref 11.5–15.5)
WBC: 8.5 10*3/uL (ref 4.0–10.5)
nRBC: 0 % (ref 0.0–0.2)

## 2020-12-13 LAB — TROPONIN I (HIGH SENSITIVITY)
Troponin I (High Sensitivity): 6 ng/L (ref <18)
Troponin I (High Sensitivity): 8 ng/L (ref <18)

## 2020-12-13 LAB — URINALYSIS, MICROSCOPIC (REFLEX)

## 2020-12-13 LAB — URINALYSIS, ROUTINE W REFLEX MICROSCOPIC
Bilirubin Urine: NEGATIVE
Glucose, UA: NEGATIVE mg/dL
Hgb urine dipstick: NEGATIVE
Ketones, ur: NEGATIVE mg/dL
Leukocytes,Ua: NEGATIVE
Nitrite: POSITIVE — AB
Protein, ur: NEGATIVE mg/dL
Specific Gravity, Urine: 1.02 (ref 1.005–1.030)
pH: 6 (ref 5.0–8.0)

## 2020-12-13 MED ORDER — CEPHALEXIN 500 MG PO CAPS: 500.0000 mg | ORAL_CAPSULE | Freq: Four times a day (QID) | ORAL | 0 refills | Status: DC

## 2020-12-13 NOTE — ED Triage Notes (Signed)
Pt arrives from assisted living facility, [facility] for acute onset generalized CP that radiated to the left arm, throat and jaw, relieved with one nitro PTA. Pt reports that she has headache/fullness at this time. Pt family states that she appears pale.

## 2020-12-13 NOTE — ED Provider Notes (Signed)
[facility] EMERGENCY DEPARTMENT Provider Note   CSN: [id number] Arrival date & time: 12/13/20  1630     History Chief Complaint  Patient presents with   Chest Pain    Traci Richardson is a 87 y.o. female.  The history is provided by the patient, medical records and the EMS personnel.  Chest Pain Pain location:  Substernal area and L chest Pain quality: pressure   Pain radiates to:  L arm Pain severity:  Moderate Onset quality:  Gradual Duration:  2 hours Timing:  Constant Progression:  Unchanged Chronicity:  New Associated symptoms: diaphoresis   Associated symptoms: no abdominal pain, no back pain, no cough, no fever, no palpitations, no shortness of breath and no vomiting       Past Medical History:  Diagnosis Date   Anxiety    Arthritis    right hip,  shoulders, right knee   DDD (degenerative disc disease), lumbosacral    multilevel   Depression    Endometrial carcinoma [facility]) oncologist-  dr Denman George-- per lov note in epic , no recurrence   dx 06/ 2018--  Stage IB, Grade 1--  s/p TAH w/ BSO, node dissection 07-17-2016 and completed radiation 09-18-2016   History of colitis 03/2012   History of radiation therapy 08/21/16, 08/08/16, 09/04/16, 09/11/16, 09/18/16   Bracytherapy HDR to the vaginal cuff 30 Gy in 5 fractions (endometrial cancer)   Hyperlipidemia    Hypothyroidism    endocrinologist--- dr Forde Dandy   Nocturia    Wears glasses    Wears partial dentures    upper    Patient Active Problem List   Diagnosis Date Noted   Precordial pain 02/29/2020   Osteoarthritis of right hip 09/17/2017   Breast cancer screening 03/27/2017   Endometrial cancer ([place]) 07/17/2016   Endometrial adenocarcinoma ([place]) 07/11/2016   Altered mental status 01/18/2013   Fever 01/18/2013   Lumbar compression fracture ([place]) 01/18/2013   Fall 01/18/2013   Influenza due to identified novel influenza A virus with other respiratory manifestations 01/18/2013    Thrombocytopenia, unspecified ([place]) 03/21/2012    Class: Acute   Colitis presumed infectious 03/18/2012   Hypotension 03/18/2012   Hypokalemia 03/18/2012   Leukocytosis 03/18/2012   Hypothyroid 03/18/2012   Hyperlipidemia 03/18/2012   Nonspecific (abnormal) findings on radiological and other examination of body structure 05/27/2007   CT, CHEST, ABNORMAL 05/27/2007    Past Surgical History:  Procedure Laterality Date   CATARACT EXTRACTION W/ INTRAOCULAR LENS  IMPLANT, BILATERAL  last [place] N/A 07/01/2016   Procedure: DILATATION & CURETTAGE/HYSTEROSCOPY WITH MYOSURE;  Surgeon: Servando Salina, MD;  Location: [facility];  Service: Gynecology;  Laterality: N/A;   INCISIONAL HERNIA REPAIR  03-02-2001   dr d. Ninfa Linden @[facility]    KNEE ARTHROSCOPY W/ MENISCAL REPAIR Left 2014 approx.   LAPAROSCOPIC CHOLECYSTECTOMY  early 1990s   ROBOTIC ASSISTED TOTAL HYSTERECTOMY WITH BILATERAL SALPINGO OOPHERECTOMY N/A 07/17/2016   Procedure: ROBOTIC ASSISTED TOTAL HYSTERECTOMY WITH BILATERAL SALPINGO OOPHORECTOMY AND LYSIS OF ADHESIONS;  Surgeon: Everitt Amber, MD;  Location: [facility];  Service: Gynecology;  Laterality: N/A;   SENTINEL NODE BIOPSY N/A 07/17/2016   Procedure: SENTINEL NODE BIOPSY;  Surgeon: Everitt Amber, MD;  Location: [facility];  Service: Gynecology;  Laterality: N/A;   TOTAL HIP ARTHROPLASTY Right 09/17/2017   Procedure: RIGHT TOTAL HIP ARTHROPLASTY ANTERIOR APPROACH;  Surgeon: Rod Can, MD;  Location: [facility];  Service: Orthopedics;  Laterality: Right;  Needs RNFA  OB History   No obstetric history on file.     Family History  Problem Relation Age of Onset   Thyroid cancer Sister    Heart attack Father    Stroke Brother     Social History   Tobacco Use   Smoking status: Never   Smokeless tobacco: Never  Vaping Use   Vaping Use: Never used  Substance Use Topics   Alcohol use: No   Drug use: No    Home Medications Prior to  Admission medications   Medication Sig Start Date End Date Taking? Authorizing Provider  acetaminophen (TYLENOL) 500 MG tablet Take 1,000 mg by mouth 2 (two) times daily.   Yes [provider]  aspirin 81 MG chewable tablet Chew 1 tablet (81 mg total) by mouth 2 (two) times daily. 09/18/17  Yes Swinteck, Aaron Edelman, MD  busPIRone (BUSPAR) 5 MG tablet Take 5 mg by mouth daily after breakfast. 03/27/16  Yes [provider]  cephALEXin (KEFLEX) 500 MG capsule Take 1 capsule (500 mg total) by mouth 4 (four) times daily. 12/13/20  Yes Ming Mcmannis, Burnadette Peter, MD  docusate sodium (COLACE) 100 MG capsule Take 1 capsule (100 mg total) by mouth 2 (two) times daily. Patient taking differently: Take 100 mg by mouth daily as needed for mild constipation. 09/18/17  Yes Swinteck, Aaron Edelman, MD  ezetimibe (ZETIA) 10 MG tablet Take 10 mg by mouth daily after breakfast. 04/30/16  Yes [provider]  levothyroxine (SYNTHROID, LEVOTHROID) 100 MCG tablet Take 100 mcg by mouth daily before breakfast. On an empty stomach   Yes [provider]  metoprolol succinate (TOPROL XL) 25 MG 24 hr tablet Take 1 tablet (25 mg total) by mouth at bedtime. 02/29/20  Yes Leonie Man, MD  naproxen sodium (ANAPROX) 220 MG tablet Take 440 mg by mouth daily after breakfast.   Yes [provider]  nitroGLYCERIN (NITROSTAT) 0.4 MG SL tablet Place 1 tablet (0.4 mg total) under the tongue every 5 (five) minutes as needed for chest pain. 7/86/76  Yes Delora Fuel, MD  Polyethyl Glycol-Propyl Glycol 0.4-0.3 % SOLN Place 1-2 drops into both eyes daily as needed (for dry eyes).   Yes [provider]  senna (SENOKOT) 8.6 MG TABS tablet Take 1 tablet (8.6 mg total) by mouth 2 (two) times daily. 09/18/17  Yes Swinteck, Aaron Edelman, MD  simvastatin (ZOCOR) 20 MG tablet Take 20 mg by mouth daily after breakfast. 06/02/16  Yes [provider]  Vitamin D, Ergocalciferol, (DRISDOL) 50000 units CAPS capsule Take 50,000  Units by mouth 2 (two) times a week. Tuesday & Thursday after breakfast 05/02/16  Yes [provider]    Allergies    Sulfa antibiotics, Codeine, Tramadol, and Latex  Review of Systems   Review of Systems  Constitutional:  Positive for diaphoresis. Negative for chills and fever.  HENT:  Negative for ear pain and sore throat.   Eyes:  Negative for pain and visual disturbance.  Respiratory:  Negative for cough and shortness of breath.   Cardiovascular:  Positive for chest pain. Negative for palpitations.  Gastrointestinal:  Negative for abdominal pain and vomiting.  Genitourinary:  Negative for dysuria and hematuria.  Musculoskeletal:  Negative for arthralgias and back pain.  Skin:  Negative for color change and rash.  Neurological:  Negative for seizures and syncope.  All other systems reviewed and are negative.  Physical Exam Updated Vital Signs BP (!) 167/108   Pulse (!) 50   Temp 98.8 F (37.1 C) (  Oral)   Resp 17   Ht 5\' 2"  (1.575 m)   Wt 87.1 kg   SpO2 98%   BMI 35.12 kg/m   Physical Exam Vitals and nursing note reviewed.  Constitutional:      General: She is not in acute distress.    Appearance: Normal appearance. She is well-developed.  HENT:     Head: Normocephalic and atraumatic.     Right Ear: External ear normal.     Left Ear: External ear normal.     Nose: Nose normal. No congestion or rhinorrhea.     Mouth/Throat:     Mouth: Mucous membranes are moist.  Eyes:     Extraocular Movements: Extraocular movements intact.     Conjunctiva/sclera: Conjunctivae normal.     Pupils: Pupils are equal, round, and reactive to light.  Cardiovascular:     Rate and Rhythm: Normal rate and regular rhythm.     Pulses: Normal pulses.          Radial pulses are 2+ on the right side and 2+ on the left side.     Heart sounds: No murmur heard. Pulmonary:     Effort: Pulmonary effort is normal. No respiratory distress.     Breath sounds: Normal breath sounds. No  wheezing, rhonchi or rales.  Abdominal:     General: Abdomen is flat. Bowel sounds are normal.     Palpations: Abdomen is soft.     Tenderness: There is no abdominal tenderness. There is no guarding or rebound.  Musculoskeletal:        General: No swelling, tenderness or deformity.     Cervical back: Normal range of motion and neck supple. No rigidity.     Right lower leg: No tenderness. No edema.     Left lower leg: No tenderness. No edema.  Skin:    General: Skin is warm and dry.     Capillary Refill: Capillary refill takes less than 2 seconds.  Neurological:     General: No focal deficit present.     Mental Status: She is alert and oriented to person, place, and time.  Psychiatric:        Mood and Affect: Mood normal.    ED Results / Procedures / Treatments   Labs (all labs ordered are listed, but only abnormal results are displayed) Labs Reviewed  COMPREHENSIVE METABOLIC PANEL - Abnormal; Notable for the following components:      Result Value   Total Protein 5.9 (*)    GFR, Estimated 54 (*)    All other components within normal limits  URINALYSIS, ROUTINE W REFLEX MICROSCOPIC - Abnormal; Notable for the following components:   APPearance HAZY (*)    Nitrite POSITIVE (*)    All other components within normal limits  URINALYSIS, MICROSCOPIC (REFLEX) - Abnormal; Notable for the following components:   Bacteria, UA MANY (*)    All other components within normal limits  CBC WITH DIFFERENTIAL/PLATELET  TROPONIN I (HIGH SENSITIVITY)  TROPONIN I (HIGH SENSITIVITY)    EKG EKG Interpretation  Date/Time:  Thursday December 13 2020 16:34:00 EST Ventricular Rate:  52 PR Interval:  165 QRS Duration: 103 QT Interval:  473 QTC Calculation: 440 R Axis:   26 Text Interpretation: Sinus rhythm RSR' in V1 or V2, right VCD or RVH Borderline T abnormalities, inferior leads Artifact Abnormal ECG Confirmed by Carmin Muskrat [phone]) on 12/13/2020 4:53:17 PM  Radiology DG Chest  Portable 1 View  Result Date: 12/13/2020 CLINICAL DATA:  Reason  for exam: anginal chest pain anginal chest pain EXAM: PORTABLE CHEST 1 VIEW COMPARISON:  None. FINDINGS: Normal mediastinum and cardiac silhouette. Normal pulmonary vasculature. No evidence of effusion, infiltrate, or pneumothorax. No acute bony abnormality. IMPRESSION: No acute cardiopulmonary process. Electronically Signed   By: Suzy Bouchard M.D.   On: 12/13/2020 17:10    Procedures Procedures   Medications Ordered in ED Medications - No data to display  ED Course  I have reviewed the triage vital signs and the nursing notes.  Pertinent labs & imaging results that were available during my care of the patient were reviewed by me and considered in my medical decision making (see chart for details).    MDM Rules/Calculators/A&P                           87 year old female presenting with anginal chest pain.  She was given nitro in route with resolution of chest pain.  She is afebrile and hemodynamically stable.  EKG showed nonspecific T wave changes in the inferior leads.  Otherwise sinus rhythm.  No ST elevations or depressions.  Initial trop normal. Delta trop pending.  Chest x-ray showed no acute cardiopulmonary process.  She does not appear volume overloaded on exam. She has no history of heart failure.  She is not any acute respiratory distress.  She is not requiring oxygen.  Low concern for CHF exacerbation.  She is not describing any pleuritic chest pain.  She has no unilateral leg pain or swelling.  Low concern for PE.  Patient reassessed.  Resting comfortably.  She remains hemodynamically stable.  No recurrence of chest pain.  Delta troponin normal.  UA does show signs of UTI.  Discussed findings with patient.  She does have a cardiologist and would benefit from an updated stress test and echo.  We will encourage close follow-up early next week for cardiac testing.  Patient given a prescription for Keflex for UTI.   Strict return to ED precautions provided.   Final Clinical Impression(s) / ED Diagnoses Final diagnoses:  Nonspecific chest pain  Acute cystitis without hematuria    Rx / DC Orders ED Discharge Orders          Ordered    cephALEXin (KEFLEX) 500 MG capsule  4 times daily        12/13/20 2107             Idamae Lusher, MD 12/13/20 2110    Carmin Muskrat, MD 12/14/20 [phone]

## 2020-12-25 DIAGNOSIS — R300 Dysuria: Secondary | ICD-10-CM | POA: Diagnosis not present

## 2021-01-28 NOTE — Progress Notes (Signed)
Cardiology Office Note:    Date:  01/30/2021   ID:  Traci Richardson, DOB [birth year], MRN [id number]  PCP:  Reynold Bowen, MD   [facility] Providers Cardiologist:  Glenetta Hew, MD      Referring MD: Reynold Bowen, MD   Follow-up for chest pain and hyperlipidemia  History of Present Illness:    Traci Richardson is a 88 y.o. female with a hx of of hypotension, colitis, hypothyroid, OA, lumbar compression fracture, hyperlipidemia, abnormal chest CT, hypokalemia, and precordial chest pain.   She was seen by Dr. Ellyn Hack on 02/29/2020.  During that time she was noted to have musculoskeletal type chest discomfort.  It was felt that her pain was caused by her position with her walker which put increased pressure on her chest and shoulders.  Stress test evaluation versus coronary CTA were discussed but felt to be unnecessary.  She was started on metoprolol 25 mg nightly, instructed to take 325 ASA for pain, and nitroglycerin and aspirin for chest pain.  Follow-up was planned for 3 months.   She presented the clinic  06/12/2020 for follow-up evaluation stated she felt well.  She continued to be very physically active.  Her daughter reported that she had the furthest room from the dining area.  She walked her Bldg. 4 times per day.  She reported that she also used a recumbent bike in the activity room.  She had been the bike 2 times per week for about 5 minutes at a time.  We discussed the importance of her metoprolol medication and low-sodium diet.  I continued her current medications and planned follow-up in 9 months.   She presents to the clinic today for follow-up evaluation and states she feels well.  She did present to the emergency department 12/13/2020.  She reported an episode of chest discomfort.  She was diagnosed with urinary tract infection and treated with antibiotics.  She reports that she does notice shoulder pain when she has UTI.  She remains fairly physically active and has been  eating a heart healthy low-sodium diet.  We reviewed echocardiogram and stress testing.  We will defer further testing at this time due to no further episodes mild discomfort and well-controlled lipid panel.  I will continue her current medication regimen and plan follow-up for 6 months.  Today she denies chest pain, shortness of breath, lower extremity edema, fatigue, palpitations, melena, hematuria, hemoptysis, diaphoresis, weakness, presyncope, syncope, orthopnea, and PND.  Past Medical History:  Diagnosis Date   Anxiety    Arthritis    right hip,  shoulders, right knee   DDD (degenerative disc disease), lumbosacral    multilevel   Depression    Endometrial carcinoma [facility]) oncologist-  dr Denman George-- per lov note in epic , no recurrence   dx 06/ 2018--  Stage IB, Grade 1--  s/p TAH w/ BSO, node dissection 07-17-2016 and completed radiation 09-18-2016   History of colitis 03/2012   History of radiation therapy 08/21/16, 08/08/16, 09/04/16, 09/11/16, 09/18/16   Bracytherapy HDR to the vaginal cuff 30 Gy in 5 fractions (endometrial cancer)   Hyperlipidemia    Hypothyroidism    endocrinologist--- dr Forde Dandy   Nocturia    Wears glasses    Wears partial dentures    upper    Past Surgical History:  Procedure Laterality Date   CATARACT EXTRACTION W/ INTRAOCULAR LENS  IMPLANT, BILATERAL  last [place] N/A 07/01/2016   Procedure: DILATATION &  CURETTAGE/HYSTEROSCOPY WITH MYOSURE;  Surgeon: Servando Salina, MD;  Location: [facility];  Service: Gynecology;  Laterality: N/A;   INCISIONAL HERNIA REPAIR  03-02-2001   dr d. Ninfa Linden @[facility]    KNEE ARTHROSCOPY W/ MENISCAL REPAIR Left 2014 approx.   LAPAROSCOPIC CHOLECYSTECTOMY  early 1990s   ROBOTIC ASSISTED TOTAL HYSTERECTOMY WITH BILATERAL SALPINGO OOPHERECTOMY N/A 07/17/2016   Procedure: ROBOTIC ASSISTED TOTAL HYSTERECTOMY WITH BILATERAL SALPINGO OOPHORECTOMY AND LYSIS OF ADHESIONS;  Surgeon: Everitt Amber, MD;   Location: [facility];  Service: Gynecology;  Laterality: N/A;   SENTINEL NODE BIOPSY N/A 07/17/2016   Procedure: SENTINEL NODE BIOPSY;  Surgeon: Everitt Amber, MD;  Location: [facility];  Service: Gynecology;  Laterality: N/A;   TOTAL HIP ARTHROPLASTY Right 09/17/2017   Procedure: RIGHT TOTAL HIP ARTHROPLASTY ANTERIOR APPROACH;  Surgeon: Rod Can, MD;  Location: [facility];  Service: Orthopedics;  Laterality: Right;  Needs RNFA    Current Medications: Current Meds  Medication Sig   acetaminophen (TYLENOL) 500 MG tablet Take 1,000 mg by mouth 2 (two) times daily.   aspirin 81 MG chewable tablet Chew 81 mg by mouth daily.   busPIRone (BUSPAR) 5 MG tablet Take 5 mg by mouth daily after breakfast.   cephALEXin (KEFLEX) 500 MG capsule Take 1 capsule (500 mg total) by mouth 4 (four) times daily.   docusate sodium (COLACE) 100 MG capsule Take 1 capsule (100 mg total) by mouth 2 (two) times daily. (Patient taking differently: Take 100 mg by mouth daily as needed for mild constipation.)   ezetimibe (ZETIA) 10 MG tablet Take 10 mg by mouth daily after breakfast.   levothyroxine (SYNTHROID, LEVOTHROID) 100 MCG tablet Take 100 mcg by mouth daily before breakfast. On an empty stomach   metoprolol succinate (TOPROL XL) 25 MG 24 hr tablet Take 1 tablet (25 mg total) by mouth at bedtime.   naproxen sodium (ANAPROX) 220 MG tablet Take 440 mg by mouth daily after breakfast.   nitroGLYCERIN (NITROSTAT) 0.4 MG SL tablet Place 1 tablet (0.4 mg total) under the tongue every 5 (five) minutes as needed for chest pain.   Polyethyl Glycol-Propyl Glycol 0.4-0.3 % SOLN Place 1-2 drops into both eyes daily as needed (for dry eyes).   senna (SENOKOT) 8.6 MG TABS tablet Take 1 tablet (8.6 mg total) by mouth 2 (two) times daily.   simvastatin (ZOCOR) 20 MG tablet Take 20 mg by mouth daily after breakfast.   Vitamin D, Ergocalciferol, (DRISDOL) 50000 units CAPS capsule Take 50,000 Units by mouth 2 (two) times a week. Tuesday &  Thursday after breakfast     Allergies:   Sulfa antibiotics, Codeine, Tramadol, and Latex   Social History   Socioeconomic History   Marital status: Widowed    Spouse name: Not on file   Number of children: 2   Years of education: Not on file   Highest education level: Not on file  Occupational History   Not on file  Tobacco Use   Smoking status: Never   Smokeless tobacco: Never  Vaping Use   Vaping Use: Never used  Substance and Sexual Activity   Alcohol use: No   Drug use: No   Sexual activity: Never  Other Topics Concern   Not on file  Social History Narrative   Not on file   Social Determinants of Health   Financial Resource Strain: Not on file  Food Insecurity: Not on file  Transportation Needs: Not on file  Physical Activity: Not on file  Stress: Not on file  Social Connections: Not on file     Family History: The patient's family history includes Heart attack in her father; Stroke in her brother; Thyroid cancer in her sister.  ROS:   Please see the history of present illness.     All other systems reviewed and are negative.   Risk Assessment/Calculations:           Physical Exam:    VS:  BP 98/62    Pulse 87    Ht 5\' 2"  (1.575 m)    Wt 193 lb 8 oz (87.8 kg)    SpO2 97%    BMI 35.39 kg/m     Wt Readings from Last 3 Encounters:  01/30/21 193 lb 8 oz (87.8 kg)  2020/12/22 192 lb (87.1 kg)  06/12/20 191 lb 9.6 oz (86.9 kg)     GEN:  Well nourished, well developed in no acute distress HEENT: Normal NECK: No JVD; No carotid bruits LYMPHATICS: No lymphadenopathy CARDIAC: RRR, no murmurs, rubs, gallops RESPIRATORY:  Clear to auscultation without rales, wheezing or rhonchi  ABDOMEN: Soft, non-tender, non-distended MUSCULOSKELETAL:  No edema; No deformity  SKIN: Warm and dry NEUROLOGIC:  Alert and oriented x 3 PSYCHIATRIC:  Normal affect    EKGs/Labs/Other Studies Reviewed:    The following studies were reviewed today: EKG 12/22/20 Sinus  bradycardia 52 bpm  EKG: None today.  EKG 02/29/2020 Normal sinus rhythm 75 bpm  Recent Labs: December 22, 2020: ALT 14; BUN 20; Creatinine, Ser 0.97; Hemoglobin 12.5; Platelets 180; Potassium 4.2; Sodium 140  Recent Lipid Panel No results found for: CHOL, TRIG, HDL, CHOLHDL, VLDL, LDLCALC, LDLDIRECT  ASSESSMENT & PLAN    Chest pain-no further episodes.  Presented to the emergency department 12-22-20.  Troponins low and flat.  Lab work and chest x-ray unremarkable.  Continues to be fairly physically active.    Reviewed options of echocardiogram and stress testing.  Due to having no further episodes of chest discomfort since ED visit will defer ischemic evaluation at this time. Continue metoprolol, aspirin, nitroglycerin Heart healthy low-sodium diet-reviewed Maintain physical activity   Hyperlipidemia-LDL 67 on 08/08/20 Continue simvastatin, ezetimibe Heart healthy low-sodium high-fiber diet Increase physical activity as tolerated Follows with PCP  Disposition: Follow-up with Dr. Ellyn Hack or me in about 6 months.        Medication Adjustments/Labs and Tests Ordered: Current medicines are reviewed at length with the patient today.  Concerns regarding medicines are outlined above.  No orders of the defined types were placed in this encounter.  No orders of the defined types were placed in this encounter.   Patient Instructions  Medication Instructions:  Your Physician recommend you continue on your current medication as directed.    *If you need a refill on your cardiac medications before your next appointment, please call your pharmacy*   Lab Work: None ordered today   Testing/Procedures: None ordered today    Follow-Up: At [facility], you and your health needs are our priority.  As part of our continuing mission to provide you with exceptional heart care, we have created designated Provider Care Teams.  These Care Teams include your primary Cardiologist (physician) and  Advanced Practice Providers (APPs -  Physician Assistants and Nurse Practitioners) who all work together to provide you with the care you need, when you need it.  We recommend signing up for the patient portal called "MyChart".  Sign up information is provided on this After Visit Summary.  MyChart is used to connect with patients for Virtual  Visits (Telemedicine).  Patients are able to view lab/test results, encounter notes, upcoming appointments, etc.  Non-urgent messages can be sent to your provider as well.   To learn more about what you can do with MyChart, go to [email or web address].    Your next appointment:   6 month(s)  The format for your next appointment:   In Person  Provider:   Glenetta Hew, MD  or APP    Other Instructions Exercise recommendations: The American Heart Association recommends 150 minutes of moderate intensity exercise weekly. Try 30 minutes of moderate intensity exercise 4-5 times per week. This could include walking, jogging, or swimming.  Heart Healthy Diet Recommendations: A low-salt diet is recommended. Meats should be grilled, baked, or boiled. Avoid fried foods. Focus on lean protein sources like fish or chicken with vegetables and fruits. The American Heart Association is a Microbiologist!  American Heart Association Diet and Lifeystyle Recommendations      Signed, Deberah Pelton, NP  01/30/2021 4:29 PM      Notice: This dictation was prepared with Dragon dictation along with smaller phrase technology. Any transcriptional errors that result from this process are unintentional and may not be corrected upon review.  I spent 14 minutes examining this patient, reviewing medications, and using patient centered shared decision making involving her cardiac care.  Prior to her visit I spent greater than 20 minutes reviewing her past medical history,  medications, and prior cardiac tests.

## 2021-01-30 ENCOUNTER — Ambulatory Visit (HOSPITAL_BASED_OUTPATIENT_CLINIC_OR_DEPARTMENT_OTHER): Payer: Medicare Other | Admitting: General Practice

## 2021-01-30 ENCOUNTER — Other Ambulatory Visit: Payer: Self-pay

## 2021-01-30 ENCOUNTER — Encounter (HOSPITAL_BASED_OUTPATIENT_CLINIC_OR_DEPARTMENT_OTHER): Payer: Self-pay | Admitting: General Practice

## 2021-01-30 VITALS — BP 98/62 | HR 87 | Ht 62.0 in | Wt 193.5 lb

## 2021-01-30 DIAGNOSIS — E782 Mixed hyperlipidemia: Secondary | ICD-10-CM | POA: Diagnosis not present

## 2021-01-30 DIAGNOSIS — Z85828 Personal history of other malignant neoplasm of skin: Secondary | ICD-10-CM | POA: Diagnosis not present

## 2021-01-30 DIAGNOSIS — L821 Other seborrheic keratosis: Secondary | ICD-10-CM | POA: Diagnosis not present

## 2021-01-30 DIAGNOSIS — R072 Precordial pain: Secondary | ICD-10-CM | POA: Diagnosis not present

## 2021-01-30 DIAGNOSIS — D485 Neoplasm of uncertain behavior of skin: Secondary | ICD-10-CM | POA: Diagnosis not present

## 2021-01-30 DIAGNOSIS — L720 Epidermal cyst: Secondary | ICD-10-CM | POA: Diagnosis not present

## 2021-01-30 DIAGNOSIS — L814 Other melanin hyperpigmentation: Secondary | ICD-10-CM | POA: Diagnosis not present

## 2021-01-30 NOTE — Patient Instructions (Signed)
Medication Instructions:  Your Physician recommend you continue on your current medication as directed.    *If you need a refill on your cardiac medications before your next appointment, please call your pharmacy*   Lab Work: None ordered today   Testing/Procedures: None ordered today    Follow-Up: At [facility], you and your health needs are our priority.  As part of our continuing mission to provide you with exceptional heart care, we have created designated Provider Care Teams.  These Care Teams include your primary Cardiologist (physician) and Advanced Practice Providers (APPs -  Physician Assistants and Nurse Practitioners) who all work together to provide you with the care you need, when you need it.  We recommend signing up for the patient portal called "MyChart".  Sign up information is provided on this After Visit Summary.  MyChart is used to connect with patients for Virtual Visits (Telemedicine).  Patients are able to view lab/test results, encounter notes, upcoming appointments, etc.  Non-urgent messages can be sent to your provider as well.   To learn more about what you can do with MyChart, go to [email or web address].    Your next appointment:   6 month(s)  The format for your next appointment:   In Person  Provider:   Glenetta Hew, MD  or APP    Other Instructions Exercise recommendations: The American Heart Association recommends 150 minutes of moderate intensity exercise weekly. Try 30 minutes of moderate intensity exercise 4-5 times per week. This could include walking, jogging, or swimming.  Heart Healthy Diet Recommendations: A low-salt diet is recommended. Meats should be grilled, baked, or boiled. Avoid fried foods. Focus on lean protein sources like fish or chicken with vegetables and fruits. The American Heart Association is a Microbiologist!  American Heart Association Diet and Lifeystyle Recommendations

## 2021-02-08 DIAGNOSIS — M1712 Unilateral primary osteoarthritis, left knee: Secondary | ICD-10-CM | POA: Diagnosis not present

## 2021-02-11 DIAGNOSIS — N390 Urinary tract infection, site not specified: Secondary | ICD-10-CM | POA: Diagnosis not present

## 2021-02-13 DIAGNOSIS — M810 Age-related osteoporosis without current pathological fracture: Secondary | ICD-10-CM | POA: Diagnosis not present

## 2021-02-13 DIAGNOSIS — I700 Atherosclerosis of aorta: Secondary | ICD-10-CM | POA: Diagnosis not present

## 2021-02-13 DIAGNOSIS — E042 Nontoxic multinodular goiter: Secondary | ICD-10-CM | POA: Diagnosis not present

## 2021-02-13 DIAGNOSIS — R072 Precordial pain: Secondary | ICD-10-CM | POA: Diagnosis not present

## 2021-04-15 ENCOUNTER — Other Ambulatory Visit: Payer: Self-pay | Admitting: Cardiology

## 2021-04-30 DIAGNOSIS — R109 Unspecified abdominal pain: Secondary | ICD-10-CM | POA: Diagnosis not present

## 2021-05-05 DIAGNOSIS — N1831 Chronic kidney disease, stage 3a: Secondary | ICD-10-CM | POA: Diagnosis not present

## 2021-05-05 DIAGNOSIS — I700 Atherosclerosis of aorta: Secondary | ICD-10-CM | POA: Diagnosis not present

## 2021-05-05 DIAGNOSIS — E785 Hyperlipidemia, unspecified: Secondary | ICD-10-CM | POA: Diagnosis not present

## 2021-06-24 DIAGNOSIS — J0100 Acute maxillary sinusitis, unspecified: Secondary | ICD-10-CM | POA: Diagnosis not present

## 2021-06-28 DIAGNOSIS — R0981 Nasal congestion: Secondary | ICD-10-CM | POA: Diagnosis not present

## 2021-06-28 DIAGNOSIS — J029 Acute pharyngitis, unspecified: Secondary | ICD-10-CM | POA: Diagnosis not present

## 2021-06-28 DIAGNOSIS — R5383 Other fatigue: Secondary | ICD-10-CM | POA: Diagnosis not present

## 2021-06-28 DIAGNOSIS — J209 Acute bronchitis, unspecified: Secondary | ICD-10-CM | POA: Diagnosis not present

## 2021-07-11 DIAGNOSIS — M810 Age-related osteoporosis without current pathological fracture: Secondary | ICD-10-CM | POA: Diagnosis not present

## 2021-07-11 DIAGNOSIS — I700 Atherosclerosis of aorta: Secondary | ICD-10-CM | POA: Diagnosis not present

## 2021-07-11 DIAGNOSIS — R4189 Other symptoms and signs involving cognitive functions and awareness: Secondary | ICD-10-CM | POA: Diagnosis not present

## 2021-07-11 DIAGNOSIS — E042 Nontoxic multinodular goiter: Secondary | ICD-10-CM | POA: Diagnosis not present

## 2021-07-27 NOTE — Progress Notes (Unsigned)
Cardiology Office Note:    Date:  07/29/2021   ID:  TAMECIA MCDOUGALD, DOB 1925/02/17, MRN [id number]  PCP:  Reynold Bowen, MD   [facility] Providers Cardiologist:  Glenetta Hew, MD      Referring MD: Reynold Bowen, MD   Follow-up for chest pain and hyperlipidemia  History of Present Illness:    CELESE BANNER is a 88 y.o. female with a hx of of hypotension, colitis, hypothyroid, OA, lumbar compression fracture, hyperlipidemia, abnormal chest CT, hypokalemia, and precordial chest pain.   She was seen by Dr. Ellyn Hack on 02/29/2020.  During that time she was noted to have musculoskeletal type chest discomfort.  It was felt that her pain was caused by her position with her walker which put increased pressure on her chest and shoulders.  Stress test evaluation versus coronary CTA were discussed but felt to be unnecessary.  She was started on metoprolol 25 mg nightly, instructed to take 325 ASA for pain, and nitroglycerin and aspirin for chest pain.  Follow-up was planned for 3 months.   She presented the clinic  06/12/2020 for follow-up evaluation stated she felt well.  She continued to be very physically active.  Her daughter reported that she had the furthest room from the dining area.  She walked her Bldg. 4 times per day.  She reported that she also used a recumbent bike in the activity room.  She had been the bike 2 times per week for about 5 minutes at a time.  We discussed the importance of her metoprolol medication and low-sodium diet.  I continued her current medications and planned follow-up in 9 months.   She presented to the clinic 01/30/2021 for follow-up evaluation and stated she felt well.  She did present to the emergency department 12/13/2020.  She reported an episode of chest discomfort.  She was diagnosed with urinary tract infection and treated with antibiotics.  She reported that she did notice shoulder pain when she had UTI.  She remained fairly physically active and had been  eating a heart healthy low-sodium diet.  We reviewed echocardiogram and stress testing.  I defered further testing at that time due to no further episodes mild discomfort and well-controlled lipid panel.  I  continued her current medication regimen and planned follow-up for 6 months.  She presents to the clinic today for follow-up evaluation states she has moved home with her daughter.  She does not feel that she is as active as she was at the nursing center.  She does ambulate throughout the house and on the outside deck.  She reports occasional episodes of brief chest discomfort that happen both at rest and with physical activity.  These appear to be some related to use of her walker and musculoskeletal pain.  She also notices some episodes of dizziness when she is rushing around the house.  She has not used any nitroglycerin.  I will refill her sublingual nitroglycerin, have her increase her physical activity as tolerated, increase p.o. hydration, and plan follow-up for 6 months.  Today she denies chest pain, shortness of breath, lower extremity edema, fatigue, palpitations, melena, hematuria, hemoptysis, diaphoresis, weakness, presyncope, syncope, orthopnea, and PND.  Past Medical History:  Diagnosis Date   Anxiety    Arthritis    right hip,  shoulders, right knee   DDD (degenerative disc disease), lumbosacral    multilevel   Depression    Endometrial carcinoma [facility]) oncologist-  dr Denman George-- per lov note in epic ,  no recurrence   dx 06/ 2018--  Stage IB, Grade 1--  s/p TAH w/ BSO, node dissection 07-17-2016 and completed radiation 09-18-2016   History of colitis 03/2012   History of radiation therapy 08/21/16, 08/08/16, 09/04/16, 09/11/16, 09/18/16   Bracytherapy HDR to the vaginal cuff 30 Gy in 5 fractions (endometrial cancer)   Hyperlipidemia    Hypothyroidism    endocrinologist--- dr Forde Dandy   Nocturia    Wears glasses    Wears partial dentures    upper    Past Surgical History:  Procedure  Laterality Date   CATARACT EXTRACTION W/ INTRAOCULAR LENS  IMPLANT, BILATERAL  last [place] N/A 07/01/2016   Procedure: [place];  Surgeon: Servando Salina, MD;  Location: [facility];  Service: Gynecology;  Laterality: N/A;   INCISIONAL HERNIA REPAIR  03-02-2001   dr d. Ninfa Linden '@[facility]    KNEE ARTHROSCOPY W/ MENISCAL REPAIR Left 2014 approx.   LAPAROSCOPIC CHOLECYSTECTOMY  early 1990s   ROBOTIC ASSISTED TOTAL HYSTERECTOMY WITH BILATERAL SALPINGO OOPHERECTOMY N/A 07/17/2016   Procedure: ROBOTIC ASSISTED TOTAL HYSTERECTOMY WITH BILATERAL SALPINGO OOPHORECTOMY AND LYSIS OF ADHESIONS;  Surgeon: Everitt Amber, MD;  Location: [facility];  Service: Gynecology;  Laterality: N/A;   SENTINEL NODE BIOPSY N/A 07/17/2016   Procedure: SENTINEL NODE BIOPSY;  Surgeon: Everitt Amber, MD;  Location: [facility];  Service: Gynecology;  Laterality: N/A;   TOTAL HIP ARTHROPLASTY Right 09/17/2017   Procedure: RIGHT TOTAL HIP ARTHROPLASTY ANTERIOR APPROACH;  Surgeon: Rod Can, MD;  Location: [facility];  Service: Orthopedics;  Laterality: Right;  Needs RNFA    Current Medications: Current Meds  Medication Sig   acetaminophen (TYLENOL) 500 MG tablet Take 1,000 mg by mouth 2 (two) times daily.   aspirin 81 MG chewable tablet Chew 81 mg by mouth daily.   busPIRone (BUSPAR) 5 MG tablet Take 5 mg by mouth daily after breakfast.   docusate sodium (COLACE) 100 MG capsule Take 1 capsule (100 mg total) by mouth 2 (two) times daily. (Patient taking differently: Take 100 mg by mouth daily as needed for mild constipation.)   ezetimibe (ZETIA) 10 MG tablet Take 10 mg by mouth daily after breakfast.   levothyroxine (SYNTHROID, LEVOTHROID) 100 MCG tablet Take 100 mcg by mouth daily before breakfast. On an empty stomach   meclizine (ANTIVERT) 25 MG tablet TAKE ONE BY MOUTH THREE TIMES A DAY AS NEEDED   metoprolol succinate (TOPROL-XL) 25 MG 24 hr tablet TAKE  1 TABLET BY MOUTH EVERY NIGHT AT BEDTIME   naproxen sodium (ANAPROX) 220 MG tablet Take 440 mg by mouth daily after breakfast.   nitroGLYCERIN (NITROSTAT) 0.4 MG SL tablet Place 1 tablet (0.4 mg total) under the tongue every 5 (five) minutes as needed for chest pain.   Polyethyl Glycol-Propyl Glycol 0.4-0.3 % SOLN Place 1-2 drops into both eyes daily as needed (for dry eyes).   senna (SENOKOT) 8.6 MG TABS tablet Take 1 tablet (8.6 mg total) by mouth 2 (two) times daily.   simvastatin (ZOCOR) 20 MG tablet Take 20 mg by mouth daily after breakfast.   Vitamin D, Ergocalciferol, (DRISDOL) 50000 units CAPS capsule Take 50,000 Units by mouth 2 (two) times a week. Tuesday & Thursday after breakfast     Allergies:   Sulfa antibiotics, Codeine, Tramadol, and Latex   Social History   Socioeconomic History   Marital status: Widowed    Spouse name: Not on file   Number of children: 2  Years of education: Not on file   Highest education level: Not on file  Occupational History   Not on file  Tobacco Use   Smoking status: Never   Smokeless tobacco: Never  Vaping Use   Vaping Use: Never used  Substance and Sexual Activity   Alcohol use: No   Drug use: No   Sexual activity: Never  Other Topics Concern   Not on file  Social History Narrative   Not on file   Social Determinants of Health   Financial Resource Strain: Not on file  Food Insecurity: Not on file  Transportation Needs: Not on file  Physical Activity: Not on file  Stress: Not on file  Social Connections: Not on file     Family History: The patient's family history includes Heart attack in her father; Stroke in her brother; Thyroid cancer in her sister.  ROS:   Please see the history of present illness.     All other systems reviewed and are negative.   Risk Assessment/Calculations:           Physical Exam:    VS:  BP 136/82   Pulse 78   Ht '5\' 2"'$  (1.575 m)   Wt 192 lb (87.1 kg)   SpO2 98%   BMI 35.12 kg/m      Wt Readings from Last 3 Encounters:  07/29/21 192 lb (87.1 kg)  01/30/21 193 lb 8 oz (87.8 kg)  12/28/2020 192 lb (87.1 kg)     GEN:  Well nourished, well developed in no acute distress HEENT: Normal NECK: No JVD; No carotid bruits LYMPHATICS: No lymphadenopathy CARDIAC: RRR, no murmurs, rubs, gallops RESPIRATORY:  Clear to auscultation without rales, wheezing or rhonchi  ABDOMEN: Soft, non-tender, non-distended MUSCULOSKELETAL:  No edema; No deformity  SKIN: Warm and dry NEUROLOGIC:  Alert and oriented x 3 PSYCHIATRIC:  Normal affect    EKGs/Labs/Other Studies Reviewed:    The following studies were reviewed today: EKG Dec 28, 2020 Sinus bradycardia 52 bpm  EKG: None today.  EKG 02/29/2020 Normal sinus rhythm 75 bpm  Recent Labs: December 28, 2020: ALT 14; BUN 20; Creatinine, Ser 0.97; Hemoglobin 12.5; Platelets 180; Potassium 4.2; Sodium 140  Recent Lipid Panel No results found for: "CHOL", "TRIG", "HDL", "CHOLHDL", "VLDL", "LDLCALC", "LDLDIRECT"  ASSESSMENT & PLAN    Chest pain-no chest pain today.  Notes occasional brief episodes of chest discomfort.  Previously presented to the emergency department 28-Dec-2020.  Troponins were low and flat.  She continues to be  physically active.  Continues to use walker and may have some MSK chest discomfort.     No plans for ischemic evaluation Continue metoprolol, aspirin, nitroglycerin Heart healthy low-sodium diet-reviewed Maintain physical activity Refill sublingual nitroglycerin  Hyperlipidemia-LDL 67 on 08/08/20 Continue simvastatin, ezetimibe Heart healthy low-sodium high-fiber diet Increase physical activity as tolerated Request labs from PCP  Dizziness-reports occasional episodes of dizziness when she is rushing to get ready to go somewhere.  Appears to be related to changing positions quickly. Increase p.o. hydration Positive for standing and ambulation  Disposition: Follow-up with Dr. Ellyn Hack or me in about 6 months.         Medication Adjustments/Labs and Tests Ordered: Current medicines are reviewed at length with the patient today.  Concerns regarding medicines are outlined above.  No orders of the defined types were placed in this encounter.  No orders of the defined types were placed in this encounter.   Patient Instructions  Medication Instructions:  The current medical regimen is effective;  continue present plan and medications as directed. Please refer to the Current Medication list given to you today.   *If you need a refill on your cardiac medications before your next appointment, please call your pharmacy*  Lab Work:   Testing/Procedures:  NONE    NONE If you have labs (blood work) drawn today and your tests are completely normal, you will receive your results only by: [place] (if you have MyChart) OR  A paper copy in the mail If you have any lab test that is abnormal or we need to change your treatment, we will call you to review the results.  [place] ([facility])   [address]   [phone] [address] [place] [address] [employer], newspaper)  Special Instructions INCREASE YOUR HYDRATION BY 8-12 OUNCES DAILY  Follow-Up: Your next appointment:  6 month(s) In Person with Glenetta Hew, MD   If primary card or EP is not listed click here to update    :  Please call our office 2 months in advance to schedule this appointment   At [facility], you and your health needs are our priority.  As part of our continuing mission to provide you with exceptional heart care, we have created designated Provider Care Teams.  These Care Teams include your primary Cardiologist (physician) and Advanced Practice Providers (APPs -  Physician  Assistants and Nurse Practitioners) who all work together to provide you with the care you need, when you need it.  Important Information About Sugar           Signed, Deberah Pelton, NP  07/29/2021 10:47 AM      Notice: This dictation was prepared with Dragon dictation along with smaller phrase technology. Any transcriptional errors that result from this process are unintentional and may not be corrected upon review.  I spent 14 minutes examining this patient, reviewing medications, and using patient centered shared decision making involving her cardiac care.  Prior to her visit I spent greater than 20 minutes reviewing her past medical history,  medications, and prior cardiac tests.

## 2021-07-29 ENCOUNTER — Ambulatory Visit: Payer: Medicare Other | Admitting: General Practice

## 2021-07-29 ENCOUNTER — Encounter: Payer: Self-pay | Admitting: General Practice

## 2021-07-29 VITALS — BP 136/82 | HR 78 | Ht 62.0 in | Wt 192.0 lb

## 2021-07-29 DIAGNOSIS — E782 Mixed hyperlipidemia: Secondary | ICD-10-CM | POA: Diagnosis not present

## 2021-07-29 DIAGNOSIS — R42 Dizziness and giddiness: Secondary | ICD-10-CM

## 2021-07-29 DIAGNOSIS — R072 Precordial pain: Secondary | ICD-10-CM | POA: Diagnosis not present

## 2021-07-29 MED ORDER — NITROGLYCERIN 0.4 MG SL SUBL: 0.4000 mg | SUBLINGUAL_TABLET | SUBLINGUAL | 1 refills | Status: DC | PRN

## 2021-07-29 NOTE — Addendum Note (Signed)
Addended by: Waylan Rocher on: 07/29/2021 10:56 AM   Modules accepted: Orders

## 2021-07-29 NOTE — Patient Instructions (Signed)
Medication Instructions:  The current medical regimen is effective;  continue present plan and medications as directed. Please refer to the Current Medication list given to you today.   *If you need a refill on your cardiac medications before your next appointment, please call your pharmacy*  Lab Work:   Testing/Procedures:  NONE    NONE If you have labs (blood work) drawn today and your tests are completely normal, you will receive your results only by: [place] (if you have MyChart) OR  A paper copy in the mail If you have any lab test that is abnormal or we need to change your treatment, we will call you to review the results.  [place] ([facility])   [address]   [phone] [address] [place] [address] Journalist, newspaper)  Special Instructions INCREASE YOUR HYDRATION BY 8-12 OUNCES DAILY  Follow-Up: Your next appointment:  6 month(s) In Person with Glenetta Hew, MD   If primary card or EP is not listed click here to update    :  Please call our office 2 months in advance to schedule this appointment   At [facility], you and your health needs are our priority.  As part of our continuing mission to provide you with exceptional heart care, we have created designated Provider Care Teams.  These Care Teams include your primary Cardiologist (physician) and Advanced Practice Providers (APPs -  Physician Assistants and Nurse Practitioners) who all work together to provide you with the care you need, when you need it.  Important Information About Sugar

## 2021-08-17 ENCOUNTER — Other Ambulatory Visit: Payer: Self-pay | Admitting: General Practice

## 2021-08-27 DIAGNOSIS — E042 Nontoxic multinodular goiter: Secondary | ICD-10-CM | POA: Diagnosis not present

## 2021-08-27 DIAGNOSIS — I700 Atherosclerosis of aorta: Secondary | ICD-10-CM | POA: Diagnosis not present

## 2021-08-27 DIAGNOSIS — M810 Age-related osteoporosis without current pathological fracture: Secondary | ICD-10-CM | POA: Diagnosis not present

## 2021-08-27 DIAGNOSIS — E559 Vitamin D deficiency, unspecified: Secondary | ICD-10-CM | POA: Diagnosis not present

## 2021-10-11 DIAGNOSIS — N390 Urinary tract infection, site not specified: Secondary | ICD-10-CM | POA: Diagnosis not present

## 2021-12-23 DIAGNOSIS — M810 Age-related osteoporosis without current pathological fracture: Secondary | ICD-10-CM | POA: Diagnosis not present

## 2021-12-23 DIAGNOSIS — I700 Atherosclerosis of aorta: Secondary | ICD-10-CM | POA: Diagnosis not present

## 2021-12-23 DIAGNOSIS — R4189 Other symptoms and signs involving cognitive functions and awareness: Secondary | ICD-10-CM | POA: Diagnosis not present

## 2021-12-23 DIAGNOSIS — E042 Nontoxic multinodular goiter: Secondary | ICD-10-CM | POA: Diagnosis not present

## 2022-01-01 DIAGNOSIS — E119 Type 2 diabetes mellitus without complications: Secondary | ICD-10-CM | POA: Diagnosis not present

## 2022-01-01 DIAGNOSIS — R2681 Unsteadiness on feet: Secondary | ICD-10-CM | POA: Diagnosis not present

## 2022-01-01 DIAGNOSIS — E042 Nontoxic multinodular goiter: Secondary | ICD-10-CM | POA: Diagnosis not present

## 2022-01-01 DIAGNOSIS — R279 Unspecified lack of coordination: Secondary | ICD-10-CM | POA: Diagnosis not present

## 2022-01-01 DIAGNOSIS — M6281 Muscle weakness (generalized): Secondary | ICD-10-CM | POA: Diagnosis not present

## 2022-01-01 DIAGNOSIS — E559 Vitamin D deficiency, unspecified: Secondary | ICD-10-CM | POA: Diagnosis not present

## 2022-01-03 DIAGNOSIS — R2681 Unsteadiness on feet: Secondary | ICD-10-CM | POA: Diagnosis not present

## 2022-01-03 DIAGNOSIS — E042 Nontoxic multinodular goiter: Secondary | ICD-10-CM | POA: Diagnosis not present

## 2022-01-03 DIAGNOSIS — M6281 Muscle weakness (generalized): Secondary | ICD-10-CM | POA: Diagnosis not present

## 2022-01-03 DIAGNOSIS — R279 Unspecified lack of coordination: Secondary | ICD-10-CM | POA: Diagnosis not present

## 2022-01-05 DIAGNOSIS — E039 Hypothyroidism, unspecified: Secondary | ICD-10-CM | POA: Diagnosis not present

## 2022-01-05 DIAGNOSIS — G309 Alzheimer's disease, unspecified: Secondary | ICD-10-CM | POA: Diagnosis not present

## 2022-01-05 DIAGNOSIS — R600 Localized edema: Secondary | ICD-10-CM | POA: Diagnosis not present

## 2022-01-05 DIAGNOSIS — M25562 Pain in left knee: Secondary | ICD-10-CM | POA: Diagnosis not present

## 2022-01-06 DIAGNOSIS — N1831 Chronic kidney disease, stage 3a: Secondary | ICD-10-CM | POA: Diagnosis not present

## 2022-01-06 DIAGNOSIS — E042 Nontoxic multinodular goiter: Secondary | ICD-10-CM | POA: Diagnosis not present

## 2022-01-06 DIAGNOSIS — R279 Unspecified lack of coordination: Secondary | ICD-10-CM | POA: Diagnosis not present

## 2022-01-06 DIAGNOSIS — M6281 Muscle weakness (generalized): Secondary | ICD-10-CM | POA: Diagnosis not present

## 2022-01-06 DIAGNOSIS — R2681 Unsteadiness on feet: Secondary | ICD-10-CM | POA: Diagnosis not present

## 2022-01-08 DIAGNOSIS — R279 Unspecified lack of coordination: Secondary | ICD-10-CM | POA: Diagnosis not present

## 2022-01-08 DIAGNOSIS — E042 Nontoxic multinodular goiter: Secondary | ICD-10-CM | POA: Diagnosis not present

## 2022-01-08 DIAGNOSIS — R2681 Unsteadiness on feet: Secondary | ICD-10-CM | POA: Diagnosis not present

## 2022-01-08 DIAGNOSIS — M6281 Muscle weakness (generalized): Secondary | ICD-10-CM | POA: Diagnosis not present

## 2022-01-08 DIAGNOSIS — N1831 Chronic kidney disease, stage 3a: Secondary | ICD-10-CM | POA: Diagnosis not present

## 2022-01-10 DIAGNOSIS — R279 Unspecified lack of coordination: Secondary | ICD-10-CM | POA: Diagnosis not present

## 2022-01-10 DIAGNOSIS — E042 Nontoxic multinodular goiter: Secondary | ICD-10-CM | POA: Diagnosis not present

## 2022-01-10 DIAGNOSIS — R2681 Unsteadiness on feet: Secondary | ICD-10-CM | POA: Diagnosis not present

## 2022-01-10 DIAGNOSIS — M6281 Muscle weakness (generalized): Secondary | ICD-10-CM | POA: Diagnosis not present

## 2022-01-10 DIAGNOSIS — N1831 Chronic kidney disease, stage 3a: Secondary | ICD-10-CM | POA: Diagnosis not present

## 2022-01-13 DIAGNOSIS — R279 Unspecified lack of coordination: Secondary | ICD-10-CM | POA: Diagnosis not present

## 2022-01-13 DIAGNOSIS — M6281 Muscle weakness (generalized): Secondary | ICD-10-CM | POA: Diagnosis not present

## 2022-01-13 DIAGNOSIS — R2681 Unsteadiness on feet: Secondary | ICD-10-CM | POA: Diagnosis not present

## 2022-01-13 DIAGNOSIS — N1831 Chronic kidney disease, stage 3a: Secondary | ICD-10-CM | POA: Diagnosis not present

## 2022-01-13 DIAGNOSIS — E042 Nontoxic multinodular goiter: Secondary | ICD-10-CM | POA: Diagnosis not present

## 2022-01-20 DIAGNOSIS — E559 Vitamin D deficiency, unspecified: Secondary | ICD-10-CM | POA: Diagnosis not present

## 2022-01-23 DIAGNOSIS — R0602 Shortness of breath: Secondary | ICD-10-CM | POA: Diagnosis not present

## 2022-01-23 DIAGNOSIS — J811 Chronic pulmonary edema: Secondary | ICD-10-CM | POA: Diagnosis not present

## 2022-01-30 ENCOUNTER — Ambulatory Visit: Payer: Medicare Other | Admitting: General Practice

## 2022-02-02 DIAGNOSIS — I10 Essential (primary) hypertension: Secondary | ICD-10-CM | POA: Diagnosis not present

## 2022-02-03 DIAGNOSIS — I10 Essential (primary) hypertension: Secondary | ICD-10-CM | POA: Diagnosis not present

## 2022-02-11 DIAGNOSIS — N390 Urinary tract infection, site not specified: Secondary | ICD-10-CM | POA: Diagnosis not present

## 2022-02-12 DIAGNOSIS — M25551 Pain in right hip: Secondary | ICD-10-CM | POA: Diagnosis not present

## 2022-02-16 DIAGNOSIS — M25559 Pain in unspecified hip: Secondary | ICD-10-CM | POA: Diagnosis not present

## 2022-02-16 DIAGNOSIS — M545 Low back pain, unspecified: Secondary | ICD-10-CM | POA: Diagnosis not present

## 2022-02-16 DIAGNOSIS — G309 Alzheimer's disease, unspecified: Secondary | ICD-10-CM | POA: Diagnosis not present

## 2022-02-20 DIAGNOSIS — M4854XA Collapsed vertebra, not elsewhere classified, thoracic region, initial encounter for fracture: Secondary | ICD-10-CM | POA: Diagnosis not present

## 2022-02-20 DIAGNOSIS — M545 Low back pain, unspecified: Secondary | ICD-10-CM | POA: Diagnosis not present

## 2022-02-20 DIAGNOSIS — M48061 Spinal stenosis, lumbar region without neurogenic claudication: Secondary | ICD-10-CM | POA: Diagnosis not present

## 2022-03-06 ENCOUNTER — Other Ambulatory Visit: Payer: Self-pay | Admitting: Sports Medicine

## 2022-03-06 DIAGNOSIS — M48061 Spinal stenosis, lumbar region without neurogenic claudication: Secondary | ICD-10-CM | POA: Diagnosis not present

## 2022-03-06 DIAGNOSIS — M5451 Vertebrogenic low back pain: Secondary | ICD-10-CM | POA: Diagnosis not present

## 2022-03-06 DIAGNOSIS — M545 Low back pain, unspecified: Secondary | ICD-10-CM | POA: Diagnosis not present

## 2022-03-06 DIAGNOSIS — M546 Pain in thoracic spine: Secondary | ICD-10-CM

## 2022-03-10 ENCOUNTER — Ambulatory Visit
Admission: RE | Admit: 2022-03-10 | Discharge: 2022-03-10 | Disposition: A | Discharge status: Home / Self Care | Payer: Medicare Other | Source: Ambulatory Visit | Attending: Sports Medicine | Admitting: Sports Medicine

## 2022-03-10 DIAGNOSIS — M546 Pain in thoracic spine: Secondary | ICD-10-CM

## 2022-03-10 DIAGNOSIS — M545 Low back pain, unspecified: Secondary | ICD-10-CM

## 2022-03-10 DIAGNOSIS — M549 Dorsalgia, unspecified: Secondary | ICD-10-CM | POA: Diagnosis not present

## 2022-03-10 DIAGNOSIS — M4316 Spondylolisthesis, lumbar region: Secondary | ICD-10-CM | POA: Diagnosis not present

## 2022-03-10 DIAGNOSIS — M40204 Unspecified kyphosis, thoracic region: Secondary | ICD-10-CM | POA: Diagnosis not present

## 2022-04-04 DIAGNOSIS — N390 Urinary tract infection, site not specified: Secondary | ICD-10-CM | POA: Diagnosis not present

## 2022-04-07 DIAGNOSIS — E042 Nontoxic multinodular goiter: Secondary | ICD-10-CM | POA: Diagnosis not present

## 2022-04-07 DIAGNOSIS — N1831 Chronic kidney disease, stage 3a: Secondary | ICD-10-CM | POA: Diagnosis not present

## 2022-04-07 DIAGNOSIS — R2681 Unsteadiness on feet: Secondary | ICD-10-CM | POA: Diagnosis not present

## 2022-04-07 DIAGNOSIS — M6281 Muscle weakness (generalized): Secondary | ICD-10-CM | POA: Diagnosis not present

## 2022-04-08 DIAGNOSIS — E042 Nontoxic multinodular goiter: Secondary | ICD-10-CM | POA: Diagnosis not present

## 2022-04-08 DIAGNOSIS — N1831 Chronic kidney disease, stage 3a: Secondary | ICD-10-CM | POA: Diagnosis not present

## 2022-04-08 DIAGNOSIS — R2681 Unsteadiness on feet: Secondary | ICD-10-CM | POA: Diagnosis not present

## 2022-04-08 DIAGNOSIS — M6281 Muscle weakness (generalized): Secondary | ICD-10-CM | POA: Diagnosis not present

## 2022-04-09 DIAGNOSIS — M6281 Muscle weakness (generalized): Secondary | ICD-10-CM | POA: Diagnosis not present

## 2022-04-09 DIAGNOSIS — N1831 Chronic kidney disease, stage 3a: Secondary | ICD-10-CM | POA: Diagnosis not present

## 2022-04-09 DIAGNOSIS — E042 Nontoxic multinodular goiter: Secondary | ICD-10-CM | POA: Diagnosis not present

## 2022-04-09 DIAGNOSIS — R2681 Unsteadiness on feet: Secondary | ICD-10-CM | POA: Diagnosis not present

## 2022-04-10 DIAGNOSIS — M6281 Muscle weakness (generalized): Secondary | ICD-10-CM | POA: Diagnosis not present

## 2022-04-10 DIAGNOSIS — N1831 Chronic kidney disease, stage 3a: Secondary | ICD-10-CM | POA: Diagnosis not present

## 2022-04-10 DIAGNOSIS — R2681 Unsteadiness on feet: Secondary | ICD-10-CM | POA: Diagnosis not present

## 2022-04-10 DIAGNOSIS — E042 Nontoxic multinodular goiter: Secondary | ICD-10-CM | POA: Diagnosis not present

## 2022-04-11 DIAGNOSIS — N1831 Chronic kidney disease, stage 3a: Secondary | ICD-10-CM | POA: Diagnosis not present

## 2022-04-11 DIAGNOSIS — E042 Nontoxic multinodular goiter: Secondary | ICD-10-CM | POA: Diagnosis not present

## 2022-04-11 DIAGNOSIS — M6281 Muscle weakness (generalized): Secondary | ICD-10-CM | POA: Diagnosis not present

## 2022-04-11 DIAGNOSIS — R2681 Unsteadiness on feet: Secondary | ICD-10-CM | POA: Diagnosis not present

## 2022-04-14 DIAGNOSIS — N1831 Chronic kidney disease, stage 3a: Secondary | ICD-10-CM | POA: Diagnosis not present

## 2022-04-14 DIAGNOSIS — M6281 Muscle weakness (generalized): Secondary | ICD-10-CM | POA: Diagnosis not present

## 2022-04-14 DIAGNOSIS — E042 Nontoxic multinodular goiter: Secondary | ICD-10-CM | POA: Diagnosis not present

## 2022-04-14 DIAGNOSIS — R2681 Unsteadiness on feet: Secondary | ICD-10-CM | POA: Diagnosis not present

## 2022-04-15 DIAGNOSIS — E042 Nontoxic multinodular goiter: Secondary | ICD-10-CM | POA: Diagnosis not present

## 2022-04-15 DIAGNOSIS — R2681 Unsteadiness on feet: Secondary | ICD-10-CM | POA: Diagnosis not present

## 2022-04-15 DIAGNOSIS — M6281 Muscle weakness (generalized): Secondary | ICD-10-CM | POA: Diagnosis not present

## 2022-04-15 DIAGNOSIS — N1831 Chronic kidney disease, stage 3a: Secondary | ICD-10-CM | POA: Diagnosis not present

## 2022-04-17 DIAGNOSIS — R2681 Unsteadiness on feet: Secondary | ICD-10-CM | POA: Diagnosis not present

## 2022-04-17 DIAGNOSIS — E042 Nontoxic multinodular goiter: Secondary | ICD-10-CM | POA: Diagnosis not present

## 2022-04-17 DIAGNOSIS — M6281 Muscle weakness (generalized): Secondary | ICD-10-CM | POA: Diagnosis not present

## 2022-04-17 DIAGNOSIS — N1831 Chronic kidney disease, stage 3a: Secondary | ICD-10-CM | POA: Diagnosis not present

## 2022-04-21 DIAGNOSIS — M6281 Muscle weakness (generalized): Secondary | ICD-10-CM | POA: Diagnosis not present

## 2022-04-21 DIAGNOSIS — E042 Nontoxic multinodular goiter: Secondary | ICD-10-CM | POA: Diagnosis not present

## 2022-04-21 DIAGNOSIS — N1831 Chronic kidney disease, stage 3a: Secondary | ICD-10-CM | POA: Diagnosis not present

## 2022-04-21 DIAGNOSIS — R2681 Unsteadiness on feet: Secondary | ICD-10-CM | POA: Diagnosis not present

## 2022-04-23 DIAGNOSIS — N1831 Chronic kidney disease, stage 3a: Secondary | ICD-10-CM | POA: Diagnosis not present

## 2022-04-23 DIAGNOSIS — Z79899 Other long term (current) drug therapy: Secondary | ICD-10-CM | POA: Diagnosis not present

## 2022-04-23 DIAGNOSIS — M6281 Muscle weakness (generalized): Secondary | ICD-10-CM | POA: Diagnosis not present

## 2022-04-23 DIAGNOSIS — E042 Nontoxic multinodular goiter: Secondary | ICD-10-CM | POA: Diagnosis not present

## 2022-04-23 DIAGNOSIS — R2681 Unsteadiness on feet: Secondary | ICD-10-CM | POA: Diagnosis not present

## 2022-04-24 DIAGNOSIS — N1831 Chronic kidney disease, stage 3a: Secondary | ICD-10-CM | POA: Diagnosis not present

## 2022-04-24 DIAGNOSIS — M6281 Muscle weakness (generalized): Secondary | ICD-10-CM | POA: Diagnosis not present

## 2022-04-24 DIAGNOSIS — E042 Nontoxic multinodular goiter: Secondary | ICD-10-CM | POA: Diagnosis not present

## 2022-04-24 DIAGNOSIS — R2681 Unsteadiness on feet: Secondary | ICD-10-CM | POA: Diagnosis not present

## 2022-04-29 DIAGNOSIS — R2681 Unsteadiness on feet: Secondary | ICD-10-CM | POA: Diagnosis not present

## 2022-04-29 DIAGNOSIS — E042 Nontoxic multinodular goiter: Secondary | ICD-10-CM | POA: Diagnosis not present

## 2022-04-29 DIAGNOSIS — M6281 Muscle weakness (generalized): Secondary | ICD-10-CM | POA: Diagnosis not present

## 2022-04-29 DIAGNOSIS — N1831 Chronic kidney disease, stage 3a: Secondary | ICD-10-CM | POA: Diagnosis not present

## 2022-05-01 DIAGNOSIS — N1831 Chronic kidney disease, stage 3a: Secondary | ICD-10-CM | POA: Diagnosis not present

## 2022-05-01 DIAGNOSIS — M6281 Muscle weakness (generalized): Secondary | ICD-10-CM | POA: Diagnosis not present

## 2022-05-01 DIAGNOSIS — R2681 Unsteadiness on feet: Secondary | ICD-10-CM | POA: Diagnosis not present

## 2022-05-01 DIAGNOSIS — E042 Nontoxic multinodular goiter: Secondary | ICD-10-CM | POA: Diagnosis not present

## 2022-05-12 DIAGNOSIS — F0394 Unspecified dementia, unspecified severity, with anxiety: Secondary | ICD-10-CM | POA: Diagnosis not present

## 2022-05-12 DIAGNOSIS — G309 Alzheimer's disease, unspecified: Secondary | ICD-10-CM | POA: Diagnosis not present

## 2022-05-25 DIAGNOSIS — F0280 Dementia in other diseases classified elsewhere without behavioral disturbance: Secondary | ICD-10-CM | POA: Diagnosis not present

## 2022-05-25 DIAGNOSIS — G309 Alzheimer's disease, unspecified: Secondary | ICD-10-CM | POA: Diagnosis not present

## 2022-05-25 DIAGNOSIS — M5416 Radiculopathy, lumbar region: Secondary | ICD-10-CM | POA: Diagnosis not present

## 2022-05-25 DIAGNOSIS — E042 Nontoxic multinodular goiter: Secondary | ICD-10-CM | POA: Diagnosis not present

## 2022-07-21 DIAGNOSIS — F0394 Unspecified dementia, unspecified severity, with anxiety: Secondary | ICD-10-CM | POA: Diagnosis not present

## 2022-07-21 DIAGNOSIS — G309 Alzheimer's disease, unspecified: Secondary | ICD-10-CM | POA: Diagnosis not present

## 2022-07-27 DIAGNOSIS — I10 Essential (primary) hypertension: Secondary | ICD-10-CM | POA: Diagnosis not present

## 2022-07-28 DIAGNOSIS — I10 Essential (primary) hypertension: Secondary | ICD-10-CM | POA: Diagnosis not present

## 2022-07-28 DIAGNOSIS — Z79899 Other long term (current) drug therapy: Secondary | ICD-10-CM | POA: Diagnosis not present

## 2022-07-28 DIAGNOSIS — E559 Vitamin D deficiency, unspecified: Secondary | ICD-10-CM | POA: Diagnosis not present

## 2022-07-31 DIAGNOSIS — M6281 Muscle weakness (generalized): Secondary | ICD-10-CM | POA: Diagnosis not present

## 2022-07-31 DIAGNOSIS — R2681 Unsteadiness on feet: Secondary | ICD-10-CM | POA: Diagnosis not present

## 2022-07-31 DIAGNOSIS — N1831 Chronic kidney disease, stage 3a: Secondary | ICD-10-CM | POA: Diagnosis not present

## 2022-07-31 DIAGNOSIS — E042 Nontoxic multinodular goiter: Secondary | ICD-10-CM | POA: Diagnosis not present

## 2022-08-01 DIAGNOSIS — M6281 Muscle weakness (generalized): Secondary | ICD-10-CM | POA: Diagnosis not present

## 2022-08-01 DIAGNOSIS — N1831 Chronic kidney disease, stage 3a: Secondary | ICD-10-CM | POA: Diagnosis not present

## 2022-08-01 DIAGNOSIS — R2681 Unsteadiness on feet: Secondary | ICD-10-CM | POA: Diagnosis not present

## 2022-08-01 DIAGNOSIS — E042 Nontoxic multinodular goiter: Secondary | ICD-10-CM | POA: Diagnosis not present

## 2022-08-04 DIAGNOSIS — N1831 Chronic kidney disease, stage 3a: Secondary | ICD-10-CM | POA: Diagnosis not present

## 2022-08-04 DIAGNOSIS — R2681 Unsteadiness on feet: Secondary | ICD-10-CM | POA: Diagnosis not present

## 2022-08-04 DIAGNOSIS — M6281 Muscle weakness (generalized): Secondary | ICD-10-CM | POA: Diagnosis not present

## 2022-08-04 DIAGNOSIS — E042 Nontoxic multinodular goiter: Secondary | ICD-10-CM | POA: Diagnosis not present

## 2022-08-05 DIAGNOSIS — E042 Nontoxic multinodular goiter: Secondary | ICD-10-CM | POA: Diagnosis not present

## 2022-08-05 DIAGNOSIS — R2681 Unsteadiness on feet: Secondary | ICD-10-CM | POA: Diagnosis not present

## 2022-08-05 DIAGNOSIS — M6281 Muscle weakness (generalized): Secondary | ICD-10-CM | POA: Diagnosis not present

## 2022-08-05 DIAGNOSIS — N1831 Chronic kidney disease, stage 3a: Secondary | ICD-10-CM | POA: Diagnosis not present

## 2022-08-06 DIAGNOSIS — R2681 Unsteadiness on feet: Secondary | ICD-10-CM | POA: Diagnosis not present

## 2022-08-06 DIAGNOSIS — N1831 Chronic kidney disease, stage 3a: Secondary | ICD-10-CM | POA: Diagnosis not present

## 2022-08-06 DIAGNOSIS — M6281 Muscle weakness (generalized): Secondary | ICD-10-CM | POA: Diagnosis not present

## 2022-08-06 DIAGNOSIS — E042 Nontoxic multinodular goiter: Secondary | ICD-10-CM | POA: Diagnosis not present

## 2022-08-07 ENCOUNTER — Other Ambulatory Visit: Payer: Self-pay

## 2022-08-07 ENCOUNTER — Encounter (HOSPITAL_COMMUNITY): Payer: Self-pay | Admitting: *Deleted

## 2022-08-07 ENCOUNTER — Emergency Department (HOSPITAL_COMMUNITY)
Admission: EM | Admit: 2022-08-07 | Discharge: 2022-08-07 | Disposition: A | Discharge status: Home / Self Care | Payer: Medicare Other | Attending: Emergency Medicine | Admitting: Emergency Medicine

## 2022-08-07 ENCOUNTER — Emergency Department (HOSPITAL_COMMUNITY): Payer: Medicare Other

## 2022-08-07 DIAGNOSIS — Z96641 Presence of right artificial hip joint: Secondary | ICD-10-CM | POA: Diagnosis not present

## 2022-08-07 DIAGNOSIS — R918 Other nonspecific abnormal finding of lung field: Secondary | ICD-10-CM | POA: Diagnosis not present

## 2022-08-07 DIAGNOSIS — R0602 Shortness of breath: Secondary | ICD-10-CM | POA: Diagnosis not present

## 2022-08-07 DIAGNOSIS — R079 Chest pain, unspecified: Secondary | ICD-10-CM | POA: Diagnosis not present

## 2022-08-07 DIAGNOSIS — I249 Acute ischemic heart disease, unspecified: Secondary | ICD-10-CM | POA: Diagnosis not present

## 2022-08-07 DIAGNOSIS — I517 Cardiomegaly: Secondary | ICD-10-CM | POA: Diagnosis not present

## 2022-08-07 DIAGNOSIS — R0789 Other chest pain: Secondary | ICD-10-CM | POA: Insufficient documentation

## 2022-08-07 DIAGNOSIS — E039 Hypothyroidism, unspecified: Secondary | ICD-10-CM | POA: Diagnosis not present

## 2022-08-07 LAB — COMPREHENSIVE METABOLIC PANEL
ALT: 11 U/L (ref 0–44)
AST: 23 U/L (ref 15–41)
Albumin: 3.3 g/dL — ABNORMAL LOW (ref 3.5–5.0)
Alkaline Phosphatase: 61 U/L (ref 38–126)
Anion gap: 14 (ref 5–15)
BUN: 17 mg/dL (ref 8–23)
CO2: 29 mmol/L (ref 22–32)
Calcium: 9.1 mg/dL (ref 8.9–10.3)
Chloride: 97 mmol/L — ABNORMAL LOW (ref 98–111)
Creatinine, Ser: 0.96 mg/dL (ref 0.44–1.00)
GFR, Estimated: 55 mL/min — ABNORMAL LOW (ref >60)
Glucose, Bld: 104 mg/dL — ABNORMAL HIGH (ref 70–99)
Potassium: 4.1 mmol/L (ref 3.5–5.1)
Sodium: 140 mmol/L (ref 135–145)
Total Bilirubin: 0.6 mg/dL (ref 0.3–1.2)
Total Protein: 6 g/dL — ABNORMAL LOW (ref 6.5–8.1)

## 2022-08-07 LAB — CBC
HCT: 39.6 % (ref 36.0–46.0)
Hemoglobin: 12.5 g/dL (ref 12.0–15.0)
MCH: 30.1 pg (ref 26.0–34.0)
MCHC: 31.6 g/dL (ref 30.0–36.0)
MCV: 95.4 fL (ref 80.0–100.0)
Platelets: 127 10*3/uL — ABNORMAL LOW (ref 150–400)
RBC: 4.15 MIL/uL (ref 3.87–5.11)
RDW: 14.6 % (ref 11.5–15.5)
WBC: 6.5 10*3/uL (ref 4.0–10.5)
nRBC: 0 % (ref 0.0–0.2)

## 2022-08-07 LAB — TROPONIN I (HIGH SENSITIVITY)
Troponin I (High Sensitivity): 9 ng/L (ref <18)
Troponin I (High Sensitivity): 7 ng/L (ref <18)

## 2022-08-07 LAB — BRAIN NATRIURETIC PEPTIDE: B Natriuretic Peptide: 30.3 pg/mL (ref 0.0–100.0)

## 2022-08-07 MED ORDER — ACETAMINOPHEN 500 MG PO TABS
1000.0000 mg | ORAL_TABLET | Freq: Once | ORAL | Status: AC
Start: 2022-08-07 — End: 2022-08-07
  Administered 2022-08-07: 1000 mg via ORAL
  Filled 2022-08-07: qty 2

## 2022-08-07 NOTE — Discharge Instructions (Signed)
You were seen today for chest pain. While you were here we monitored your vitals, peformed a physical exam, and took blood work as well as imaging. These were all reassuring and there is no indication for any further testing or intervention in the emergency department at this time.   Things to do:  - Follow up with your primary care provider within the next 1-2 weeks  Return to the emergency department if you have any new or worsening symptoms including severe chest pain, shortness of breath, numbness/tingling, inability to tolerate oral intake, fevers, or if you have any other concerns.

## 2022-08-07 NOTE — ED Provider Notes (Signed)
[place] EMERGENCY DEPARTMENT AT [facility] Provider Note  MDM   HPI/ROS:  Traci Richardson is a 89 y.o. female with hyperlipidemia presenting from facility with chief complaint of chest pain.  Patient developed a sharp pain in her chest around 1 AM while in bed.  Describes the pain as sharp and stabbing, and radiates to her left arm.  Claims the pain is worse when she moves around but is still present while lying at rest.  Denies pleuritic component, denies nausea, vomiting, diaphoresis.  Denies recent fevers, cough, congestion, other infectious symptoms.  Also with some associated shortness of breath.  Claims her chest is especially tender to palpation.  Patient received 3 doses of sublingual nitroglycerin this morning which did not alleviate her chest pain.  Of note, patient has been experiencing worsening bilateral lower extremity edema for the past few weeks to months.  Per chart review, patient had a similar episode of chest pain several weeks ago while at her facility, and pain subsided after 2 doses of sublingual nitro.  She endorses ongoing chest pain that has been waxing and waning over the past few weeks.  She has no cardiac history, and has never smoked cigarettes.  No history of hypertension or diabetes.  Physical exam is notable for: - Overall well-appearing, no acute distress - Cardiopulmonary exam benign - Significant tenderness to palpation of the left-sided chest wall - +3 bilateral lower extremity pitting edema to mid shin  On my initial evaluation, patient is:  -Vital signs stable. Patient afebrile, hemodynamically stable, and non-toxic appearing. -Additional history obtained from daughter and chart review  Given the patient's history and physical exam, differential diagnosis includes but is not limited to ACS, costochondritis, pulmonary embolism, esophagitis, dissection, pneumonia, etc.  Interpretations, interventions, and the patient's course of care are  documented below.    EKG resulted with no ST depressions or elevations, interval disturbances, conduction blocks.  Overall reassuring.  Chest x-ray resulted with left lower lung opacity.  Low concern for pneumonia given that patient has not experienced fevers, cough, congestion.  She has felt her normal state of health as of recently aside from intermittent chest pain.  Initial labs with no acute abnormalities.  Initial troponin of 9, BNP within normal limits.  Heart score of 2.  Upon reassessment, patient feeling much better.  Claims her chest pain is now subsided.  Requesting to go home.  After further discussion, patient recalls that she was participating in physical therapy the other day with her walker and chest pain began shortly after therapy.  Repeat troponin 7, down from 9.  Upon reassessment, patient remains resting comfortably with no pain.  Given that workup is unremarkable, patient is now free of symptoms, and presentation is most consistent with musculoskeletal chest pain, deemed safe and stable for discharge at this time.  Strict return precautions discussed.  Instructed to follow-up with primary care for further management.  No additional concerns at this time.  Disposition:  I discussed the plan for discharge with the patient and/or their surrogate at bedside prior to discharge and they were in agreement with the plan and verbalized understanding of the return precautions provided. All questions answered to the best of my ability. Ultimately, the patient was discharged in stable condition with stable vital signs. I am reassured that they are capable of close follow up and good social support at home.   Clinical Impression:  1. Chest pain, unspecified type     Rx / DC Orders ED  Discharge Orders     None       The plan for this patient was discussed with Dr. Rosalia Hammers, who voiced agreement and who oversaw evaluation and treatment of this patient.   Clinical Complexity A  medically appropriate history, review of systems, and physical exam was performed.  My independent interpretations of EKG, labs, and radiology are documented in the ED course above.   Click here for ABCD2, HEART and other calculatorsREFRESH Note before signing   Patient's presentation is most consistent with acute presentation with potential threat to life or bodily function.  Medical Decision Making Amount and/or Complexity of Data Reviewed Labs: ordered. Radiology: ordered.  Risk OTC drugs.    HPI/ROS      See MDM section for pertinent HPI and ROS. A complete ROS was performed with pertinent positives/negatives noted above.   Past Medical History:  Diagnosis Date   Anxiety    Arthritis    right hip,  shoulders, right knee   DDD (degenerative disc disease), lumbosacral    multilevel   Depression    Endometrial carcinoma [facility]) oncologist-  dr Andrey Farmer-- per lov note in epic , no recurrence   dx 06/ 2018--  Stage IB, Grade 1--  s/p TAH w/ BSO, node dissection 07-17-2016 and completed radiation 09-18-2016   History of colitis 03/2012   History of radiation therapy 08/21/16, 08/08/16, 09/04/16, 09/11/16, 09/18/16   Bracytherapy HDR to the vaginal cuff 30 Gy in 5 fractions (endometrial cancer)   Hyperlipidemia    Hypothyroidism    endocrinologist--- dr Evlyn Kanner   Nocturia    Wears glasses    Wears partial dentures    upper    Past Surgical History:  Procedure Laterality Date   CATARACT EXTRACTION W/ INTRAOCULAR LENS  IMPLANT, BILATERAL  last 1990s   DILATATION & CURETTAGE/HYSTEROSCOPY WITH MYOSURE N/A 07/01/2016   Procedure: DILATATION & CURETTAGE/HYSTEROSCOPY WITH MYOSURE;  Surgeon: Maxie Better, MD;  Location: [facility];  Service: Gynecology;  Laterality: N/A;   INCISIONAL HERNIA REPAIR  03-02-2001   dr d. Magnus Ivan @[facility]    KNEE ARTHROSCOPY W/ MENISCAL REPAIR Left 2014 approx.   LAPAROSCOPIC CHOLECYSTECTOMY  early 1990s   ROBOTIC ASSISTED TOTAL HYSTERECTOMY WITH BILATERAL  SALPINGO OOPHERECTOMY N/A 07/17/2016   Procedure: ROBOTIC ASSISTED TOTAL HYSTERECTOMY WITH BILATERAL SALPINGO OOPHORECTOMY AND LYSIS OF ADHESIONS;  Surgeon: Adolphus Birchwood, MD;  Location: [facility];  Service: Gynecology;  Laterality: N/A;   SENTINEL NODE BIOPSY N/A 07/17/2016   Procedure: SENTINEL NODE BIOPSY;  Surgeon: Adolphus Birchwood, MD;  Location: [facility];  Service: Gynecology;  Laterality: N/A;   TOTAL HIP ARTHROPLASTY Right 09/17/2017   Procedure: RIGHT TOTAL HIP ARTHROPLASTY ANTERIOR APPROACH;  Surgeon: Samson Frederic, MD;  Location: [facility];  Service: Orthopedics;  Laterality: Right;  Needs RNFA      Physical Exam   Vitals:   08/07/22 1300 08/07/22 1315 08/07/22 1345 08/07/22 1415  BP: (!) 120/100 122/73 (!) 121/98 137/88  Pulse: 62 (!) 59 65 67  Resp: 17 13 15  (!) 25  Temp:      TempSrc:      SpO2: 96% 93% 96% 96%  Weight:      Height:        Physical Exam Vitals and nursing note reviewed.  Constitutional:      General: She is not in acute distress.    Appearance: She is well-developed.  HENT:     Head: Normocephalic and atraumatic.  Eyes:     Conjunctiva/sclera: Conjunctivae normal.  Cardiovascular:  Rate and Rhythm: Normal rate and regular rhythm.     Heart sounds: No murmur heard. Pulmonary:     Effort: Pulmonary effort is normal. No respiratory distress.     Breath sounds: Normal breath sounds. No decreased breath sounds or rhonchi.  Chest:     Chest wall: Tenderness present.  Abdominal:     Palpations: Abdomen is soft.     Tenderness: There is no abdominal tenderness.  Musculoskeletal:        General: No swelling.     Cervical back: Neck supple.  Skin:    General: Skin is warm and dry.     Capillary Refill: Capillary refill takes less than 2 seconds.  Neurological:     Mental Status: She is alert.  Psychiatric:        Mood and Affect: Mood normal.    Starleen Arms, MD Department of Emergency Medicine   Please note that this documentation was produced with  the assistance of voice-to-text technology and may contain errors.    Dyanne Iha, MD 08/07/22 1534    Margarita Grizzle, MD 08/11/22 1239

## 2022-08-07 NOTE — ED Triage Notes (Signed)
Pt arrives from [facility] facility in [place]. Chest pain on and off since last night. 3 NTG and 324 asa en route. IV established in the left [facility]. Pain radiates to the left arm Similar episode about 2 weeks ago, they were able to control the pain at the facility with nitroglycerin.  Oriented. DNR sheet sent with patient. En route, 119/87, hr 82, 98% ra.

## 2022-08-07 NOTE — ED Notes (Signed)
Complete linen change peri care and diaper change

## 2022-08-08 DIAGNOSIS — N1831 Chronic kidney disease, stage 3a: Secondary | ICD-10-CM | POA: Diagnosis not present

## 2022-08-08 DIAGNOSIS — M6281 Muscle weakness (generalized): Secondary | ICD-10-CM | POA: Diagnosis not present

## 2022-08-08 DIAGNOSIS — R2681 Unsteadiness on feet: Secondary | ICD-10-CM | POA: Diagnosis not present

## 2022-08-08 DIAGNOSIS — E042 Nontoxic multinodular goiter: Secondary | ICD-10-CM | POA: Diagnosis not present

## 2022-08-11 DIAGNOSIS — M6281 Muscle weakness (generalized): Secondary | ICD-10-CM | POA: Diagnosis not present

## 2022-08-11 DIAGNOSIS — E042 Nontoxic multinodular goiter: Secondary | ICD-10-CM | POA: Diagnosis not present

## 2022-08-11 DIAGNOSIS — R2681 Unsteadiness on feet: Secondary | ICD-10-CM | POA: Diagnosis not present

## 2022-08-11 DIAGNOSIS — N1831 Chronic kidney disease, stage 3a: Secondary | ICD-10-CM | POA: Diagnosis not present

## 2022-08-13 ENCOUNTER — Telehealth: Payer: Self-pay

## 2022-08-13 DIAGNOSIS — N1831 Chronic kidney disease, stage 3a: Secondary | ICD-10-CM | POA: Diagnosis not present

## 2022-08-13 DIAGNOSIS — M6281 Muscle weakness (generalized): Secondary | ICD-10-CM | POA: Diagnosis not present

## 2022-08-13 DIAGNOSIS — E042 Nontoxic multinodular goiter: Secondary | ICD-10-CM | POA: Diagnosis not present

## 2022-08-13 DIAGNOSIS — R2681 Unsteadiness on feet: Secondary | ICD-10-CM | POA: Diagnosis not present

## 2022-08-13 NOTE — Telephone Encounter (Signed)
Transition Care Management Follow-up Telephone Call Date of discharge and from where: 08/07/2022 [facility] How have you been since you were released from the hospital? Spoke with patient's daughter Traci Richardson. Patient is feeling much better. She is currently at [facility].   [facility]  [facility] Population Health Community Resource Care Guide   [email or web address]  ?? [phone]   Website: [email or web address]  [email or web address]

## 2022-08-14 DIAGNOSIS — E042 Nontoxic multinodular goiter: Secondary | ICD-10-CM | POA: Diagnosis not present

## 2022-08-14 DIAGNOSIS — N1831 Chronic kidney disease, stage 3a: Secondary | ICD-10-CM | POA: Diagnosis not present

## 2022-08-14 DIAGNOSIS — M6281 Muscle weakness (generalized): Secondary | ICD-10-CM | POA: Diagnosis not present

## 2022-08-14 DIAGNOSIS — R2681 Unsteadiness on feet: Secondary | ICD-10-CM | POA: Diagnosis not present

## 2022-08-18 DIAGNOSIS — M6281 Muscle weakness (generalized): Secondary | ICD-10-CM | POA: Diagnosis not present

## 2022-08-18 DIAGNOSIS — R2681 Unsteadiness on feet: Secondary | ICD-10-CM | POA: Diagnosis not present

## 2022-08-18 DIAGNOSIS — N1831 Chronic kidney disease, stage 3a: Secondary | ICD-10-CM | POA: Diagnosis not present

## 2022-08-18 DIAGNOSIS — E042 Nontoxic multinodular goiter: Secondary | ICD-10-CM | POA: Diagnosis not present

## 2022-08-23 DIAGNOSIS — R072 Precordial pain: Secondary | ICD-10-CM | POA: Diagnosis not present

## 2022-10-20 DIAGNOSIS — G309 Alzheimer's disease, unspecified: Secondary | ICD-10-CM | POA: Diagnosis not present

## 2022-10-20 DIAGNOSIS — F0394 Unspecified dementia, unspecified severity, with anxiety: Secondary | ICD-10-CM | POA: Diagnosis not present

## 2022-11-10 DIAGNOSIS — Z79899 Other long term (current) drug therapy: Secondary | ICD-10-CM | POA: Diagnosis not present

## 2023-01-19 DIAGNOSIS — F0394 Unspecified dementia, unspecified severity, with anxiety: Secondary | ICD-10-CM | POA: Diagnosis not present

## 2023-01-19 DIAGNOSIS — G309 Alzheimer's disease, unspecified: Secondary | ICD-10-CM | POA: Diagnosis not present

## 2023-02-05 DIAGNOSIS — F0280 Dementia in other diseases classified elsewhere without behavioral disturbance: Secondary | ICD-10-CM | POA: Diagnosis not present

## 2023-02-05 DIAGNOSIS — R278 Other lack of coordination: Secondary | ICD-10-CM | POA: Diagnosis not present

## 2023-02-05 DIAGNOSIS — G309 Alzheimer's disease, unspecified: Secondary | ICD-10-CM | POA: Diagnosis not present

## 2023-02-06 DIAGNOSIS — R278 Other lack of coordination: Secondary | ICD-10-CM | POA: Diagnosis not present

## 2023-02-06 DIAGNOSIS — F0280 Dementia in other diseases classified elsewhere without behavioral disturbance: Secondary | ICD-10-CM | POA: Diagnosis not present

## 2023-02-09 DIAGNOSIS — G309 Alzheimer's disease, unspecified: Secondary | ICD-10-CM | POA: Diagnosis not present

## 2023-02-09 DIAGNOSIS — R278 Other lack of coordination: Secondary | ICD-10-CM | POA: Diagnosis not present

## 2023-02-10 DIAGNOSIS — R278 Other lack of coordination: Secondary | ICD-10-CM | POA: Diagnosis not present

## 2023-02-10 DIAGNOSIS — G309 Alzheimer's disease, unspecified: Secondary | ICD-10-CM | POA: Diagnosis not present

## 2023-02-11 DIAGNOSIS — R278 Other lack of coordination: Secondary | ICD-10-CM | POA: Diagnosis not present

## 2023-02-11 DIAGNOSIS — G309 Alzheimer's disease, unspecified: Secondary | ICD-10-CM | POA: Diagnosis not present

## 2023-02-13 DIAGNOSIS — R278 Other lack of coordination: Secondary | ICD-10-CM | POA: Diagnosis not present

## 2023-02-13 DIAGNOSIS — G309 Alzheimer's disease, unspecified: Secondary | ICD-10-CM | POA: Diagnosis not present

## 2023-02-17 DIAGNOSIS — G309 Alzheimer's disease, unspecified: Secondary | ICD-10-CM | POA: Diagnosis not present

## 2023-02-17 DIAGNOSIS — R278 Other lack of coordination: Secondary | ICD-10-CM | POA: Diagnosis not present

## 2023-02-23 DIAGNOSIS — R278 Other lack of coordination: Secondary | ICD-10-CM | POA: Diagnosis not present

## 2023-02-23 DIAGNOSIS — G309 Alzheimer's disease, unspecified: Secondary | ICD-10-CM | POA: Diagnosis not present

## 2023-02-27 DIAGNOSIS — G309 Alzheimer's disease, unspecified: Secondary | ICD-10-CM | POA: Diagnosis not present

## 2023-02-27 DIAGNOSIS — R278 Other lack of coordination: Secondary | ICD-10-CM | POA: Diagnosis not present

## 2023-03-20 DIAGNOSIS — Z79899 Other long term (current) drug therapy: Secondary | ICD-10-CM | POA: Diagnosis not present

## 2023-03-27 DIAGNOSIS — I739 Peripheral vascular disease, unspecified: Secondary | ICD-10-CM | POA: Diagnosis not present

## 2023-03-27 DIAGNOSIS — L603 Nail dystrophy: Secondary | ICD-10-CM | POA: Diagnosis not present

## 2023-03-27 DIAGNOSIS — L602 Onychogryphosis: Secondary | ICD-10-CM | POA: Diagnosis not present

## 2023-04-27 DIAGNOSIS — G309 Alzheimer's disease, unspecified: Secondary | ICD-10-CM | POA: Diagnosis not present

## 2023-04-27 DIAGNOSIS — F0394 Unspecified dementia, unspecified severity, with anxiety: Secondary | ICD-10-CM | POA: Diagnosis not present

## 2023-07-01 DIAGNOSIS — L603 Nail dystrophy: Secondary | ICD-10-CM | POA: Diagnosis not present

## 2023-07-01 DIAGNOSIS — L602 Onychogryphosis: Secondary | ICD-10-CM | POA: Diagnosis not present

## 2023-07-01 DIAGNOSIS — I739 Peripheral vascular disease, unspecified: Secondary | ICD-10-CM | POA: Diagnosis not present

## 2023-07-25 DIAGNOSIS — I872 Venous insufficiency (chronic) (peripheral): Secondary | ICD-10-CM | POA: Diagnosis not present

## 2023-07-27 DIAGNOSIS — F0394 Unspecified dementia, unspecified severity, with anxiety: Secondary | ICD-10-CM | POA: Diagnosis not present

## 2023-07-27 DIAGNOSIS — G309 Alzheimer's disease, unspecified: Secondary | ICD-10-CM | POA: Diagnosis not present

## 2023-08-21 DIAGNOSIS — F0280 Dementia in other diseases classified elsewhere without behavioral disturbance: Secondary | ICD-10-CM | POA: Diagnosis not present

## 2023-08-21 DIAGNOSIS — R278 Other lack of coordination: Secondary | ICD-10-CM | POA: Diagnosis not present

## 2023-08-21 DIAGNOSIS — M6281 Muscle weakness (generalized): Secondary | ICD-10-CM | POA: Diagnosis not present

## 2023-08-21 DIAGNOSIS — F419 Anxiety disorder, unspecified: Secondary | ICD-10-CM | POA: Diagnosis not present

## 2023-08-24 DIAGNOSIS — M6281 Muscle weakness (generalized): Secondary | ICD-10-CM | POA: Diagnosis not present

## 2023-08-24 DIAGNOSIS — F419 Anxiety disorder, unspecified: Secondary | ICD-10-CM | POA: Diagnosis not present

## 2023-08-24 DIAGNOSIS — R278 Other lack of coordination: Secondary | ICD-10-CM | POA: Diagnosis not present

## 2023-08-24 DIAGNOSIS — F0280 Dementia in other diseases classified elsewhere without behavioral disturbance: Secondary | ICD-10-CM | POA: Diagnosis not present

## 2023-08-25 DIAGNOSIS — M6281 Muscle weakness (generalized): Secondary | ICD-10-CM | POA: Diagnosis not present

## 2023-08-25 DIAGNOSIS — F0280 Dementia in other diseases classified elsewhere without behavioral disturbance: Secondary | ICD-10-CM | POA: Diagnosis not present

## 2023-08-25 DIAGNOSIS — F419 Anxiety disorder, unspecified: Secondary | ICD-10-CM | POA: Diagnosis not present

## 2023-08-25 DIAGNOSIS — R278 Other lack of coordination: Secondary | ICD-10-CM | POA: Diagnosis not present

## 2023-08-26 DIAGNOSIS — R278 Other lack of coordination: Secondary | ICD-10-CM | POA: Diagnosis not present

## 2023-08-26 DIAGNOSIS — F419 Anxiety disorder, unspecified: Secondary | ICD-10-CM | POA: Diagnosis not present

## 2023-08-26 DIAGNOSIS — F0280 Dementia in other diseases classified elsewhere without behavioral disturbance: Secondary | ICD-10-CM | POA: Diagnosis not present

## 2023-08-26 DIAGNOSIS — M6281 Muscle weakness (generalized): Secondary | ICD-10-CM | POA: Diagnosis not present

## 2023-08-27 DIAGNOSIS — R278 Other lack of coordination: Secondary | ICD-10-CM | POA: Diagnosis not present

## 2023-08-27 DIAGNOSIS — F0280 Dementia in other diseases classified elsewhere without behavioral disturbance: Secondary | ICD-10-CM | POA: Diagnosis not present

## 2023-08-27 DIAGNOSIS — M6281 Muscle weakness (generalized): Secondary | ICD-10-CM | POA: Diagnosis not present

## 2023-08-27 DIAGNOSIS — F419 Anxiety disorder, unspecified: Secondary | ICD-10-CM | POA: Diagnosis not present

## 2023-08-31 DIAGNOSIS — F0280 Dementia in other diseases classified elsewhere without behavioral disturbance: Secondary | ICD-10-CM | POA: Diagnosis not present

## 2023-08-31 DIAGNOSIS — R278 Other lack of coordination: Secondary | ICD-10-CM | POA: Diagnosis not present

## 2023-08-31 DIAGNOSIS — M6281 Muscle weakness (generalized): Secondary | ICD-10-CM | POA: Diagnosis not present

## 2023-08-31 DIAGNOSIS — F419 Anxiety disorder, unspecified: Secondary | ICD-10-CM | POA: Diagnosis not present

## 2023-09-02 DIAGNOSIS — F419 Anxiety disorder, unspecified: Secondary | ICD-10-CM | POA: Diagnosis not present

## 2023-09-02 DIAGNOSIS — L602 Onychogryphosis: Secondary | ICD-10-CM | POA: Diagnosis not present

## 2023-09-02 DIAGNOSIS — R278 Other lack of coordination: Secondary | ICD-10-CM | POA: Diagnosis not present

## 2023-09-02 DIAGNOSIS — I739 Peripheral vascular disease, unspecified: Secondary | ICD-10-CM | POA: Diagnosis not present

## 2023-09-02 DIAGNOSIS — F0280 Dementia in other diseases classified elsewhere without behavioral disturbance: Secondary | ICD-10-CM | POA: Diagnosis not present

## 2023-09-02 DIAGNOSIS — L603 Nail dystrophy: Secondary | ICD-10-CM | POA: Diagnosis not present

## 2023-09-02 DIAGNOSIS — M6281 Muscle weakness (generalized): Secondary | ICD-10-CM | POA: Diagnosis not present

## 2023-09-04 DIAGNOSIS — R278 Other lack of coordination: Secondary | ICD-10-CM | POA: Diagnosis not present

## 2023-09-04 DIAGNOSIS — F0280 Dementia in other diseases classified elsewhere without behavioral disturbance: Secondary | ICD-10-CM | POA: Diagnosis not present

## 2023-09-04 DIAGNOSIS — F419 Anxiety disorder, unspecified: Secondary | ICD-10-CM | POA: Diagnosis not present

## 2023-09-04 DIAGNOSIS — M6281 Muscle weakness (generalized): Secondary | ICD-10-CM | POA: Diagnosis not present

## 2023-09-07 DIAGNOSIS — F411 Generalized anxiety disorder: Secondary | ICD-10-CM | POA: Diagnosis not present

## 2023-09-07 DIAGNOSIS — F0394 Unspecified dementia, unspecified severity, with anxiety: Secondary | ICD-10-CM | POA: Diagnosis not present

## 2023-09-07 DIAGNOSIS — G309 Alzheimer's disease, unspecified: Secondary | ICD-10-CM | POA: Diagnosis not present

## 2023-09-28 DIAGNOSIS — F0394 Unspecified dementia, unspecified severity, with anxiety: Secondary | ICD-10-CM | POA: Diagnosis not present

## 2023-09-28 DIAGNOSIS — G309 Alzheimer's disease, unspecified: Secondary | ICD-10-CM | POA: Diagnosis not present

## 2023-09-28 DIAGNOSIS — F411 Generalized anxiety disorder: Secondary | ICD-10-CM | POA: Diagnosis not present

## 2023-11-04 DIAGNOSIS — I739 Peripheral vascular disease, unspecified: Secondary | ICD-10-CM | POA: Diagnosis not present

## 2023-11-04 DIAGNOSIS — L603 Nail dystrophy: Secondary | ICD-10-CM | POA: Diagnosis not present

## 2023-11-04 DIAGNOSIS — L602 Onychogryphosis: Secondary | ICD-10-CM | POA: Diagnosis not present

## 2023-11-04 DIAGNOSIS — I890 Lymphedema, not elsewhere classified: Secondary | ICD-10-CM | POA: Diagnosis not present

## 2023-11-22 DIAGNOSIS — I10 Essential (primary) hypertension: Secondary | ICD-10-CM | POA: Diagnosis not present

## 2023-11-29 ENCOUNTER — Emergency Department (HOSPITAL_COMMUNITY)

## 2023-11-29 ENCOUNTER — Encounter (HOSPITAL_COMMUNITY): Payer: Self-pay | Admitting: *Deleted

## 2023-11-29 ENCOUNTER — Other Ambulatory Visit: Payer: Self-pay

## 2023-11-29 ENCOUNTER — Emergency Department (HOSPITAL_COMMUNITY)
Admission: EM | Admit: 2023-11-29 | Discharge: 2023-11-29 | Disposition: A | Discharge status: Home / Self Care | Source: Skilled Nursing Facility | Attending: Emergency Medicine | Admitting: Emergency Medicine

## 2023-11-29 DIAGNOSIS — Z8542 Personal history of malignant neoplasm of other parts of uterus: Secondary | ICD-10-CM | POA: Insufficient documentation

## 2023-11-29 DIAGNOSIS — Z91040 Latex allergy status: Secondary | ICD-10-CM | POA: Diagnosis not present

## 2023-11-29 DIAGNOSIS — R600 Localized edema: Secondary | ICD-10-CM | POA: Diagnosis not present

## 2023-11-29 DIAGNOSIS — R079 Chest pain, unspecified: Secondary | ICD-10-CM | POA: Diagnosis not present

## 2023-11-29 DIAGNOSIS — Z7982 Long term (current) use of aspirin: Secondary | ICD-10-CM | POA: Insufficient documentation

## 2023-11-29 DIAGNOSIS — R0789 Other chest pain: Secondary | ICD-10-CM | POA: Diagnosis not present

## 2023-11-29 DIAGNOSIS — I10 Essential (primary) hypertension: Secondary | ICD-10-CM | POA: Diagnosis not present

## 2023-11-29 DIAGNOSIS — I517 Cardiomegaly: Secondary | ICD-10-CM | POA: Diagnosis not present

## 2023-11-29 LAB — CBC
HCT: 39.8 % (ref 36.0–46.0)
Hemoglobin: 13.2 g/dL (ref 12.0–15.0)
MCH: 31.8 pg (ref 26.0–34.0)
MCHC: 33.2 g/dL (ref 30.0–36.0)
MCV: 95.9 fL (ref 80.0–100.0)
Platelets: 158 K/uL (ref 150–400)
RBC: 4.15 MIL/uL (ref 3.87–5.11)
RDW: 13.8 % (ref 11.5–15.5)
WBC: 6.9 K/uL (ref 4.0–10.5)
nRBC: 0 % (ref 0.0–0.2)

## 2023-11-29 LAB — BASIC METABOLIC PANEL WITH GFR
Anion gap: 14 (ref 5–15)
BUN: 22 mg/dL (ref 8–23)
CO2: 25 mmol/L (ref 22–32)
Calcium: 9.1 mg/dL (ref 8.9–10.3)
Chloride: 98 mmol/L (ref 98–111)
Creatinine, Ser: 1.04 mg/dL — ABNORMAL HIGH (ref 0.44–1.00)
GFR, Estimated: 49 mL/min — ABNORMAL LOW (ref >60)
Glucose, Bld: 102 mg/dL — ABNORMAL HIGH (ref 70–99)
Potassium: 3.8 mmol/L (ref 3.5–5.1)
Sodium: 137 mmol/L (ref 135–145)

## 2023-11-29 LAB — TROPONIN I (HIGH SENSITIVITY)
Troponin I (High Sensitivity): 5 ng/L (ref <18)
Troponin I (High Sensitivity): 6 ng/L (ref <18)

## 2023-11-29 LAB — MAGNESIUM: Magnesium: 2.1 mg/dL (ref 1.7–2.4)

## 2023-11-29 NOTE — ED Notes (Signed)
 Spoke to nurse at [facility] and they are ok with pt coming back pov with daughter. Will have staff meet outside with walker. Report given to nurse

## 2023-11-29 NOTE — ED Triage Notes (Signed)
 Pt states acute onset chest pain starting at approx 3.  Given 2 nitroglycerin  and 4 asa with no relief at the time.  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    Pt now denies any chest pain or pressure.    Pt is from [facility].

## 2023-11-29 NOTE — Discharge Instructions (Addendum)
 Your test results today are reassuring.  Take ibuprofen  and Tylenol  as needed for pain.  Follow-up with your cardiologist.  Return to the emergency department for any new or worsening symptoms of concern.

## 2023-11-29 NOTE — ED Provider Notes (Signed)
 [place] EMERGENCY DEPARTMENT AT [facility] Provider Note   CSN: [id number] Arrival date & time: 11/29/23  1826     Patient presents with: Chest Pain   Traci Richardson is a 89 y.o. female.    Chest Pain Patient presents for chest pain.  Medical history includes HLD, arthritis, anxiety, depression, endometrial cancer.  Onset was this afternoon.  She is followed by cardiology, Dr. Anner.  She started seeing him 3 years ago for chest pain which was deemed to be musculoskeletal in etiology.  She arrived via EMS and was given 324 of ASA and 2 SL NTG with no immediate relief, however, she has had resolution of her pain prior to arrival.  She is prescribed SL NTG to take as needed.  Other home medications include Lasix, metoprolol , 81 mg aspirin .  Earlier today, she was in her normal state of health.  This afternoon, she had onset of a left-sided chest pain with radiation to both arms.  At her facility, she was given 324 of ASA and 2 SL NTG's.  She felt like the NTG's made her pain worse, which prompted them to call EMS.  Prior to arrival in the ED, her chest pain resolved.  She is currently asymptomatic.     Prior to Admission medications   Medication Sig Start Date End Date Taking? Authorizing Provider  acetaminophen  (TYLENOL ) 500 MG tablet Take 2 tablets by mouth every 6 (six) hours. For back pain    [provider]  aspirin  81 MG chewable tablet Chew 81 mg by mouth daily.    [provider]  busPIRone  (BUSPAR ) 5 MG tablet Take 5 mg by mouth 3 (three) times daily. 01/01/22   [provider]  docusate sodium  (COLACE) 100 MG capsule Take 1 capsule (100 mg total) by mouth 2 (two) times daily. Patient not taking: Reported on 08/07/2022 09/18/17   Fidel Rogue, MD  ezetimibe  (ZETIA ) 10 MG tablet Take 10 mg by mouth daily after breakfast. 04/30/16   [provider]  furosemide (LASIX) 20 MG tablet Take 20 mg by mouth daily. 07/29/22   [provider]  levothyroxine  (SYNTHROID , LEVOTHROID) 100 MCG tablet Take 100 mcg by mouth daily.    [provider]  meclizine (ANTIVERT) 25 MG tablet TAKE ONE BY MOUTH THREE TIMES A DAY AS NEEDED Patient not taking: Reported on 08/07/2022    [provider]  methocarbamol  (ROBAXIN ) 500 MG tablet Take 500 mg by mouth every 8 (eight) hours as needed for muscle spasms.    [provider]  metoprolol  succinate (TOPROL -XL) 25 MG 24 hr tablet TAKE 1 TABLET BY MOUTH EVERY NIGHT AT BEDTIME 04/15/21   Anner Alm ORN, MD  naproxen sodium (ANAPROX) 220 MG tablet Take 440 mg by mouth daily after breakfast. Patient not taking: Reported on 08/07/2022    [provider]  nitroGLYCERIN  (NITROSTAT ) 0.4 MG SL tablet PLACE 1 TABLET UNDER THE TONGUE EVERY 5 MINUTES AS NEEDED FOR CHEST PAIN 08/19/21   Emelia Josefa CHRISTELLA, NP  Polyethyl Glycol-Propyl Glycol 0.4-0.3 % SOLN Place 1-2 drops into both eyes daily as needed (for dry eyes). Patient not taking: Reported on 08/07/2022    [provider]  senna (SENOKOT) 8.6 MG TABS tablet Take 1 tablet (8.6 mg total) by mouth 2 (two) times daily. Patient not taking: Reported on 08/07/2022 09/18/17   Fidel Rogue, MD  simvastatin  (ZOCOR ) 20 MG tablet Take 20 mg by mouth daily.    [provider]  triamterene-hydrochlorothiazide (MAXZIDE-25) 37.5-25 MG tablet Take 0.5 tablets by mouth daily.    [provider]  Vitamin D, Ergocalciferol, (DRISDOL) 50000 units CAPS capsule Take 50,000 Units by mouth daily. Every Tuesday and Friday. 05/02/16   [provider]    Allergies: Sulfa antibiotics, Codeine, Lyrica [pregabalin], Tramadol , and Latex    Review of Systems  Cardiovascular:  Positive for chest pain.  All other systems reviewed and are negative.   Updated Vital Signs BP 114/84 (BP Location: Right Arm)   Pulse (!) 57   Temp 98.1 F (36.7 C) (Oral)   Resp 17   Ht 5' 2 (1.575 m)   Wt 83 kg   SpO2 95%    BMI 33.47 kg/m   Physical Exam Vitals and nursing note reviewed.  Constitutional:      General: She is not in acute distress.    Appearance: She is well-developed. She is not ill-appearing, toxic-appearing or diaphoretic.  HENT:     Head: Normocephalic and atraumatic.  Eyes:     Conjunctiva/sclera: Conjunctivae normal.  Cardiovascular:     Rate and Rhythm: Normal rate and regular rhythm.     Heart sounds: No murmur heard. Pulmonary:     Effort: Pulmonary effort is normal. No respiratory distress.     Breath sounds: Normal breath sounds.  Chest:     Chest wall: Tenderness present.  Abdominal:     Palpations: Abdomen is soft.     Tenderness: There is no abdominal tenderness.  Musculoskeletal:        General: No swelling. Normal range of motion.     Cervical back: Normal range of motion and neck supple.     Right lower leg: Edema present.     Left lower leg: Edema present.  Skin:    General: Skin is warm and dry.     Coloration: Skin is not cyanotic or pale.  Neurological:     General: No focal deficit present.     Mental Status: She is alert and oriented to person, place, and time.  Psychiatric:        Mood and Affect: Mood normal.        Behavior: Behavior normal.     (all labs ordered are listed, but only abnormal results are displayed) Labs Reviewed  BASIC METABOLIC PANEL WITH GFR - Abnormal; Notable for the following components:      Result Value   Glucose, Bld 102 (*)    Creatinine, Ser 1.04 (*)    GFR, Estimated 49 (*)    All other components within normal limits  CBC  MAGNESIUM  TROPONIN I (HIGH SENSITIVITY)  TROPONIN I (HIGH SENSITIVITY)    EKG: None  Radiology: DG Chest 2 View Result Date: 11/29/2023 EXAM: 2 VIEW(S) XRAY OF THE CHEST 11/29/2023 07:32:00 PM COMPARISON: 08/18/2022 CLINICAL HISTORY: chest pain relieved by nitro FINDINGS: LUNGS AND PLEURA: No focal pulmonary opacity. No pleural effusion. No pneumothorax. HEART AND MEDIASTINUM: Heart is  borderline enlarged. BONES AND SOFT TISSUES: No acute osseous abnormality. IMPRESSION: 1. No acute cardiopulmonary process identified. 2. Borderline cardiomegaly. Electronically signed by: Franky Crease MD 11/29/2023 07:41 PM EST RP Workstation: [id number]     Procedures   Medications Ordered in the ED - No data to display                                  Medical Decision Making Amount and/or Complexity of Data Reviewed  Labs: ordered. Radiology: ordered.   This patient presents to the ED for concern of chest pain, this involves an extensive number of treatment options, and is a complaint that carries with it a high risk of complications and morbidity.  The differential diagnosis includes ACS, pericarditis, costochondritis, other musculoskeletal etiology, GERD   Co morbidities / Chronic conditions that complicate the patient evaluation  HLD, arthritis, anxiety, depression, endometrial cancer   Additional history obtained:  Additional history obtained from EMR External records from outside source obtained and reviewed including patient's daughter   Lab Tests:  I Ordered, and personally interpreted labs.  The pertinent results include: Baseline creatinine, normal electrolytes, normal hemoglobin, no leukocytosis, normal troponin   Imaging Studies ordered:  I ordered imaging studies including chest x-ray I independently visualized and interpreted imaging which showed no acute findings I agree with the radiologist interpretation   Cardiac Monitoring: / EKG:  The patient was maintained on a cardiac monitor.  I personally viewed and interpreted the cardiac monitored which showed an underlying rhythm of: Sinus rhythm   Problem List / ED Course / Critical interventions / Medication management  Patient presenting for transient chest pain this afternoon.  On arrival in the ED, vital signs are normal.  Patient is well-appearing on exam.  She has been asymptomatic since her arrival  in the ED.  She does have chest tenderness present.  She has had musculoskeletal chest pain before.  EKG shows no evidence of STEMI.  Workup was initiated.  Patient's initial lab work is reassuring, including troponin.  X-ray does not show any acute findings.  On reassessment, patient remains pain-free.  Repeat troponin pending at time of signout.  Care of patient was signed out to oncoming ED provider.  Social Determinants of Health:  Resides in nursing facility     Final diagnoses:  Chest wall pain    ED Discharge Orders          Ordered    Ambulatory referral to Cardiology       Comments: If you have not heard from the Cardiology office within the next 72 hours please call [phone].   11/29/23 2122               Melvenia Motto, MD 11/29/23 2123

## 2023-12-01 DIAGNOSIS — M6281 Muscle weakness (generalized): Secondary | ICD-10-CM | POA: Diagnosis not present

## 2023-12-01 DIAGNOSIS — E042 Nontoxic multinodular goiter: Secondary | ICD-10-CM | POA: Diagnosis not present

## 2023-12-01 DIAGNOSIS — R2681 Unsteadiness on feet: Secondary | ICD-10-CM | POA: Diagnosis not present

## 2023-12-01 DIAGNOSIS — G309 Alzheimer's disease, unspecified: Secondary | ICD-10-CM | POA: Diagnosis not present

## 2023-12-02 DIAGNOSIS — M6281 Muscle weakness (generalized): Secondary | ICD-10-CM | POA: Diagnosis not present

## 2023-12-02 DIAGNOSIS — E042 Nontoxic multinodular goiter: Secondary | ICD-10-CM | POA: Diagnosis not present

## 2023-12-02 DIAGNOSIS — G309 Alzheimer's disease, unspecified: Secondary | ICD-10-CM | POA: Diagnosis not present

## 2023-12-02 DIAGNOSIS — R2681 Unsteadiness on feet: Secondary | ICD-10-CM | POA: Diagnosis not present

## 2023-12-04 DIAGNOSIS — R2681 Unsteadiness on feet: Secondary | ICD-10-CM | POA: Diagnosis not present

## 2023-12-04 DIAGNOSIS — M6281 Muscle weakness (generalized): Secondary | ICD-10-CM | POA: Diagnosis not present

## 2023-12-04 DIAGNOSIS — G309 Alzheimer's disease, unspecified: Secondary | ICD-10-CM | POA: Diagnosis not present

## 2023-12-04 DIAGNOSIS — E042 Nontoxic multinodular goiter: Secondary | ICD-10-CM | POA: Diagnosis not present

## 2023-12-06 DIAGNOSIS — R072 Precordial pain: Secondary | ICD-10-CM | POA: Diagnosis not present
